# Patient Record
Sex: Male | Born: 1990 | Race: Black or African American | Hispanic: No | Marital: Single | Smoking: Former smoker
Health system: Southern US, Community
[De-identification: ages and names within clinical notes are randomized; demographics above are authoritative.]

## PROBLEM LIST (undated history)

## (undated) DIAGNOSIS — S143XXA Injury of brachial plexus, initial encounter: Secondary | ICD-10-CM

## (undated) DIAGNOSIS — Z9889 Other specified postprocedural states: Secondary | ICD-10-CM

## (undated) DIAGNOSIS — Z789 Other specified health status: Secondary | ICD-10-CM

## (undated) DIAGNOSIS — R112 Nausea with vomiting, unspecified: Secondary | ICD-10-CM

## (undated) DIAGNOSIS — S42101A Fracture of unspecified part of scapula, right shoulder, initial encounter for closed fracture: Secondary | ICD-10-CM

## (undated) HISTORY — PX: NO PAST SURGERIES: SHX2092

---

## 2015-04-10 ENCOUNTER — Emergency Department (HOSPITAL_COMMUNITY): Payer: Self-pay

## 2015-04-10 ENCOUNTER — Emergency Department (HOSPITAL_COMMUNITY)
Admission: EM | Admit: 2015-04-10 | Discharge: 2015-04-10 | Disposition: A | Discharge status: Home / Self Care | Payer: Self-pay | Attending: Emergency Medicine | Admitting: Emergency Medicine

## 2015-04-10 ENCOUNTER — Encounter (HOSPITAL_COMMUNITY): Payer: Self-pay | Admitting: Emergency Medicine

## 2015-04-10 DIAGNOSIS — S0101XA Laceration without foreign body of scalp, initial encounter: Secondary | ICD-10-CM | POA: Insufficient documentation

## 2015-04-10 DIAGNOSIS — R000 Tachycardia, unspecified: Secondary | ICD-10-CM | POA: Insufficient documentation

## 2015-04-10 DIAGNOSIS — IMO0002 Reserved for concepts with insufficient information to code with codable children: Secondary | ICD-10-CM

## 2015-04-10 DIAGNOSIS — Y9289 Other specified places as the place of occurrence of the external cause: Secondary | ICD-10-CM | POA: Insufficient documentation

## 2015-04-10 DIAGNOSIS — S40211A Abrasion of right shoulder, initial encounter: Secondary | ICD-10-CM | POA: Insufficient documentation

## 2015-04-10 DIAGNOSIS — Z23 Encounter for immunization: Secondary | ICD-10-CM | POA: Insufficient documentation

## 2015-04-10 DIAGNOSIS — Y9389 Activity, other specified: Secondary | ICD-10-CM | POA: Insufficient documentation

## 2015-04-10 DIAGNOSIS — F1210 Cannabis abuse, uncomplicated: Secondary | ICD-10-CM | POA: Insufficient documentation

## 2015-04-10 DIAGNOSIS — F17200 Nicotine dependence, unspecified, uncomplicated: Secondary | ICD-10-CM | POA: Insufficient documentation

## 2015-04-10 DIAGNOSIS — Y998 Other external cause status: Secondary | ICD-10-CM | POA: Insufficient documentation

## 2015-04-10 LAB — CBC WITH DIFFERENTIAL/PLATELET
BASOS PCT: 0 %
Basophils Absolute: 0 10*3/uL (ref 0.0–0.1)
EOS PCT: 1 %
Eosinophils Absolute: 0.2 10*3/uL (ref 0.0–0.7)
HEMATOCRIT: 47.7 % (ref 39.0–52.0)
Hemoglobin: 15.8 g/dL (ref 13.0–17.0)
Lymphocytes Relative: 34 %
Lymphs Abs: 5.4 10*3/uL — ABNORMAL HIGH (ref 0.7–4.0)
MCH: 32.4 pg (ref 26.0–34.0)
MCHC: 33.1 g/dL (ref 30.0–36.0)
MCV: 97.7 fL (ref 78.0–100.0)
MONO ABS: 0.8 10*3/uL (ref 0.1–1.0)
MONOS PCT: 5 %
NEUTROS ABS: 9.4 10*3/uL — AB (ref 1.7–7.7)
Neutrophils Relative %: 60 %
Platelets: 274 10*3/uL (ref 150–400)
RBC: 4.88 MIL/uL (ref 4.22–5.81)
RDW: 13.9 % (ref 11.5–15.5)
WBC: 15.9 10*3/uL — ABNORMAL HIGH (ref 4.0–10.5)

## 2015-04-10 LAB — TYPE AND SCREEN
ABO/RH(D): O POS
ANTIBODY SCREEN: NEGATIVE
UNIT DIVISION: 0
UNIT DIVISION: 0

## 2015-04-10 LAB — COMPREHENSIVE METABOLIC PANEL
ALBUMIN: 3.9 g/dL (ref 3.5–5.0)
ALK PHOS: 86 U/L (ref 38–126)
ALT: 22 U/L (ref 17–63)
AST: 37 U/L (ref 15–41)
Anion gap: 27 — ABNORMAL HIGH (ref 5–15)
BUN: 10 mg/dL (ref 6–20)
CALCIUM: 9.1 mg/dL (ref 8.9–10.3)
CHLORIDE: 98 mmol/L — AB (ref 101–111)
CO2: 14 mmol/L — AB (ref 22–32)
CREATININE: 1.41 mg/dL — AB (ref 0.61–1.24)
GFR calc non Af Amer: >60 mL/min (ref >60)
GLUCOSE: 129 mg/dL — AB (ref 65–99)
Potassium: 3.2 mmol/L — ABNORMAL LOW (ref 3.5–5.1)
Sodium: 139 mmol/L (ref 135–145)
Total Bilirubin: 0.6 mg/dL (ref 0.3–1.2)
Total Protein: 6.7 g/dL (ref 6.5–8.1)

## 2015-04-10 LAB — RAPID URINE DRUG SCREEN, HOSP PERFORMED
AMPHETAMINES: NOT DETECTED
BENZODIAZEPINES: NOT DETECTED
Barbiturates: NOT DETECTED
COCAINE: NOT DETECTED
OPIATES: NOT DETECTED
TETRAHYDROCANNABINOL: POSITIVE — AB

## 2015-04-10 LAB — URINALYSIS, ROUTINE W REFLEX MICROSCOPIC
BILIRUBIN URINE: NEGATIVE
GLUCOSE, UA: NEGATIVE mg/dL
Ketones, ur: NEGATIVE mg/dL
Leukocytes, UA: NEGATIVE
Nitrite: NEGATIVE
PROTEIN: NEGATIVE mg/dL
Specific Gravity, Urine: <1.005 — ABNORMAL LOW (ref 1.005–1.030)
pH: 5 (ref 5.0–8.0)

## 2015-04-10 LAB — URINE MICROSCOPIC-ADD ON

## 2015-04-10 LAB — PREPARE FRESH FROZEN PLASMA
Unit division: 0
Unit division: 0

## 2015-04-10 LAB — PROTIME-INR
INR: 1.21 (ref 0.00–1.49)
Prothrombin Time: 15.4 seconds — ABNORMAL HIGH (ref 11.6–15.2)

## 2015-04-10 LAB — I-STAT CG4 LACTIC ACID, ED
LACTIC ACID, VENOUS: 4.93 mmol/L — AB (ref 0.5–2.0)
Lactic Acid, Venous: 16.81 mmol/L (ref 0.5–2.0)

## 2015-04-10 LAB — BLOOD PRODUCT ORDER (VERBAL) VERIFICATION

## 2015-04-10 LAB — ETHANOL: Alcohol, Ethyl (B): 152 mg/dL — ABNORMAL HIGH (ref <5)

## 2015-04-10 LAB — CDS SEROLOGY

## 2015-04-10 LAB — ABO/RH: ABO/RH(D): O POS

## 2015-04-10 MED ORDER — TETANUS-DIPHTH-ACELL PERTUSSIS 5-2.5-18.5 LF-MCG/0.5 IM SUSP
0.5000 mL | Freq: Once | INTRAMUSCULAR | Status: AC
  Administered 2015-04-10: 0.5 mL via INTRAMUSCULAR

## 2015-04-10 MED ORDER — SODIUM CHLORIDE 0.9 % IV BOLUS (SEPSIS)
2000.0000 mL | Freq: Once | INTRAVENOUS | Status: AC
  Administered 2015-04-10: 2000 mL via INTRAVENOUS

## 2015-04-10 MED ORDER — TETANUS-DIPHTH-ACELL PERTUSSIS 5-2.5-18.5 LF-MCG/0.5 IM SUSP
INTRAMUSCULAR | Status: AC
  Filled 2015-04-10: qty 0.5
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
## 2015-04-10 NOTE — Progress Notes (Signed)
   04/10/15 0600  Clinical Encounter Type  Visit Type ED  Referral From Nurse  Chaplain paged to call family members. Explained to ED nurse, sec'y, and doctor that we had been instructed to ask medical staff to make these calls. Staff seemed somewhat disconcerted by this, so I assured them I would verify the policy and make sure I was correct. Secretary made call in presence of doctor.
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
## 2015-04-10 NOTE — ED Notes (Addendum)
Pt assisted to stand at bedside to use urinal. heartrate increased to 120, BP dropped to 108/ Pt steady on feet with standby assist.

## 2015-04-10 NOTE — ED Notes (Signed)
Patient transported to CT scan . 

## 2015-04-10 NOTE — ED Notes (Signed)
Pt states "I am ready to go"

## 2015-04-10 NOTE — ED Notes (Signed)
Dispatch contacted--- pt has no belongings at bedside. Message left with shift commander.

## 2015-04-10 NOTE — Discharge Instructions (Signed)
Laceration Care, Adult Chase Ayala, see your primary care doctor in 7-10 days to have your wound checked.  Return to the ED immediately for any worsening or concerns for infection.  Thank you.   A laceration is a cut that goes through all layers of the skin. The cut also goes into the tissue that is right under the skin. Some cuts heal on their own. Others need to be closed with stitches (sutures), staples, skin adhesive strips, or wound glue. Taking care of your cut lowers your risk of infection and helps your cut to heal better. HOW TO TAKE CARE OF YOUR CUT For stitches or staples:  Keep the wound clean and dry.  If you were given a bandage (dressing), you should change it at least one time per day or as told by your doctor. You should also change it if it gets wet or dirty.  Keep the wound completely dry for the first 24 hours or as told by your doctor. After that time, you may take a shower or a bath. However, make sure that the wound is not soaked in water until after the stitches or staples have been removed.  Clean the wound one time each day or as told by your doctor:  Wash the wound with soap and water.  Rinse the wound with water until all of the soap comes off.  Pat the wound dry with a clean towel. Do not rub the wound.  After you clean the wound, put a thin layer of antibiotic ointment on it as told by your doctor. This ointment:  Helps to prevent infection.  Keeps the bandage from sticking to the wound.  Have your stitches or staples removed as told by your doctor. If your doctor used skin adhesive strips:   Keep the wound clean and dry.  If you were given a bandage, you should change it at least one time per day or as told by your doctor. You should also change it if it gets dirty or wet.  Do not get the skin adhesive strips wet. You can take a shower or a bath, but be careful to keep the wound dry.  If the wound gets wet, pat it dry with a clean towel. Do not rub  the wound.  Skin adhesive strips fall off on their own. You can trim the strips as the wound heals. Do not remove any strips that are still stuck to the wound. They will fall off after a while. If your doctor used wound glue:  Try to keep your wound dry, but you may briefly wet it in the shower or bath. Do not soak the wound in water, such as by swimming.  After you take a shower or a bath, gently pat the wound dry with a clean towel. Do not rub the wound.  Do not do any activities that will make you really sweaty until the skin glue has fallen off on its own.  Do not apply liquid, cream, or ointment medicine to your wound while the skin glue is still on.  If you were given a bandage, you should change it at least one time per day or as told by your doctor. You should also change it if it gets dirty or wet.  If a bandage is placed over the wound, do not let the tape for the bandage touch the skin glue.  Do not pick at the glue. The skin glue usually stays on for 5-10 days. Then, it  falls off of the skin. General Instructions  To help prevent scarring, make sure to cover your wound with sunscreen whenever you are outside after stitches are removed, after adhesive strips are removed, or when wound glue stays in place and the wound is healed. Make sure to wear a sunscreen of at least 30 SPF.  Take over-the-counter and prescription medicines only as told by your doctor.  If you were given antibiotic medicine or ointment, take or apply it as told by your doctor. Do not stop using the antibiotic even if your wound is getting better.  Do not scratch or pick at the wound.  Keep all follow-up visits as told by your doctor. This is important.  Check your wound every day for signs of infection. Watch for:  Redness, swelling, or pain.  Fluid, blood, or pus.  Raise (elevate) the injured area above the level of your heart while you are sitting or lying down, if possible. GET HELP IF:  You  got a tetanus shot and you have any of these problems at the injection site:  Swelling.  Very bad pain.  Redness.  Bleeding.  You have a fever.  A wound that was closed breaks open.  You notice a bad smell coming from your wound or your bandage.  You notice something coming out of the wound, such as wood or glass.  Medicine does not help your pain.  You have more redness, swelling, or pain at the site of your wound.  You have fluid, blood, or pus coming from your wound.  You notice a change in the color of your skin near your wound.  You need to change the bandage often because fluid, blood, or pus is coming from the wound.  You start to have a new rash.  You start to have numbness around the wound. GET HELP RIGHT AWAY IF:  You have very bad swelling around the wound.  Your pain suddenly gets worse and is very bad.  You notice painful lumps near the wound or on skin that is anywhere on your body.  You have a red streak going away from your wound.  The wound is on your hand or foot and you cannot move a finger or toe like you usually can.  The wound is on your hand or foot and you notice that your fingers or toes look pale or bluish.   This information is not intended to replace advice given to you by your health care provider. Make sure you discuss any questions you have with your health care provider.   Document Released: 08/19/2007 Document Revised: 07/17/2014 Document Reviewed: 02/26/2014 Elsevier Interactive Patient Education [employer]

## 2015-04-10 NOTE — ED Notes (Signed)
Spoke with Billey Gosling from [facility] at [phone] regarding pt's belongings. Will call this nurse back with information.

## 2015-04-10 NOTE — ED Provider Notes (Signed)
9:18 AM Patient has some pain in upper extremities, xrays are negative. Got orthostatic when standing, will given another IV fluid bolus. Otherwise is mildly intoxicated but much improved. When sober will d/c.  Patient feels better. Is dizzy with standing but this seems likely related to concussion. D/c home  Pricilla Loveless, MD 04/10/15 [phone]

## 2015-04-17 ENCOUNTER — Encounter (HOSPITAL_COMMUNITY): Payer: Self-pay | Admitting: Emergency Medicine

## 2015-04-17 ENCOUNTER — Emergency Department (HOSPITAL_COMMUNITY)
Admission: EM | Admit: 2015-04-17 | Discharge: 2015-04-17 | Disposition: A | Discharge status: Home / Self Care | Payer: Self-pay | Attending: Emergency Medicine | Admitting: Emergency Medicine

## 2015-04-17 DIAGNOSIS — Z4802 Encounter for removal of sutures: Secondary | ICD-10-CM | POA: Insufficient documentation

## 2015-04-17 DIAGNOSIS — F17210 Nicotine dependence, cigarettes, uncomplicated: Secondary | ICD-10-CM | POA: Insufficient documentation

## 2015-04-17 NOTE — ED Notes (Signed)
Patient here for staple removal.   No problems.   No s/s of infection.

## 2015-04-17 NOTE — ED Provider Notes (Signed)
CSN: [id number]     Arrival date & time 04/17/15  0805 History   First MD Initiated Contact with Patient 04/17/15 [phone]     Chief Complaint  Patient presents with  . Suture / Staple Removal     (Consider location/radiation/quality/duration/timing/severity/associated sxs/prior Treatment) HPI   25 year old male presents requesting for staple removal. Patient reportedly was hit in the head with a metal stand approximate 6 days ago suffered a laceration to his scalp requiring surgical staples. Patient states he has been complaining the wound appropriately. He denies any increasing pain, fever, or increased swelling. No other complaint.  History reviewed. No pertinent past medical history. History reviewed. No pertinent past surgical history. No family history on file. Social History  Substance Use Topics  . Smoking status: Current Some Day Smoker -- 1.00 packs/day    Types: Cigarettes  . Smokeless tobacco: None  . Alcohol Use: Yes     Comment: socially    Review of Systems  Constitutional: Negative for fever.  Skin: Positive for wound.  Neurological: Negative for headaches.      Allergies  Review of patient's allergies indicates no known allergies.  Home Medications   Prior to Admission medications   Not on File   BP 143/66 mmHg  Pulse 84  Temp(Src) 98.1 F (36.7 C) (Oral)  Resp 16  Wt 85.276 kg  SpO2 99% Physical Exam  Constitutional: He appears well-developed and well-nourished. No distress.  HENT:  Head: Atraumatic.  Well healing wound to vertex of scalp with surgical staples in place.  nontender.  Eyes: Conjunctivae are normal.  Neck: Neck supple.  Neurological: He is alert.  Skin: No rash noted.  Psychiatric: He has a normal mood and affect.  Nursing note and vitals reviewed.   ED Course  Procedures (including critical care time) SUTURE REMOVAL Performed by: Fayrene Helper  Consent: Verbal consent obtained. Consent given by: patient Required items:  required blood products, implants, devices, and special equipment available Time out: Immediately prior to procedure a "time out" was called to verify the correct patient, procedure, equipment, support staff and site/side marked as required.  Location: vertex of scalp  Wound Appearance: clean  Sutures/Staples Removed: staples  Patient tolerance: Patient tolerated the procedure well with no immediate complications.     MDM   Final diagnoses:  Encounter for removal of staples    BP 143/66 mmHg  Pulse 84  Temp(Src) 98.1 F (36.7 C) (Oral)  Resp 16  Wt 85.276 kg  SpO2 99%     Fayrene Helper, PA-C 04/17/15 0901  Vanetta Mulders, MD 04/17/15 [phone]

## 2015-04-17 NOTE — Discharge Instructions (Signed)

## 2016-11-05 ENCOUNTER — Emergency Department (HOSPITAL_COMMUNITY): Payer: Self-pay

## 2016-11-05 ENCOUNTER — Inpatient Hospital Stay (HOSPITAL_COMMUNITY)
Admission: EM | Admit: 2016-11-05 | Discharge: 2016-12-15 | DRG: 003 | Disposition: A | Discharge status: Rehabilitation Facility | Payer: Self-pay | Attending: General Surgery | Admitting: General Surgery

## 2016-11-05 ENCOUNTER — Other Ambulatory Visit: Payer: Self-pay | Admitting: Otolaryngology

## 2016-11-05 ENCOUNTER — Inpatient Hospital Stay (HOSPITAL_COMMUNITY): Payer: Self-pay

## 2016-11-05 ENCOUNTER — Encounter (HOSPITAL_COMMUNITY): Payer: Self-pay | Admitting: Emergency Medicine

## 2016-11-05 DIAGNOSIS — B9620 Unspecified Escherichia coli [E. coli] as the cause of diseases classified elsewhere: Secondary | ICD-10-CM | POA: Diagnosis not present

## 2016-11-05 DIAGNOSIS — S0282XA Fracture of other specified skull and facial bones, left side, initial encounter for closed fracture: Secondary | ICD-10-CM | POA: Diagnosis present

## 2016-11-05 DIAGNOSIS — Y92410 Unspecified street and highway as the place of occurrence of the external cause: Secondary | ICD-10-CM

## 2016-11-05 DIAGNOSIS — B961 Klebsiella pneumoniae [K. pneumoniae] as the cause of diseases classified elsewhere: Secondary | ICD-10-CM | POA: Diagnosis not present

## 2016-11-05 DIAGNOSIS — R1110 Vomiting, unspecified: Secondary | ICD-10-CM

## 2016-11-05 DIAGNOSIS — R509 Fever, unspecified: Secondary | ICD-10-CM

## 2016-11-05 DIAGNOSIS — Z4659 Encounter for fitting and adjustment of other gastrointestinal appliance and device: Secondary | ICD-10-CM

## 2016-11-05 DIAGNOSIS — E2749 Other adrenocortical insufficiency: Secondary | ICD-10-CM | POA: Diagnosis present

## 2016-11-05 DIAGNOSIS — E876 Hypokalemia: Secondary | ICD-10-CM | POA: Diagnosis not present

## 2016-11-05 DIAGNOSIS — Y907 Blood alcohol level of 200-239 mg/100 ml: Secondary | ICD-10-CM | POA: Diagnosis present

## 2016-11-05 DIAGNOSIS — G8322 Monoplegia of upper limb affecting left dominant side: Secondary | ICD-10-CM | POA: Diagnosis present

## 2016-11-05 DIAGNOSIS — S0291XB Unspecified fracture of skull, initial encounter for open fracture: Secondary | ICD-10-CM | POA: Diagnosis present

## 2016-11-05 DIAGNOSIS — S06369A Traumatic hemorrhage of cerebrum, unspecified, with loss of consciousness of unspecified duration, initial encounter: Secondary | ICD-10-CM | POA: Diagnosis present

## 2016-11-05 DIAGNOSIS — R451 Restlessness and agitation: Secondary | ICD-10-CM | POA: Diagnosis not present

## 2016-11-05 DIAGNOSIS — E8770 Fluid overload, unspecified: Secondary | ICD-10-CM | POA: Diagnosis present

## 2016-11-05 DIAGNOSIS — T79A3XA Traumatic compartment syndrome of abdomen, initial encounter: Secondary | ICD-10-CM | POA: Diagnosis not present

## 2016-11-05 DIAGNOSIS — I609 Nontraumatic subarachnoid hemorrhage, unspecified: Secondary | ICD-10-CM

## 2016-11-05 DIAGNOSIS — S0081XA Abrasion of other part of head, initial encounter: Secondary | ICD-10-CM | POA: Diagnosis not present

## 2016-11-05 DIAGNOSIS — S0240CA Maxillary fracture, right side, initial encounter for closed fracture: Secondary | ICD-10-CM | POA: Diagnosis present

## 2016-11-05 DIAGNOSIS — S022XXA Fracture of nasal bones, initial encounter for closed fracture: Secondary | ICD-10-CM | POA: Diagnosis present

## 2016-11-05 DIAGNOSIS — J939 Pneumothorax, unspecified: Secondary | ICD-10-CM

## 2016-11-05 DIAGNOSIS — K651 Peritoneal abscess: Secondary | ICD-10-CM | POA: Diagnosis not present

## 2016-11-05 DIAGNOSIS — J942 Hemothorax: Secondary | ICD-10-CM

## 2016-11-05 DIAGNOSIS — J9690 Respiratory failure, unspecified, unspecified whether with hypoxia or hypercapnia: Secondary | ICD-10-CM

## 2016-11-05 DIAGNOSIS — T8149XA Infection following a procedure, other surgical site, initial encounter: Secondary | ICD-10-CM

## 2016-11-05 DIAGNOSIS — K559 Vascular disorder of intestine, unspecified: Secondary | ICD-10-CM | POA: Diagnosis not present

## 2016-11-05 DIAGNOSIS — T8143XA Infection following a procedure, organ and space surgical site, initial encounter: Secondary | ICD-10-CM

## 2016-11-05 DIAGNOSIS — J342 Deviated nasal septum: Secondary | ICD-10-CM | POA: Diagnosis present

## 2016-11-05 DIAGNOSIS — I493 Ventricular premature depolarization: Secondary | ICD-10-CM | POA: Diagnosis not present

## 2016-11-05 DIAGNOSIS — S020XXB Fracture of vault of skull, initial encounter for open fracture: Secondary | ICD-10-CM | POA: Diagnosis present

## 2016-11-05 DIAGNOSIS — R030 Elevated blood-pressure reading, without diagnosis of hypertension: Secondary | ICD-10-CM | POA: Diagnosis not present

## 2016-11-05 DIAGNOSIS — S272XXA Traumatic hemopneumothorax, initial encounter: Secondary | ICD-10-CM | POA: Diagnosis present

## 2016-11-05 DIAGNOSIS — R402432 Glasgow coma scale score 3-8, at arrival to emergency department: Secondary | ICD-10-CM | POA: Diagnosis present

## 2016-11-05 DIAGNOSIS — J9601 Acute respiratory failure with hypoxia: Secondary | ICD-10-CM | POA: Diagnosis not present

## 2016-11-05 DIAGNOSIS — K631 Perforation of intestine (nontraumatic): Secondary | ICD-10-CM | POA: Diagnosis present

## 2016-11-05 DIAGNOSIS — F419 Anxiety disorder, unspecified: Secondary | ICD-10-CM | POA: Diagnosis present

## 2016-11-05 DIAGNOSIS — S129XXA Fracture of neck, unspecified, initial encounter: Secondary | ICD-10-CM

## 2016-11-05 DIAGNOSIS — F1010 Alcohol abuse, uncomplicated: Secondary | ICD-10-CM | POA: Diagnosis present

## 2016-11-05 DIAGNOSIS — R1310 Dysphagia, unspecified: Secondary | ICD-10-CM | POA: Diagnosis present

## 2016-11-05 DIAGNOSIS — S299XXA Unspecified injury of thorax, initial encounter: Secondary | ICD-10-CM

## 2016-11-05 DIAGNOSIS — J398 Other specified diseases of upper respiratory tract: Secondary | ICD-10-CM

## 2016-11-05 DIAGNOSIS — S02600A Fracture of unspecified part of body of mandible, initial encounter for closed fracture: Secondary | ICD-10-CM | POA: Diagnosis present

## 2016-11-05 DIAGNOSIS — R188 Other ascites: Secondary | ICD-10-CM | POA: Diagnosis not present

## 2016-11-05 DIAGNOSIS — D62 Acute posthemorrhagic anemia: Secondary | ICD-10-CM | POA: Diagnosis not present

## 2016-11-05 DIAGNOSIS — R339 Retention of urine, unspecified: Secondary | ICD-10-CM | POA: Diagnosis not present

## 2016-11-05 DIAGNOSIS — L8990 Pressure ulcer of unspecified site, unspecified stage: Secondary | ICD-10-CM | POA: Insufficient documentation

## 2016-11-05 DIAGNOSIS — S0281XA Fracture of other specified skull and facial bones, right side, initial encounter for closed fracture: Secondary | ICD-10-CM | POA: Diagnosis present

## 2016-11-05 DIAGNOSIS — S143XXA Injury of brachial plexus, initial encounter: Secondary | ICD-10-CM | POA: Diagnosis present

## 2016-11-05 DIAGNOSIS — D6489 Other specified anemias: Secondary | ICD-10-CM | POA: Diagnosis present

## 2016-11-05 DIAGNOSIS — S065X3A Traumatic subdural hemorrhage with loss of consciousness of 1 hour to 5 hours 59 minutes, initial encounter: Principal | ICD-10-CM | POA: Diagnosis present

## 2016-11-05 DIAGNOSIS — Z0189 Encounter for other specified special examinations: Secondary | ICD-10-CM

## 2016-11-05 DIAGNOSIS — S066X9A Traumatic subarachnoid hemorrhage with loss of consciousness of unspecified duration, initial encounter: Secondary | ICD-10-CM | POA: Diagnosis present

## 2016-11-05 DIAGNOSIS — K567 Ileus, unspecified: Secondary | ICD-10-CM | POA: Diagnosis not present

## 2016-11-05 DIAGNOSIS — G9389 Other specified disorders of brain: Secondary | ICD-10-CM | POA: Diagnosis present

## 2016-11-05 DIAGNOSIS — F17210 Nicotine dependence, cigarettes, uncomplicated: Secondary | ICD-10-CM | POA: Diagnosis present

## 2016-11-05 DIAGNOSIS — S42101A Fracture of unspecified part of scapula, right shoulder, initial encounter for closed fracture: Secondary | ICD-10-CM | POA: Diagnosis present

## 2016-11-05 DIAGNOSIS — S2241XA Multiple fractures of ribs, right side, initial encounter for closed fracture: Secondary | ICD-10-CM | POA: Diagnosis present

## 2016-11-05 DIAGNOSIS — S12600A Unspecified displaced fracture of seventh cervical vertebra, initial encounter for closed fracture: Secondary | ICD-10-CM | POA: Diagnosis present

## 2016-11-05 DIAGNOSIS — S0219XA Other fracture of base of skull, initial encounter for closed fracture: Secondary | ICD-10-CM | POA: Diagnosis present

## 2016-11-05 DIAGNOSIS — Z23 Encounter for immunization: Secondary | ICD-10-CM

## 2016-11-05 HISTORY — DX: Injury of brachial plexus, initial encounter: S14.3XXA

## 2016-11-05 HISTORY — DX: Fracture of unspecified part of scapula, right shoulder, initial encounter for closed fracture: S42.101A

## 2016-11-05 LAB — CBC
HEMATOCRIT: 48.5 % (ref 39.0–52.0)
HEMOGLOBIN: 16.3 g/dL (ref 13.0–17.0)
MCH: 32.5 pg (ref 26.0–34.0)
MCHC: 33.6 g/dL (ref 30.0–36.0)
MCV: 96.8 fL (ref 78.0–100.0)
Platelets: 227 10*3/uL (ref 150–400)
RBC: 5.01 MIL/uL (ref 4.22–5.81)
RDW: 14.5 % (ref 11.5–15.5)
WBC: 11.9 10*3/uL — AB (ref 4.0–10.5)

## 2016-11-05 LAB — URINALYSIS, ROUTINE W REFLEX MICROSCOPIC
BILIRUBIN URINE: NEGATIVE
Bacteria, UA: NONE SEEN
GLUCOSE, UA: 50 mg/dL — AB
Ketones, ur: NEGATIVE mg/dL
Leukocytes, UA: NEGATIVE
NITRITE: NEGATIVE
PROTEIN: 100 mg/dL — AB
SPECIFIC GRAVITY, URINE: 1.02 (ref 1.005–1.030)
Squamous Epithelial / LPF: NONE SEEN
pH: 7 (ref 5.0–8.0)

## 2016-11-05 LAB — COMPREHENSIVE METABOLIC PANEL
ALT: 110 U/L — ABNORMAL HIGH (ref 17–63)
ANION GAP: 14 (ref 5–15)
AST: 144 U/L — ABNORMAL HIGH (ref 15–41)
Albumin: 3.8 g/dL (ref 3.5–5.0)
Alkaline Phosphatase: 63 U/L (ref 38–126)
BILIRUBIN TOTAL: 1 mg/dL (ref 0.3–1.2)
BUN: 8 mg/dL (ref 6–20)
CO2: 21 mmol/L — ABNORMAL LOW (ref 22–32)
Calcium: 8.5 mg/dL — ABNORMAL LOW (ref 8.9–10.3)
Chloride: 104 mmol/L (ref 101–111)
Creatinine, Ser: 1.13 mg/dL (ref 0.61–1.24)
Glucose, Bld: 152 mg/dL — ABNORMAL HIGH (ref 65–99)
POTASSIUM: 3.3 mmol/L — AB (ref 3.5–5.1)
Sodium: 139 mmol/L (ref 135–145)
TOTAL PROTEIN: 6.8 g/dL (ref 6.5–8.1)

## 2016-11-05 LAB — TRIGLYCERIDES: Triglycerides: 209 mg/dL — ABNORMAL HIGH (ref <150)

## 2016-11-05 LAB — PREPARE FRESH FROZEN PLASMA
UNIT DIVISION: 0
Unit division: 0

## 2016-11-05 LAB — BPAM FFP
BLOOD PRODUCT EXPIRATION DATE: 201808262359
Blood Product Expiration Date: 201808262359
ISSUE DATE / TIME: 201808230349
ISSUE DATE / TIME: 201808230349
UNIT TYPE AND RH: 6200
UNIT TYPE AND RH: 6200

## 2016-11-05 LAB — I-STAT CHEM 8, ED
BUN: 9 mg/dL (ref 6–20)
CALCIUM ION: 1 mmol/L — AB (ref 1.15–1.40)
CREATININE: 1.2 mg/dL (ref 0.61–1.24)
Chloride: 103 mmol/L (ref 101–111)
GLUCOSE: 150 mg/dL — AB (ref 65–99)
HCT: 51 % (ref 39.0–52.0)
Hemoglobin: 17.3 g/dL — ABNORMAL HIGH (ref 13.0–17.0)
Potassium: 3.3 mmol/L — ABNORMAL LOW (ref 3.5–5.1)
SODIUM: 141 mmol/L (ref 135–145)
TCO2: 25 mmol/L (ref 0–100)

## 2016-11-05 LAB — CBC WITH DIFFERENTIAL/PLATELET
Basophils Absolute: 0 10*3/uL (ref 0.0–0.1)
Basophils Relative: 0 %
Eosinophils Absolute: 0 10*3/uL (ref 0.0–0.7)
Eosinophils Relative: 0 %
HEMATOCRIT: 35.1 % — AB (ref 39.0–52.0)
Hemoglobin: 11.4 g/dL — ABNORMAL LOW (ref 13.0–17.0)
LYMPHS PCT: 13 %
Lymphs Abs: 1.2 10*3/uL (ref 0.7–4.0)
MCH: 31.2 pg (ref 26.0–34.0)
MCHC: 32.5 g/dL (ref 30.0–36.0)
MCV: 96.2 fL (ref 78.0–100.0)
MONO ABS: 0.6 10*3/uL (ref 0.1–1.0)
MONOS PCT: 7 %
NEUTROS ABS: 7.5 10*3/uL (ref 1.7–7.7)
Neutrophils Relative %: 80 %
Platelets: 162 10*3/uL (ref 150–400)
RBC: 3.65 MIL/uL — ABNORMAL LOW (ref 4.22–5.81)
RDW: 14.7 % (ref 11.5–15.5)
WBC: 9.3 10*3/uL (ref 4.0–10.5)

## 2016-11-05 LAB — POCT I-STAT 3, ART BLOOD GAS (G3+)
Acid-base deficit: 6 mmol/L — ABNORMAL HIGH (ref 0.0–2.0)
BICARBONATE: 20.1 mmol/L (ref 20.0–28.0)
O2 Saturation: 95 %
PCO2 ART: 42.5 mmHg (ref 32.0–48.0)
Patient temperature: 37.9
TCO2: 21 mmol/L (ref 0–100)
pH, Arterial: 7.287 — ABNORMAL LOW (ref 7.350–7.450)
pO2, Arterial: 89 mmHg (ref 83.0–108.0)

## 2016-11-05 LAB — I-STAT ARTERIAL BLOOD GAS, ED
Acid-base deficit: 3 mmol/L — ABNORMAL HIGH (ref 0.0–2.0)
Bicarbonate: 23.7 mmol/L (ref 20.0–28.0)
O2 Saturation: 96 %
PCO2 ART: 46.9 mmHg (ref 32.0–48.0)
PO2 ART: 90 mmHg (ref 83.0–108.0)
Patient temperature: 97.8
TCO2: 25 mmol/L (ref 0–100)
pH, Arterial: 7.31 — ABNORMAL LOW (ref 7.350–7.450)

## 2016-11-05 LAB — TYPE AND SCREEN
ABO/RH(D): O POS
ANTIBODY SCREEN: NEGATIVE
UNIT DIVISION: 0
Unit division: 0

## 2016-11-05 LAB — CDS SEROLOGY

## 2016-11-05 LAB — LACTIC ACID, PLASMA: Lactic Acid, Venous: 2.7 mmol/L (ref 0.5–1.9)

## 2016-11-05 LAB — HIV ANTIBODY (ROUTINE TESTING W REFLEX): HIV Screen 4th Generation wRfx: NONREACTIVE

## 2016-11-05 LAB — PROTIME-INR
INR: 1.15
Prothrombin Time: 14.7 seconds (ref 11.4–15.2)

## 2016-11-05 LAB — BPAM RBC
BLOOD PRODUCT EXPIRATION DATE: 201809172359
Blood Product Expiration Date: 201809112359
ISSUE DATE / TIME: 201808230348
ISSUE DATE / TIME: 201808230348
UNIT TYPE AND RH: 9500
UNIT TYPE AND RH: 9500

## 2016-11-05 LAB — I-STAT CG4 LACTIC ACID, ED: LACTIC ACID, VENOUS: 4.63 mmol/L — AB (ref 0.5–1.9)

## 2016-11-05 LAB — BLOOD PRODUCT ORDER (VERBAL) VERIFICATION

## 2016-11-05 LAB — ABO/RH: ABO/RH(D): O POS

## 2016-11-05 LAB — ETHANOL: ALCOHOL ETHYL (B): 206 mg/dL — AB (ref <5)

## 2016-11-05 MED ORDER — ORAL CARE MOUTH RINSE
15.0000 mL | OROMUCOSAL | Status: DC
  Administered 2016-11-05 – 2016-12-08 (×324): 15 mL via OROMUCOSAL

## 2016-11-05 MED ORDER — CHLORHEXIDINE GLUCONATE 0.12% ORAL RINSE (MEDLINE KIT)
15.0000 mL | Freq: Two times a day (BID) | OROMUCOSAL | Status: DC
  Administered 2016-11-05 – 2016-12-08 (×66): 15 mL via OROMUCOSAL

## 2016-11-05 MED ORDER — ALBUMIN HUMAN 5 % IV SOLN
25.0000 g | Freq: Once | INTRAVENOUS | Status: AC
  Administered 2016-11-05: 25 g via INTRAVENOUS

## 2016-11-05 MED ORDER — PROPOFOL 1000 MG/100ML IV EMUL
0.0000 ug/kg/min | INTRAVENOUS | Status: DC
  Administered 2016-11-05: 25 ug/kg/min via INTRAVENOUS
  Administered 2016-11-05: 15 ug/kg/min via INTRAVENOUS
  Administered 2016-11-05: 25 ug/kg/min via INTRAVENOUS
  Administered 2016-11-06: 30 ug/kg/min via INTRAVENOUS
  Administered 2016-11-06: 50 ug/kg/min via INTRAVENOUS
  Administered 2016-11-06: 25 ug/kg/min via INTRAVENOUS
  Administered 2016-11-06: 20 ug/kg/min via INTRAVENOUS
  Administered 2016-11-07: 35 ug/kg/min via INTRAVENOUS
  Administered 2016-11-07: 40 ug/kg/min via INTRAVENOUS
  Administered 2016-11-07: 30 ug/kg/min via INTRAVENOUS
  Administered 2016-11-07: 35 ug/kg/min via INTRAVENOUS
  Administered 2016-11-08: 40 ug/kg/min via INTRAVENOUS
  Administered 2016-11-08: 35 ug/kg/min via INTRAVENOUS
  Administered 2016-11-08 – 2016-11-09 (×5): 50 ug/kg/min via INTRAVENOUS
  Administered 2016-11-09: 40 ug/kg/min via INTRAVENOUS
  Administered 2016-11-09 (×2): 50 ug/kg/min via INTRAVENOUS
  Administered 2016-11-10 (×2): 40 ug/kg/min via INTRAVENOUS
  Administered 2016-11-10: 50 ug/kg/min via INTRAVENOUS
  Administered 2016-11-10: 40.059 ug/kg/min via INTRAVENOUS
  Administered 2016-11-10: 50 ug/kg/min via INTRAVENOUS
  Administered 2016-11-10: 40 ug/kg/min via INTRAVENOUS
  Administered 2016-11-11: 30 ug/kg/min via INTRAVENOUS
  Administered 2016-11-11 (×2): 40 ug/kg/min via INTRAVENOUS
  Administered 2016-11-11: 30 ug/kg/min via INTRAVENOUS
  Administered 2016-11-12 (×2): 35 ug/kg/min via INTRAVENOUS
  Administered 2016-11-12 – 2016-11-13 (×2): 40 ug/kg/min via INTRAVENOUS
  Administered 2016-11-13: 30 ug/kg/min via INTRAVENOUS
  Administered 2016-11-13 – 2016-11-14 (×3): 40 ug/kg/min via INTRAVENOUS
  Administered 2016-11-14: 30 ug/kg/min via INTRAVENOUS
  Administered 2016-11-14: 40 ug/kg/min via INTRAVENOUS
  Administered 2016-11-14 – 2016-11-15 (×2): 30 ug/kg/min via INTRAVENOUS
  Administered 2016-11-15: 50 ug/kg/min via INTRAVENOUS
  Administered 2016-11-15: 35 ug/kg/min via INTRAVENOUS
  Administered 2016-11-15: 40 ug/kg/min via INTRAVENOUS
  Administered 2016-11-15: 50 ug/kg/min via INTRAVENOUS
  Administered 2016-11-15: 30 ug/kg/min via INTRAVENOUS
  Administered 2016-11-16 – 2016-11-17 (×11): 50 ug/kg/min via INTRAVENOUS
  Filled 2016-11-05 (×64): qty 100

## 2016-11-05 MED ORDER — FENTANYL 2500MCG IN NS 250ML (10MCG/ML) PREMIX INFUSION
25.0000 ug/h | INTRAVENOUS | Status: DC
  Administered 2016-11-05: 25 ug/h via INTRAVENOUS
  Administered 2016-11-06: 50 ug/h via INTRAVENOUS
  Administered 2016-11-07: 175 ug/h via INTRAVENOUS
  Administered 2016-11-07 – 2016-11-08 (×3): 200 ug/h via INTRAVENOUS
  Administered 2016-11-09: 225 ug/h via INTRAVENOUS
  Administered 2016-11-09: 275 ug/h via INTRAVENOUS
  Administered 2016-11-09: 200 ug/h via INTRAVENOUS
  Administered 2016-11-10: 300 ug/h via INTRAVENOUS
  Administered 2016-11-10: 250 ug/h via INTRAVENOUS
  Administered 2016-11-10: 300 ug/h via INTRAVENOUS
  Administered 2016-11-11 (×2): 250 ug/h via INTRAVENOUS
  Administered 2016-11-11: 300 ug/h via INTRAVENOUS
  Administered 2016-11-12 – 2016-11-13 (×3): 250 ug/h via INTRAVENOUS
  Administered 2016-11-13: 200 ug/h via INTRAVENOUS
  Administered 2016-11-13: 250 ug/h via INTRAVENOUS
  Administered 2016-11-14: 200 ug/h via INTRAVENOUS
  Administered 2016-11-14 – 2016-11-16 (×8): 300 ug/h via INTRAVENOUS
  Administered 2016-11-17: 400 ug/h via INTRAVENOUS
  Administered 2016-11-17: 300 ug/h via INTRAVENOUS
  Administered 2016-11-17: 350 ug/h via INTRAVENOUS
  Administered 2016-11-18 (×3): 400 ug/h via INTRAVENOUS
  Administered 2016-11-18: 200 ug/h via INTRAVENOUS
  Administered 2016-11-19: 400 ug/h via INTRAVENOUS
  Administered 2016-11-19: 350 ug/h via INTRAVENOUS
  Administered 2016-11-20: 400 ug/h via INTRAVENOUS
  Administered 2016-11-20: 300 ug/h via INTRAVENOUS
  Administered 2016-11-20 – 2016-11-28 (×30): 400 ug/h via INTRAVENOUS
  Administered 2016-11-28: 40 ug/h via INTRAVENOUS
  Administered 2016-11-28 – 2016-11-29 (×5): 400 ug/h via INTRAVENOUS
  Administered 2016-11-30: 350 ug/h via INTRAVENOUS
  Administered 2016-11-30 – 2016-12-03 (×13): 400 ug/h via INTRAVENOUS
  Administered 2016-12-04: 275 ug/h via INTRAVENOUS
  Administered 2016-12-04: 400 ug/h via INTRAVENOUS
  Administered 2016-12-04: 325 ug/h via INTRAVENOUS
  Administered 2016-12-04: 400 ug/h via INTRAVENOUS
  Administered 2016-12-05: 275 ug/h via INTRAVENOUS
  Administered 2016-12-05: 250 ug/h via INTRAVENOUS
  Administered 2016-12-06: 200 ug/h via INTRAVENOUS
  Administered 2016-12-06: 400 ug/h via INTRAVENOUS
  Administered 2016-12-06: 300 ug/h via INTRAVENOUS
  Administered 2016-12-07 (×4): 400 ug/h via INTRAVENOUS
  Administered 2016-12-08: 325 ug/h via INTRAVENOUS
  Administered 2016-12-08: 350 ug/h via INTRAVENOUS
  Administered 2016-12-08: 400 ug/h via INTRAVENOUS
  Administered 2016-12-08: 325 ug/h via INTRAVENOUS
  Administered 2016-12-09: 150 ug/h via INTRAVENOUS
  Administered 2016-12-09: 225 ug/h via INTRAVENOUS
  Filled 2016-11-05 (×113): qty 250

## 2016-11-05 MED ORDER — FENTANYL BOLUS VIA INFUSION: 50.0000 ug | INTRAVENOUS | Status: DC | PRN

## 2016-11-05 MED ORDER — ETOMIDATE 2 MG/ML IV SOLN
INTRAVENOUS | Status: AC | PRN
  Administered 2016-11-05: 30 mg via INTRAVENOUS

## 2016-11-05 MED ORDER — FENTANYL CITRATE (PF) 100 MCG/2ML IJ SOLN: 50.0000 ug | Freq: Once | INTRAMUSCULAR | Status: DC

## 2016-11-05 MED ORDER — TETANUS-DIPHTH-ACELL PERTUSSIS 5-2.5-18.5 LF-MCG/0.5 IM SUSP
INTRAMUSCULAR | Status: AC
  Filled 2016-11-05: qty 0.5

## 2016-11-05 MED ORDER — IOPAMIDOL (ISOVUE-370) INJECTION 76%
INTRAVENOUS | Status: AC
  Administered 2016-11-05: 50 mL
  Filled 2016-11-05: qty 50

## 2016-11-05 MED ORDER — BACITRACIN ZINC 500 UNIT/GM EX OINT
TOPICAL_OINTMENT | Freq: Two times a day (BID) | CUTANEOUS | Status: DC
  Administered 2016-11-05 (×2): via TOPICAL
  Administered 2016-11-06 – 2016-11-07 (×3): 1 via TOPICAL
  Administered 2016-11-07 – 2016-11-10 (×5): via TOPICAL
  Administered 2016-11-10: 1 via TOPICAL
  Administered 2016-11-11: 10:00:00 via TOPICAL
  Administered 2016-11-11 – 2016-11-12 (×2): 1 via TOPICAL
  Administered 2016-11-12 – 2016-11-13 (×4): via TOPICAL
  Administered 2016-11-14 – 2016-11-15 (×4): 1 via TOPICAL
  Administered 2016-11-16 – 2016-11-17 (×4): via TOPICAL
  Administered 2016-11-18: 1 via TOPICAL
  Administered 2016-11-18 – 2016-11-19 (×2): via TOPICAL
  Administered 2016-11-19: 1 via TOPICAL
  Administered 2016-11-20: 09:00:00 via TOPICAL
  Administered 2016-11-20: 1 via TOPICAL
  Administered 2016-11-21 – 2016-11-22 (×3): via TOPICAL
  Administered 2016-11-22: 2 via TOPICAL
  Administered 2016-11-23 (×2): via TOPICAL
  Administered 2016-11-24 – 2016-11-25 (×4): 1 via TOPICAL
  Administered 2016-11-26 (×2): via TOPICAL
  Administered 2016-11-27: 1 via TOPICAL
  Administered 2016-11-27 – 2016-11-28 (×2): via TOPICAL
  Administered 2016-11-28 – 2016-11-29 (×2): 1 via TOPICAL
  Administered 2016-11-29 – 2016-12-05 (×13): via TOPICAL
  Administered 2016-12-06: 1 via TOPICAL
  Administered 2016-12-07 – 2016-12-08 (×3): via TOPICAL
  Administered 2016-12-08 – 2016-12-09 (×2): 1 via TOPICAL
  Administered 2016-12-09 – 2016-12-10 (×2): via TOPICAL
  Administered 2016-12-10: 1 via TOPICAL
  Administered 2016-12-12 – 2016-12-15 (×6): via TOPICAL
  Filled 2016-11-05 (×4): qty 28.35

## 2016-11-05 MED ORDER — SODIUM CHLORIDE 0.9 % IV SOLN: INTRAVENOUS | Status: DC | PRN

## 2016-11-05 MED ORDER — PANTOPRAZOLE SODIUM 40 MG PO TBEC: 40.0000 mg | DELAYED_RELEASE_TABLET | Freq: Every day | ORAL | Status: DC

## 2016-11-05 MED ORDER — SODIUM CHLORIDE 0.9 % IV BOLUS (SEPSIS)
1000.0000 mL | Freq: Once | INTRAVENOUS | Status: AC
  Administered 2016-11-05: 1000 mL via INTRAVENOUS

## 2016-11-05 MED ORDER — CEFAZOLIN SODIUM-DEXTROSE 2-4 GM/100ML-% IV SOLN
2.0000 g | Freq: Once | INTRAVENOUS | Status: AC
  Administered 2016-11-05: 2 g via INTRAVENOUS

## 2016-11-05 MED ORDER — FENTANYL CITRATE (PF) 100 MCG/2ML IJ SOLN
INTRAMUSCULAR | Status: AC | PRN
  Administered 2016-11-05: 50 ug via INTRAVENOUS

## 2016-11-05 MED ORDER — FENTANYL 2500MCG IN NS 250ML (10MCG/ML) PREMIX INFUSION: 25.0000 ug/h | INTRAVENOUS | Status: DC

## 2016-11-05 MED ORDER — SENNOSIDES 8.8 MG/5ML PO SYRP
5.0000 mL | ORAL_SOLUTION | Freq: Two times a day (BID) | ORAL | Status: DC | PRN
  Administered 2016-11-09: 5 mL
  Filled 2016-11-05 (×2): qty 5

## 2016-11-05 MED ORDER — PANTOPRAZOLE SODIUM 40 MG IV SOLR
40.0000 mg | Freq: Every day | INTRAVENOUS | Status: DC
  Administered 2016-11-05 – 2016-11-07 (×3): 40 mg via INTRAVENOUS
  Filled 2016-11-05 (×3): qty 40

## 2016-11-05 MED ORDER — ROCURONIUM BROMIDE 50 MG/5ML IV SOLN
INTRAVENOUS | Status: AC | PRN
  Administered 2016-11-05: 100 mg via INTRAVENOUS

## 2016-11-05 MED ORDER — PROPOFOL 10 MG/ML IV BOLUS
INTRAVENOUS | Status: AC | PRN
  Administered 2016-11-05: 20 mg via INTRAVENOUS

## 2016-11-05 MED ORDER — SODIUM BICARBONATE 8.4 % IV SOLN
50.0000 meq | Freq: Once | INTRAVENOUS | Status: AC
  Administered 2016-11-05: 50 meq via INTRAVENOUS
  Filled 2016-11-05: qty 50

## 2016-11-05 MED ORDER — POTASSIUM CHLORIDE IN NACL 20-0.9 MEQ/L-% IV SOLN
INTRAVENOUS | Status: AC
  Administered 2016-11-05 – 2016-11-19 (×29): via INTRAVENOUS
  Administered 2016-11-19: 1000 mL via INTRAVENOUS
  Administered 2016-11-19 – 2016-11-20 (×7): via INTRAVENOUS
  Administered 2016-11-20: 1000 mL via INTRAVENOUS
  Administered 2016-11-21 (×2): via INTRAVENOUS
  Administered 2016-11-21: 1000 mL via INTRAVENOUS
  Administered 2016-11-22 (×2): via INTRAVENOUS
  Filled 2016-11-05 (×58): qty 1000

## 2016-11-05 MED ORDER — FENTANYL CITRATE (PF) 100 MCG/2ML IJ SOLN
50.0000 ug | Freq: Once | INTRAMUSCULAR | Status: AC
  Administered 2016-11-05: 50 ug via INTRAVENOUS

## 2016-11-05 MED ORDER — ALBUMIN HUMAN 5 % IV SOLN
INTRAVENOUS | Status: AC
Start: 2016-11-05 — End: 2016-11-05
  Filled 2016-11-05: qty 500

## 2016-11-05 MED ORDER — TETANUS-DIPHTH-ACELL PERTUSSIS 5-2.5-18.5 LF-MCG/0.5 IM SUSP
0.5000 mL | Freq: Once | INTRAMUSCULAR | Status: AC
  Administered 2016-11-05: 0.5 mL via INTRAMUSCULAR

## 2016-11-05 MED ORDER — MIDAZOLAM HCL 2 MG/2ML IJ SOLN: 2.0000 mg | INTRAMUSCULAR | Status: DC | PRN

## 2016-11-05 MED ORDER — FENTANYL CITRATE (PF) 100 MCG/2ML IJ SOLN
INTRAMUSCULAR | Status: AC
  Filled 2016-11-05: qty 2

## 2016-11-05 MED ORDER — PROPOFOL 1000 MG/100ML IV EMUL
INTRAVENOUS | Status: AC
  Administered 2016-11-05: 5 mg/kg/h
  Filled 2016-11-05: qty 100

## 2016-11-05 MED ORDER — LIDOCAINE-EPINEPHRINE (PF) 2 %-1:200000 IJ SOLN
10.0000 mL | Freq: Once | INTRAMUSCULAR | Status: AC
  Administered 2016-11-05: 10 mL via INTRADERMAL
  Filled 2016-11-05: qty 20

## 2016-11-05 MED ORDER — MIDAZOLAM HCL 2 MG/2ML IJ SOLN
2.0000 mg | INTRAMUSCULAR | Status: DC | PRN
  Administered 2016-11-15 – 2016-11-17 (×3): 2 mg via INTRAVENOUS
  Filled 2016-11-05 (×3): qty 2

## 2016-11-05 MED ORDER — ACETAMINOPHEN 160 MG/5ML PO SOLN
650.0000 mg | Freq: Four times a day (QID) | ORAL | Status: DC | PRN
  Administered 2016-11-10 – 2016-11-16 (×11): 650 mg
  Filled 2016-11-05 (×13): qty 20.3

## 2016-11-05 MED ORDER — CEFAZOLIN SODIUM-DEXTROSE 2-4 GM/100ML-% IV SOLN
INTRAVENOUS | Status: AC
  Filled 2016-11-05: qty 100

## 2016-11-05 MED ORDER — FENTANYL BOLUS VIA INFUSION
50.0000 ug | INTRAVENOUS | Status: DC | PRN
  Administered 2016-11-10 – 2016-12-06 (×31): 50 ug via INTRAVENOUS
  Filled 2016-11-05 (×2): qty 50

## 2016-11-05 MED ORDER — IOPAMIDOL (ISOVUE-300) INJECTION 61%
INTRAVENOUS | Status: AC
  Administered 2016-11-05: 100 mL
  Filled 2016-11-05: qty 100
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
## 2016-11-05 NOTE — Procedures (Signed)
Chest Tube Insertion Procedure Note  Indications:  Clinically significant Pneumothorax and Hemothorax  Pre-operative Diagnosis: Pneumothorax and Hemothorax  Post-operative Diagnosis: Pneumothorax and Hemothorax  Procedure Details  Informed consent was obtained for the procedure, including sedation.  Risks of lung perforation, hemorrhage, arrhythmia, and adverse drug reaction were discussed.   After sterile skin prep, using standard technique, a 14 French tube was placed in the right anterolateral 5th rib space.  Findings: 200 ml of bloody fluid obtained  Estimated Blood Loss:  200 mL         Specimens:  None              Complications:  None; patient tolerated the procedure well.         Disposition: Remained in the [facility]uma [facility] until disposition to ICU         Condition: stable  Attending Attestation: I performed the procedure.  Marta Lamas. Gae Bon, MD, FACS [phone] Trauma Surgeon

## 2016-11-05 NOTE — Progress Notes (Signed)
Orthopedic Tech Progress Note Patient Details:  Chase Ayala September 06, 1990 [id number]  Ortho Devices Type of Ortho Device: Arm sling Ortho Device/Splint Location: rue Ortho Device/Splint Interventions: Application   Elige Shouse 11/05/2016, 11:17 AM

## 2016-11-05 NOTE — Progress Notes (Signed)
   Family (cousins) currently in ED Consult B  Mother en-route.  Will follow, as needed.

## 2016-11-05 NOTE — Procedures (Signed)
Arterial Catheter Insertion Procedure Note Chase Ayala [id number] 1990-05-29  Procedure: Insertion of Arterial Catheter  Indications: Blood pressure monitoring and Frequent blood sampling  Procedure Details Consent: Unable to obtain consent because of emergent medical necessity. Time Out: Verified patient identification, verified procedure, site/side was marked, verified correct patient position, special equipment/implants available, medications/allergies/relevent history reviewed, required imaging and test results available.  Performed  Maximum sterile technique was used including antiseptics, cap, gloves, gown, hand hygiene, mask and sheet. Skin prep: Chlorhexidine; local anesthetic administered 20 gauge catheter was inserted into left radial artery using the Seldinger technique.  Evaluation Blood flow good; BP tracing good. Complications: No apparent complications. RT placed arrow catheter on first attempt.  Morley Kos 11/05/2016
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
## 2016-11-05 NOTE — Progress Notes (Signed)
Patient transported from ED to 4N30 without complications. Vital signs stable throughout. RN at bedside. RT will continue to monitor.[name]

## 2016-11-05 NOTE — Plan of Care (Signed)
Problem: Respiratory: Goal: Ability to maintain a clear airway and adequate ventilation will improve Outcome: Progressing For trach and facial/jaw fixation 8/24.
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
## 2016-11-05 NOTE — ED Notes (Signed)
Family at bedside. 

## 2016-11-06 ENCOUNTER — Inpatient Hospital Stay (HOSPITAL_COMMUNITY): Payer: Self-pay | Admitting: Certified Registered"

## 2016-11-06 ENCOUNTER — Encounter (HOSPITAL_COMMUNITY): Admission: EM | Disposition: A | Discharge status: Rehabilitation Facility | Payer: Self-pay | Source: Home / Self Care

## 2016-11-06 ENCOUNTER — Inpatient Hospital Stay (HOSPITAL_COMMUNITY): Payer: Self-pay

## 2016-11-06 ENCOUNTER — Encounter (HOSPITAL_COMMUNITY): Payer: Self-pay | Admitting: *Deleted

## 2016-11-06 DIAGNOSIS — Z930 Tracheostomy status: Secondary | ICD-10-CM

## 2016-11-06 DIAGNOSIS — Z9689 Presence of other specified functional implants: Secondary | ICD-10-CM

## 2016-11-06 DIAGNOSIS — S270XXS Traumatic pneumothorax, sequela: Secondary | ICD-10-CM

## 2016-11-06 DIAGNOSIS — J9601 Acute respiratory failure with hypoxia: Secondary | ICD-10-CM

## 2016-11-06 HISTORY — PX: ORIF MANDIBULAR FRACTURE: SHX2127

## 2016-11-06 HISTORY — PX: TRACHEOSTOMY TUBE PLACEMENT: SHX814

## 2016-11-06 LAB — COMPREHENSIVE METABOLIC PANEL
ALK PHOS: 36 U/L — AB (ref 38–126)
ALT: 68 U/L — AB (ref 17–63)
ANION GAP: 8 (ref 5–15)
AST: 87 U/L — ABNORMAL HIGH (ref 15–41)
Albumin: 2.8 g/dL — ABNORMAL LOW (ref 3.5–5.0)
BILIRUBIN TOTAL: 0.7 mg/dL (ref 0.3–1.2)
BUN: 11 mg/dL (ref 6–20)
CALCIUM: 8.2 mg/dL — AB (ref 8.9–10.3)
CO2: 25 mmol/L (ref 22–32)
CREATININE: 0.85 mg/dL (ref 0.61–1.24)
Chloride: 109 mmol/L (ref 101–111)
Glucose, Bld: 113 mg/dL — ABNORMAL HIGH (ref 65–99)
Potassium: 4 mmol/L (ref 3.5–5.1)
SODIUM: 142 mmol/L (ref 135–145)
TOTAL PROTEIN: 5.3 g/dL — AB (ref 6.5–8.1)

## 2016-11-06 LAB — BLOOD GAS, ARTERIAL
Acid-Base Excess: 1.3 mmol/L (ref 0.0–2.0)
Bicarbonate: 25.3 mmol/L (ref 20.0–28.0)
DRAWN BY: 225631
FIO2: 40
LHR: 18 {breaths}/min
O2 SAT: 97.1 %
PCO2 ART: 41.3 mmHg (ref 32.0–48.0)
PEEP: 10 cmH2O
PO2 ART: 99.3 mmHg (ref 83.0–108.0)
Patient temperature: 100.2
VT: 560 mL
pH, Arterial: 7.409 (ref 7.350–7.450)

## 2016-11-06 LAB — POCT I-STAT 7, (LYTES, BLD GAS, ICA,H+H)
Bicarbonate: 25.3 mmol/L (ref 20.0–28.0)
Calcium, Ion: 1.18 mmol/L (ref 1.15–1.40)
HCT: 30 % — ABNORMAL LOW (ref 39.0–52.0)
HEMOGLOBIN: 10.2 g/dL — AB (ref 13.0–17.0)
O2 Saturation: 96 %
PH ART: 7.353 (ref 7.350–7.450)
POTASSIUM: 4 mmol/L (ref 3.5–5.1)
SODIUM: 143 mmol/L (ref 135–145)
TCO2: 27 mmol/L (ref 22–32)
pCO2 arterial: 45.7 mmHg (ref 32.0–48.0)
pO2, Arterial: 86 mmHg (ref 83.0–108.0)

## 2016-11-06 LAB — CBC
HEMATOCRIT: 31.4 % — AB (ref 39.0–52.0)
HEMOGLOBIN: 10.4 g/dL — AB (ref 13.0–17.0)
MCH: 31.7 pg (ref 26.0–34.0)
MCHC: 33.1 g/dL (ref 30.0–36.0)
MCV: 95.7 fL (ref 78.0–100.0)
Platelets: 146 10*3/uL — ABNORMAL LOW (ref 150–400)
RBC: 3.28 MIL/uL — AB (ref 4.22–5.81)
RDW: 14.8 % (ref 11.5–15.5)
WBC: 8 10*3/uL (ref 4.0–10.5)

## 2016-11-06 LAB — LACTIC ACID, PLASMA: LACTIC ACID, VENOUS: 1 mmol/L (ref 0.5–1.9)

## 2016-11-06 LAB — PROTIME-INR
INR: 1.32
PROTHROMBIN TIME: 16.5 s — AB (ref 11.4–15.2)

## 2016-11-06 SURGERY — OPEN REDUCTION INTERNAL FIXATION (ORIF) MANDIBULAR FRACTURE
Anesthesia: General | Site: Mouth

## 2016-11-06 MED ORDER — EPINEPHRINE HCL (NASAL) 0.1 % NA SOLN
NASAL | Status: AC
  Filled 2016-11-06: qty 30

## 2016-11-06 MED ORDER — PHENYLEPHRINE 40 MCG/ML (10ML) SYRINGE FOR IV PUSH (FOR BLOOD PRESSURE SUPPORT)
PREFILLED_SYRINGE | INTRAVENOUS | Status: AC
  Filled 2016-11-06: qty 10

## 2016-11-06 MED ORDER — OXYMETAZOLINE HCL 0.05 % NA SOLN
NASAL | Status: DC | PRN
  Administered 2016-11-06: 1

## 2016-11-06 MED ORDER — CEFAZOLIN SODIUM-DEXTROSE 2-3 GM-% IV SOLR
INTRAVENOUS | Status: DC | PRN
  Administered 2016-11-06: 2 g via INTRAVENOUS

## 2016-11-06 MED ORDER — ROCURONIUM BROMIDE 10 MG/ML (PF) SYRINGE
PREFILLED_SYRINGE | INTRAVENOUS | Status: AC
  Filled 2016-11-06: qty 5

## 2016-11-06 MED ORDER — SUCCINYLCHOLINE CHLORIDE 200 MG/10ML IV SOSY
PREFILLED_SYRINGE | INTRAVENOUS | Status: AC
  Filled 2016-11-06: qty 10

## 2016-11-06 MED ORDER — FENTANYL CITRATE (PF) 250 MCG/5ML IJ SOLN
INTRAMUSCULAR | Status: AC
  Filled 2016-11-06: qty 5

## 2016-11-06 MED ORDER — BACIT-POLY-NEO HC 1 % EX OINT
TOPICAL_OINTMENT | CUTANEOUS | Status: AC
  Filled 2016-11-06: qty 15

## 2016-11-06 MED ORDER — CHLORHEXIDINE GLUCONATE 0.12 % MT SOLN
OROMUCOSAL | Status: AC
  Filled 2016-11-06: qty 15

## 2016-11-06 MED ORDER — ONDANSETRON HCL 4 MG/2ML IJ SOLN
INTRAMUSCULAR | Status: DC | PRN
  Administered 2016-11-06: 4 mg via INTRAVENOUS

## 2016-11-06 MED ORDER — PROPOFOL 500 MG/50ML IV EMUL
INTRAVENOUS | Status: DC | PRN
  Administered 2016-11-06: 50 ug/kg/min via INTRAVENOUS

## 2016-11-06 MED ORDER — ONDANSETRON HCL 4 MG/2ML IJ SOLN
INTRAMUSCULAR | Status: AC
  Filled 2016-11-06: qty 2

## 2016-11-06 MED ORDER — DEXAMETHASONE SODIUM PHOSPHATE 10 MG/ML IJ SOLN
INTRAMUSCULAR | Status: DC | PRN
  Administered 2016-11-06: 8 mg via INTRAVENOUS

## 2016-11-06 MED ORDER — LIDOCAINE 2% (20 MG/ML) 5 ML SYRINGE
INTRAMUSCULAR | Status: AC
  Filled 2016-11-06: qty 5

## 2016-11-06 MED ORDER — FENTANYL CITRATE (PF) 100 MCG/2ML IJ SOLN
INTRAMUSCULAR | Status: DC | PRN
Start: 2016-11-06 — End: 2016-11-06
  Administered 2016-11-06: 100 ug via INTRAVENOUS
  Administered 2016-11-06: 50 ug via INTRAVENOUS
  Administered 2016-11-06 (×2): 100 ug via INTRAVENOUS

## 2016-11-06 MED ORDER — LIDOCAINE-EPINEPHRINE 1 %-1:100000 IJ SOLN
INTRAMUSCULAR | Status: DC | PRN
  Administered 2016-11-06: 20 mL

## 2016-11-06 MED ORDER — MIDAZOLAM HCL 2 MG/2ML IJ SOLN
INTRAMUSCULAR | Status: AC
  Filled 2016-11-06: qty 2

## 2016-11-06 MED ORDER — SODIUM CHLORIDE 0.9 % IV SOLN
INTRAVENOUS | Status: DC | PRN
  Administered 2016-11-06: 50 ug/h via INTRAVENOUS

## 2016-11-06 MED ORDER — ROCURONIUM BROMIDE 100 MG/10ML IV SOLN
INTRAVENOUS | Status: DC | PRN
  Administered 2016-11-06: 50 mg via INTRAVENOUS
  Administered 2016-11-06: 30 mg via INTRAVENOUS
  Administered 2016-11-06: 40 mg via INTRAVENOUS
  Administered 2016-11-06: 50 mg via INTRAVENOUS
  Administered 2016-11-06: 60 mg via INTRAVENOUS

## 2016-11-06 MED ORDER — DEXAMETHASONE SODIUM PHOSPHATE 10 MG/ML IJ SOLN
INTRAMUSCULAR | Status: AC
  Filled 2016-11-06: qty 1

## 2016-11-06 MED ORDER — POTASSIUM CHLORIDE IN NACL 20-0.9 MEQ/L-% IV SOLN
INTRAVENOUS | Status: DC | PRN
  Administered 2016-11-06: 10:00:00 via INTRAVENOUS

## 2016-11-06 MED ORDER — EPHEDRINE 5 MG/ML INJ
INTRAVENOUS | Status: AC
  Filled 2016-11-06: qty 10

## 2016-11-06 MED ORDER — POTASSIUM CHLORIDE IN NACL 20-0.9 MEQ/L-% IV SOLN: INTRAVENOUS | Status: DC | PRN

## 2016-11-06 MED ORDER — PROPOFOL 10 MG/ML IV BOLUS
INTRAVENOUS | Status: AC
  Filled 2016-11-06: qty 20

## 2016-11-06 MED ORDER — BACIT-POLY-NEO HC 1 % EX OINT
TOPICAL_OINTMENT | CUTANEOUS | Status: DC | PRN
  Administered 2016-11-06: 1

## 2016-11-06 MED ORDER — ROCURONIUM BROMIDE 10 MG/ML (PF) SYRINGE
PREFILLED_SYRINGE | INTRAVENOUS | Status: AC
  Filled 2016-11-06: qty 10

## 2016-11-06 MED ORDER — LIDOCAINE-EPINEPHRINE 1 %-1:100000 IJ SOLN
INTRAMUSCULAR | Status: AC
  Filled 2016-11-06: qty 1

## 2016-11-06 MED ORDER — PROPOFOL 10 MG/ML IV BOLUS
INTRAVENOUS | Status: DC | PRN
  Administered 2016-11-06: 50 mg via INTRAVENOUS

## 2016-11-06 MED ORDER — LACTATED RINGERS IV SOLN
INTRAVENOUS | Status: DC | PRN
  Administered 2016-11-06: 10:00:00 via INTRAVENOUS

## 2016-11-06 MED ORDER — 0.9 % SODIUM CHLORIDE (POUR BTL) OPTIME
TOPICAL | Status: DC | PRN
  Administered 2016-11-06 (×2): 1000 mL

## 2016-11-06 MED ORDER — ARTIFICIAL TEARS OPHTHALMIC OINT
TOPICAL_OINTMENT | OPHTHALMIC | Status: AC
  Filled 2016-11-06: qty 3.5

## 2016-11-06 MED ORDER — OXYMETAZOLINE HCL 0.05 % NA SOLN
NASAL | Status: AC
  Filled 2016-11-06: qty 15

## 2016-11-06 MED ORDER — ARTIFICIAL TEARS OPHTHALMIC OINT
TOPICAL_OINTMENT | OPHTHALMIC | Status: DC | PRN
  Administered 2016-11-06: 1 via OPHTHALMIC

## 2016-11-06 SURGICAL SUPPLY — 90 items
BENZOIN TINCTURE PRP APPL 2/3 (GAUZE/BANDAGES/DRESSINGS) ×3 IMPLANT
BIT DRILL TWIST 1.3X5 (BIT) ×1
BIT DRILL TWIST 1.3X5MM (BIT) ×1 IMPLANT
BIT DRILL TWIST 1.6X58MM (BIT) ×1 IMPLANT
BIT DRILL TWIST 58MM 26MM WL (DRILL) ×1 IMPLANT
BLADE CLIPPER SURG (BLADE) IMPLANT
BLADE SURG 10 STRL SS (BLADE) ×3 IMPLANT
BLADE SURG 15 STRL LF DISP TIS (BLADE) IMPLANT
BLADE SURG 15 STRL SS (BLADE)
CANISTER SUCT 3000ML PPV (MISCELLANEOUS) ×3 IMPLANT
CLEANER TIP ELECTROSURG 2X2 (MISCELLANEOUS) ×3 IMPLANT
CLOSURE WOUND 1/2 X4 (GAUZE/BANDAGES/DRESSINGS) ×1
CONFORMERS SILICONE 5649 (OPHTHALMIC RELATED) IMPLANT
COVER MAYO STAND STRL (DRAPES) ×3 IMPLANT
COVER SURGICAL LIGHT HANDLE (MISCELLANEOUS) ×3 IMPLANT
CRADLE DONUT ADULT HEAD (MISCELLANEOUS) ×3 IMPLANT
DECANTER SPIKE VIAL GLASS SM (MISCELLANEOUS) IMPLANT
DERMABOND ADVANCED (GAUZE/BANDAGES/DRESSINGS) ×2
DERMABOND ADVANCED .7 DNX12 (GAUZE/BANDAGES/DRESSINGS) ×1 IMPLANT
DRAPE HALF SHEET 40X57 (DRAPES) ×3 IMPLANT
DRILL BIT TWIST 1.3X5MM (BIT) ×2
DRILL TWIST 1.6X58MM (BIT) ×3
DRILL TWIST 58MM 26MM WL (DRILL) ×3
DRSG TELFA 3X8 NADH (GAUZE/BANDAGES/DRESSINGS) ×3 IMPLANT
ELECT COATED BLADE 2.86 ST (ELECTRODE) ×3 IMPLANT
ELECT NEEDLE BLADE 2-5/6 (NEEDLE) IMPLANT
ELECT REM PT RETURN 9FT ADLT (ELECTROSURGICAL) ×3
ELECTRODE REM PT RTRN 9FT ADLT (ELECTROSURGICAL) ×1 IMPLANT
GAUZE SPONGE 4X4 16PLY XRAY LF (GAUZE/BANDAGES/DRESSINGS) ×3 IMPLANT
GLOVE BIO SURGEON STRL SZ7 (GLOVE) ×3 IMPLANT
GLOVE BIOGEL PI IND STRL 6.5 (GLOVE) ×2 IMPLANT
GLOVE BIOGEL PI IND STRL 7.0 (GLOVE) ×1 IMPLANT
GLOVE BIOGEL PI IND STRL 8 (GLOVE) ×1 IMPLANT
GLOVE BIOGEL PI INDICATOR 6.5 (GLOVE) ×4
GLOVE BIOGEL PI INDICATOR 7.0 (GLOVE) ×2
GLOVE BIOGEL PI INDICATOR 8 (GLOVE) ×2
GLOVE ECLIPSE 6.5 STRL STRAW (GLOVE) ×3 IMPLANT
GLOVE ECLIPSE 7.5 STRL STRAW (GLOVE) ×9 IMPLANT
GLOVE SURG SS PI 6.5 STRL IVOR (GLOVE) ×6 IMPLANT
GLOVE SURG SS PI 7.0 STRL IVOR (GLOVE) ×3 IMPLANT
GOWN STRL REUS W/ TWL LRG LVL3 (GOWN DISPOSABLE) ×4 IMPLANT
GOWN STRL REUS W/TWL LRG LVL3 (GOWN DISPOSABLE) ×8
HOLDER TRACH TUBE VELCRO 19.5 (MISCELLANEOUS) ×3 IMPLANT
KIT BASIN OR (CUSTOM PROCEDURE TRAY) ×3 IMPLANT
KIT ROOM TURNOVER OR (KITS) ×3 IMPLANT
NEEDLE HYPO 25GX1X1/2 BEV (NEEDLE) ×3 IMPLANT
NS IRRIG 1000ML POUR BTL (IV SOLUTION) ×3 IMPLANT
PAD ARMBOARD 7.5X6 YLW CONV (MISCELLANEOUS) ×3 IMPLANT
PATTIES SURGICAL .5 X3 (DISPOSABLE) IMPLANT
PATTIES SURGICAL .5X1.5 (GAUZE/BANDAGES/DRESSINGS) ×3 IMPLANT
PENCIL FOOT CONTROL (ELECTRODE) ×3 IMPLANT
PLATE BONE 8MMX6 0.6MM RIGHT (Plate) ×3 IMPLANT
PLATE HYBRID MMF (Plate) ×6 IMPLANT
PLATE MID FACE 6H 12M 100D RT (Plate) ×3 IMPLANT
PLATE MNDBLE FRACTURE 4H (Plate) ×3 IMPLANT
PLATE MNDBLE MINI 4H (Plate) ×3 IMPLANT
SCISSORS WIRE DISP (INSTRUMENTS) ×3 IMPLANT
SCREW BONE 2.7X12MM EMERG (Screw) ×3 IMPLANT
SCREW BONE 2.7X8MM EMERG (Screw) ×3 IMPLANT
SCREW MID FACE 1.7X5MM SLF TAP (Screw) ×33 IMPLANT
SCREW MNDBLE 2.0X8 BONE (Screw) ×12 IMPLANT
SCREW MNDBLE 2.3X10MM BONE (Screw) ×6 IMPLANT
SCREW MNDBLE 2.3X12MM BONE (Screw) ×9 IMPLANT
SCREW UPPER FACE 2.0X8MM (Screw) ×33 IMPLANT
SPONGE DRAIN TRACH 4X4 STRL 2S (GAUZE/BANDAGES/DRESSINGS) IMPLANT
STRIP CLOSURE SKIN 1/2X4 (GAUZE/BANDAGES/DRESSINGS) ×2 IMPLANT
SURGILUBE 2OZ TUBE FLIPTOP (MISCELLANEOUS) IMPLANT
SUT CHROMIC 3 0 PS 2 (SUTURE) ×6 IMPLANT
SUT CHROMIC 4 0 PS 2 18 (SUTURE) ×3 IMPLANT
SUT CHROMIC GUT 2 0 PS 2 27 (SUTURE) ×3 IMPLANT
SUT ETHILON 3 0 PS 1 (SUTURE) IMPLANT
SUT ETHILON 5 0 P 3 18 (SUTURE)
SUT NYLON ETHILON 5-0 P-3 1X18 (SUTURE) IMPLANT
SUT SILK 2 0 FS (SUTURE) IMPLANT
SUT SILK 3 0 SH 30 (SUTURE) ×3 IMPLANT
SUT SILK 3 0 TIES 17X18 (SUTURE) ×2
SUT SILK 3-0 18XBRD TIE BLK (SUTURE) ×1 IMPLANT
SUT STEEL 0 (SUTURE)
SUT STEEL 0 18XMFL TIE 17 (SUTURE) IMPLANT
SUT STEEL 2 (SUTURE) ×3 IMPLANT
SUT STEEL 4 (SUTURE) ×3 IMPLANT
SYR 10ML LL (SYRINGE) ×3 IMPLANT
SYR BULB IRRIGATION 50ML (SYRINGE) ×3 IMPLANT
SYR CONTROL 10ML LL (SYRINGE) IMPLANT
TOWEL OR 17X24 6PK STRL BLUE (TOWEL DISPOSABLE) ×3 IMPLANT
TRAY ENT MC OR (CUSTOM PROCEDURE TRAY) ×3 IMPLANT
TUBE CONNECTING 12'X1/4 (SUCTIONS)
TUBE CONNECTING 12X1/4 (SUCTIONS) IMPLANT
TUBE TRACH SHILEY  6 DIST  CUF (TUBING) ×3 IMPLANT
WATER STERILE IRR 1000ML POUR (IV SOLUTION) IMPLANT

## 2016-11-06 NOTE — Progress Notes (Signed)
Pt. Transported uneventfully to/from CT.
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
## 2016-11-06 NOTE — Transfer of Care (Signed)
Immediate Anesthesia Transfer of Care Note  Patient: Tashan O Kleven  Procedure(s) Performed: Procedure(s) with comments: OPEN REDUCTION INTERNAL FIXATION (ORIF) RIGHT TRIPOD AND MANDIBULAR FRACTURE; CLOSED REDUCTION NASAL FRACTURE (N/A) - ORIF right orbital fracture, Bilateral maxillary fracture, mandible fracture, Mandibulo-maxillary fixation, tracheostomy TRACHEOSTOMY (N/A)  Patient Location: NICU  Anesthesia Type:General  Level of Consciousness: unresponsive and Patient remains intubated per anesthesia plan  Airway & Oxygen Therapy: Patient remains intubated per anesthesia plan and Patient placed on Ventilator (see vital sign flow sheet for setting)  Post-op Assessment: Report given to RN and Post -op Vital signs reviewed and stable  Post vital signs: Reviewed and stable  Last Vitals:  Vitals:   11/06/16 0900 11/06/16 1000  BP: 125/65 127/74  Pulse: 69 68  Resp: 18 16  Temp: 37.8 C 37.5 C  SpO2: 98% 100%    Last Pain:  Vitals:   11/06/16 0400  TempSrc: Core (Comment)  PainSc:          Complications: No apparent anesthesia complications

## 2016-11-06 NOTE — Anesthesia Preprocedure Evaluation (Addendum)
Anesthesia Evaluation  Patient identified by MRN, date of birth, ID band Patient unresponsive    Reviewed: Allergy & Precautions, Patient's Chart, lab work & pertinent test results, Unable to perform ROS - Chart review only  Airway Mallampati: Intubated       Dental   Pulmonary neg pulmonary ROS, Current Smoker,     + decreased breath sounds      Cardiovascular negative cardio ROS   Rhythm:Regular Rate:Normal     Neuro/Psych negative neurological ROS     GI/Hepatic negative GI ROS, Neg liver ROS,   Endo/Other  negative endocrine ROS  Renal/GU negative Renal ROS     Musculoskeletal negative musculoskeletal ROS (+)   Abdominal   Peds  Hematology negative hematology ROS (+)   Anesthesia Other Findings Day of surgery medications reviewed with the patient.  Reproductive/Obstetrics                            Anesthesia Physical Anesthesia Plan  ASA: IV  Anesthesia Plan: General   Post-op Pain Management:    Induction: Inhalational  PONV Risk Score and Plan: 1 and Ondansetron and Dexamethasone  Airway Management Planned: Oral ETT  Additional Equipment:   Intra-op Plan:   Post-operative Plan: Post-operative intubation/ventilation  Informed Consent: I have reviewed the patients History and Physical, chart, labs and discussed the procedure including the risks, benefits and alternatives for the proposed anesthesia with the patient or authorized representative who has indicated his/her understanding and acceptance.   Dental advisory given  Plan Discussed with: CRNA  Anesthesia Plan Comments:         Anesthesia Quick Evaluation

## 2016-11-06 NOTE — OR Nursing (Signed)
Obturator and Wire scissors taped to LEFT upper bed rail upon leaving OR.
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
## 2016-11-06 NOTE — Anesthesia Postprocedure Evaluation (Signed)
Anesthesia Post Note  Patient: Archibald O Zweig  Procedure(s) Performed: Procedure(s) (LRB): OPEN REDUCTION INTERNAL FIXATION (ORIF) RIGHT TRIPOD AND MANDIBULAR FRACTURE; CLOSED REDUCTION NASAL FRACTURE (N/A) TRACHEOSTOMY (N/A)     Patient location during evaluation: PACU Anesthesia Type: General Level of consciousness: awake and alert Pain management: pain level controlled Vital Signs Assessment: post-procedure vital signs reviewed and stable Respiratory status: spontaneous breathing, nonlabored ventilation, respiratory function stable and patient connected to nasal cannula oxygen Cardiovascular status: blood pressure returned to baseline and stable Postop Assessment: no signs of nausea or vomiting Anesthetic complications: no    Last Vitals:  Vitals:   11/06/16 1000 11/06/16 1330  BP: 127/74 121/70  Pulse: 68 93  Resp: 16 15  Temp: 37.5 C 36.5 C  SpO2: 100% 100%    Last Pain:  Vitals:   11/06/16 0400  TempSrc: Core (Comment)  PainSc:                  Shelton Silvas

## 2016-11-06 NOTE — Progress Notes (Signed)
NEUROSURGERY PROGRESS NOTE  FC appropriately. Still sedated on ventilator. LUE paralysis. CT of head this morning stable.   Temp:  [99 F (37.2 C)-101.1 F (38.4 C)] 99.9 F (37.7 C) (08/24 0600) Pulse Rate:  [69-108] 72 (08/24 0600) Resp:  [0-27] 18 (08/24 0600) BP: (83-141)/(53-98) 140/75 (08/24 0600) SpO2:  [88 %-100 %] 97 % (08/24 0600) Arterial Line BP: (70-139)/(43-89) 132/63 (08/24 0600) FiO2 (%):  [40 %-50 %] 40 % (08/24 0600)  Plan:  Continue ventilator support per CCM. No new recommendations from NS standpoint.  Sherryl Manges, NP 11/06/2016 6:56 AM
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
## 2016-11-07 ENCOUNTER — Inpatient Hospital Stay (HOSPITAL_COMMUNITY): Payer: Self-pay

## 2016-11-07 LAB — CBC WITH DIFFERENTIAL/PLATELET
Basophils Absolute: 0 10*3/uL (ref 0.0–0.1)
Basophils Relative: 0 %
EOS ABS: 0 10*3/uL (ref 0.0–0.7)
Eosinophils Relative: 0 %
HEMATOCRIT: 26.1 % — AB (ref 39.0–52.0)
HEMOGLOBIN: 8.8 g/dL — AB (ref 13.0–17.0)
LYMPHS ABS: 1 10*3/uL (ref 0.7–4.0)
Lymphocytes Relative: 13 %
MCH: 31.7 pg (ref 26.0–34.0)
MCHC: 33.7 g/dL (ref 30.0–36.0)
MCV: 93.9 fL (ref 78.0–100.0)
Monocytes Absolute: 0.7 10*3/uL (ref 0.1–1.0)
Monocytes Relative: 9 %
Neutro Abs: 6.1 10*3/uL (ref 1.7–7.7)
Neutrophils Relative %: 78 %
Platelets: 132 10*3/uL — ABNORMAL LOW (ref 150–400)
RBC: 2.78 MIL/uL — ABNORMAL LOW (ref 4.22–5.81)
RDW: 14 % (ref 11.5–15.5)
WBC: 7.8 10*3/uL (ref 4.0–10.5)

## 2016-11-07 LAB — PHOSPHORUS: PHOSPHORUS: 2.7 mg/dL (ref 2.5–4.6)

## 2016-11-07 LAB — BASIC METABOLIC PANEL
Anion gap: 8 (ref 5–15)
BUN: 9 mg/dL (ref 6–20)
CHLORIDE: 108 mmol/L (ref 101–111)
CO2: 23 mmol/L (ref 22–32)
CREATININE: 0.73 mg/dL (ref 0.61–1.24)
Calcium: 8.1 mg/dL — ABNORMAL LOW (ref 8.9–10.3)
GFR calc Af Amer: >60 mL/min (ref >60)
GFR calc non Af Amer: >60 mL/min (ref >60)
GLUCOSE: 110 mg/dL — AB (ref 65–99)
Potassium: 4 mmol/L (ref 3.5–5.1)
SODIUM: 139 mmol/L (ref 135–145)

## 2016-11-07 LAB — GLUCOSE, CAPILLARY
GLUCOSE-CAPILLARY: 115 mg/dL — AB (ref 65–99)
GLUCOSE-CAPILLARY: 110 mg/dL — AB (ref 65–99)

## 2016-11-07 LAB — MAGNESIUM: MAGNESIUM: 1.6 mg/dL — AB (ref 1.7–2.4)

## 2016-11-07 MED ORDER — PIVOT 1.5 CAL PO LIQD
1000.0000 mL | ORAL | Status: DC
  Administered 2016-11-07 – 2016-11-11 (×5): 1000 mL
  Filled 2016-11-07 (×8): qty 1000

## 2016-11-07 MED ORDER — PRO-STAT SUGAR FREE PO LIQD
60.0000 mL | Freq: Four times a day (QID) | ORAL | Status: DC
  Administered 2016-11-07 – 2016-11-12 (×19): 60 mL
  Filled 2016-11-07 (×18): qty 60

## 2016-11-07 MED ORDER — MAGNESIUM SULFATE 2 GM/50ML IV SOLN
2.0000 g | Freq: Once | INTRAVENOUS | Status: AC
  Administered 2016-11-07: 2 g via INTRAVENOUS
  Filled 2016-11-07: qty 50

## 2016-11-07 MED ORDER — PANTOPRAZOLE SODIUM 40 MG PO PACK
40.0000 mg | PACK | ORAL | Status: DC
  Administered 2016-11-08 – 2016-11-15 (×8): 40 mg
  Filled 2016-11-07 (×8): qty 20

## 2016-11-07 NOTE — Progress Notes (Signed)
Cortrak Tube Team Note:  Consult received to place a Cortrak feeding tube.   A 10 F Cortrak tube was placed in the L nare and secured with orange tape at 77 cm. Tube placed by Gaynelle Adu, MD. Per the Cortrak monitor reading the tube tip is at pylorus.   No x-ray is required. RN may begin using tube.   Also given verbal order to begin TF. Propofol rate was decreased to 19 ml/hr since yesterday. Provides 502 kcals/day  Using needs calculated yesterday, new regimen is as follows.   TF via NGT with PIVOT 1.5 at goal rate of 30 ml/h (720 ml per day) and Prostat 60 ml QID to provide 1880 kcals (+502 from Diprivan), 188 gm protein, 546 ml free water daily.  If the tube becomes dislodged please keep the tube and contact the Cortrak team at [email or web address] (password [email or web address]) for replacement.   If after hours and replacement cannot be delayed, place a NG tube and confirm placement with an abdominal x-ray.   Christophe Louis RD, LDN, CNSC Clinical Nutrition Pager: [phone] 11/07/2016 11:53 AM
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
## 2016-11-07 NOTE — Progress Notes (Signed)
Patient ID: Chase Ayala, male   DOB: 1990/09/13, 26 y.o.   MRN: [id number] Awake assist him intermittently follows commands but tracks moves right upper and both lower extremities well remains with left monoparesis  We'll obtain cervical spine and brachial plexus MRIs when patient stable for transport

## 2016-11-07 NOTE — Progress Notes (Signed)
Patient ID: Chase Ayala, male   DOB: 1990-06-16, 26 y.o.   MRN: [id number]   Post op day 1  Asleep, on ventilator.  Tracheostomy in good position. No bleeding.  MMF is stable. Incisions all intact.  Stable post op. Continue ICU care.

## 2016-11-08 ENCOUNTER — Encounter (HOSPITAL_COMMUNITY): Payer: Self-pay | Admitting: Otolaryngology

## 2016-11-08 ENCOUNTER — Inpatient Hospital Stay (HOSPITAL_COMMUNITY): Payer: Self-pay

## 2016-11-08 LAB — BASIC METABOLIC PANEL
Anion gap: 6 (ref 5–15)
BUN: 13 mg/dL (ref 6–20)
CALCIUM: 7.6 mg/dL — AB (ref 8.9–10.3)
CHLORIDE: 109 mmol/L (ref 101–111)
CO2: 23 mmol/L (ref 22–32)
CREATININE: 0.77 mg/dL (ref 0.61–1.24)
GFR calc Af Amer: >60 mL/min (ref >60)
GFR calc non Af Amer: >60 mL/min (ref >60)
GLUCOSE: 93 mg/dL (ref 65–99)
Potassium: 3.7 mmol/L (ref 3.5–5.1)
Sodium: 138 mmol/L (ref 135–145)

## 2016-11-08 LAB — CBC
HEMATOCRIT: 26.6 % — AB (ref 39.0–52.0)
Hemoglobin: 8.8 g/dL — ABNORMAL LOW (ref 13.0–17.0)
MCH: 31.4 pg (ref 26.0–34.0)
MCHC: 33.1 g/dL (ref 30.0–36.0)
MCV: 95 fL (ref 78.0–100.0)
Platelets: 141 10*3/uL — ABNORMAL LOW (ref 150–400)
RBC: 2.8 MIL/uL — ABNORMAL LOW (ref 4.22–5.81)
RDW: 14 % (ref 11.5–15.5)
WBC: 6.3 10*3/uL (ref 4.0–10.5)

## 2016-11-08 LAB — GLUCOSE, CAPILLARY
GLUCOSE-CAPILLARY: 90 mg/dL (ref 65–99)
GLUCOSE-CAPILLARY: 97 mg/dL (ref 65–99)
GLUCOSE-CAPILLARY: 109 mg/dL — AB (ref 65–99)
Glucose-Capillary: 104 mg/dL — ABNORMAL HIGH (ref 65–99)
Glucose-Capillary: 96 mg/dL (ref 65–99)
Glucose-Capillary: 98 mg/dL (ref 65–99)

## 2016-11-08 LAB — TRIGLYCERIDES: Triglycerides: 247 mg/dL — ABNORMAL HIGH (ref <150)

## 2016-11-08 LAB — MAGNESIUM: Magnesium: 1.6 mg/dL — ABNORMAL LOW (ref 1.7–2.4)

## 2016-11-08 NOTE — Progress Notes (Signed)
No acute events Sedated Arousable Moves RUE and BLE but LUE is flaccid MRIs not yet performed Continue supportive care

## 2016-11-08 NOTE — Progress Notes (Signed)
Patient repeatedly trying to scratch nose, removing steri strips and the tape holding the coretrack in place. Coretrack no longer at marked position. Tube feeding turned off and coretrack secured.

## 2016-11-08 NOTE — Progress Notes (Signed)
Was told by Burnell Blanks, RN that patient had disconnected from the ventilator and his inner cannula had been removed. Patient desat to 51's briefly. Anette Riedel reported that patient was resting comfortably again. Will watch closely and reassess if restraints would be needed.
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
## 2016-11-08 NOTE — Progress Notes (Signed)
Follow up - Trauma and Critical Care  Patient Details:    Chase Ayala is an 26 y.o. male.  Lines/tubes : Arterial Line 11/05/16 Left Radial (Active)  Site Assessment Clean;Dry;Intact 11/07/2016  8:00 PM  Line Status Pulsatile blood flow 11/07/2016  8:00 PM  Art Line Waveform Appropriate 11/07/2016  8:00 PM  Art Line Interventions Zeroed and calibrated;Leveled;Flushed per protocol;Connections checked and tightened 11/07/2016  8:00 PM  Color/Movement/Sensation Capillary refill less than 3 sec 11/07/2016  8:00 PM  Dressing Type Transparent;Occlusive 11/07/2016  8:00 PM  Dressing Status Clean;Dry;Intact;Antimicrobial disc in place 11/07/2016  8:00 PM  Dressing Change Due 11/12/16 11/07/2016  8:00 PM     Chest Tube 1 Right Pleural 14 Fr. (Active)  Suction -30 cm H2O 11/07/2016  8:00 PM  Chest Tube Air Leak Minimal 11/07/2016  8:00 PM  Patency Intervention Tip/tilt 11/07/2016  8:00 PM  Drainage Description Serosanguineous 11/07/2016  8:00 PM  Dressing Status Clean;Dry;Intact 11/07/2016  8:00 PM  Dressing Intervention Dressing changed 11/07/2016  8:00 PM  Site Assessment Clean;Dry;Intact 11/07/2016  8:00 PM  Surrounding Skin Dry;Intact 11/07/2016  8:00 PM  Output (mL) 135 mL 11/08/2016  6:00 AM     Urethral Catheter yasemia Straight-tip;Temperature probe 16 Fr. (Active)  Indication for Insertion or Continuance of Catheter Peri-operative use for selective surgical procedure 11/08/2016  8:00 AM  Site Assessment Clean;Intact 11/07/2016  8:00 PM  Catheter Maintenance Bag below level of bladder;Catheter secured;Drainage bag/tubing not touching floor;Insertion date on drainage bag;No dependent loops;Seal intact 11/08/2016  8:00 AM  Collection Container Standard drainage bag 11/07/2016  8:00 PM  Securement Method Leg strap 11/07/2016  8:00 PM  Urinary Catheter Interventions Unclamped 11/07/2016  8:00 PM  Output (mL) 80 mL 11/08/2016  6:00 AM    Microbiology/Sepsis markers: No results found for this or any  previous visit.  Anti-infectives:  Anti-infectives    Start     Dose/Rate Route Frequency Ordered Stop   11/05/16 0600  ceFAZolin (ANCEF) IVPB 2g/100 mL premix     2 g 200 mL/hr over 30 Minutes Intravenous  Once 11/05/16 0559 11/05/16 0729      Best Practice/Protocols:  VTE Prophylaxis: Mechanical Continous Sedation  Consults: Treatment Team:  Donalee Citrin, MD Myrene Galas, MD Serena Colonel, MD    Events:  Subjective:    Overnight Issues: No significant change   Objective:  Vital signs for last 24 hours: Temp:  [98.2 F (36.8 C)-100.4 F (38 C)] 100.4 F (38 C) (08/26 0700) Pulse Rate:  [47-84] 59 (08/26 0700) Resp:  [14-22] 16 (08/26 0700) BP: (107-148)/(44-82) 120/44 (08/26 0737) SpO2:  [69 %-100 %] 100 % (08/26 0737) Arterial Line BP: (100-161)/(42-74) 126/46 (08/26 0700) FiO2 (%):  [40 %] 40 % (08/26 0737) Weight:  [92.7 kg (204 lb 5.9 oz)-93.8 kg (206 lb 12.7 oz)] 93.8 kg (206 lb 12.7 oz) (08/26 0600)  Hemodynamic parameters for last 24 hours:    Intake/Output from previous day: 08/25 0701 - 08/26 0700 In: 3702.4 [I.V.:3433.4; NG/GT:219; IV Piggyback:50] Out: 1000 [Urine:815; Chest Tube:185]  Intake/Output this shift: No intake/output data recorded.  Vent settings for last 24 hours: Vent Mode: PRVC FiO2 (%):  [40 %] 40 % Set Rate:  [16 bmp-18 bmp] 16 bmp Vt Set:  [560 mL] 560 mL PEEP:  [5 cmH20-10 cmH20] 5 cmH20 Plateau Pressure:  [20 cmH20-25 cmH20] 20 cmH20  Physical Exam:  General: on vent sedated  wakes and tracks  Neuro: sedated not moving LUE  Resp: rhonchi bilaterally GI:  soft, nontender, BS WNL, no r/g  Results for orders placed or performed during the hospital encounter of 11/05/16 (from the past 24 hour(s))  Glucose, capillary     Status: Abnormal   Collection Time: 11/07/16  3:54 PM  Result Value Ref Range   Glucose-Capillary 115 (H) 65 - 99 mg/dL  Glucose, capillary     Status: Abnormal   Collection Time: 11/07/16  8:33 PM   Result Value Ref Range   Glucose-Capillary 110 (H) 65 - 99 mg/dL  Glucose, capillary     Status: None   Collection Time: 11/07/16 11:50 PM  Result Value Ref Range   Glucose-Capillary 98 65 - 99 mg/dL  Glucose, capillary     Status: None   Collection Time: 11/08/16  4:26 AM  Result Value Ref Range   Glucose-Capillary 96 65 - 99 mg/dL  CBC     Status: Abnormal   Collection Time: 11/08/16  6:44 AM  Result Value Ref Range   WBC 6.3 4.0 - 10.5 K/uL   RBC 2.80 (L) 4.22 - 5.81 MIL/uL   Hemoglobin 8.8 (L) 13.0 - 17.0 g/dL   HCT 40.9 (L) 81.1 - 91.4 %   MCV 95.0 78.0 - 100.0 fL   MCH 31.4 26.0 - 34.0 pg   MCHC 33.1 30.0 - 36.0 g/dL   RDW 78.2 95.6 - 21.3 %   Platelets 141 (L) 150 - 400 K/uL  Basic metabolic panel     Status: Abnormal   Collection Time: 11/08/16  6:44 AM  Result Value Ref Range   Sodium 138 135 - 145 mmol/L   Potassium 3.7 3.5 - 5.1 mmol/L   Chloride 109 101 - 111 mmol/L   CO2 23 22 - 32 mmol/L   Glucose, Bld 93 65 - 99 mg/dL   BUN 13 6 - 20 mg/dL   Creatinine, Ser 0.86 0.61 - 1.24 mg/dL   Calcium 7.6 (L) 8.9 - 10.3 mg/dL   GFR calc non Af Amer >60 >60 mL/min   GFR calc Af Amer >60 >60 mL/min   Anion gap 6 5 - 15  Magnesium     Status: Abnormal   Collection Time: 11/08/16  6:44 AM  Result Value Ref Range   Magnesium 1.6 (L) 1.7 - 2.4 mg/dL  Triglycerides     Status: Abnormal   Collection Time: 11/08/16  6:44 AM  Result Value Ref Range   Triglycerides 247 (H) <150 mg/dL  Glucose, capillary     Status: None   Collection Time: 11/08/16  8:00 AM  Result Value Ref Range   Glucose-Capillary 90 65 - 99 mg/dL     Assessment/Plan:    Additional comments:I reviewed the patient's new clinical lab test results. [name] and I reviewed the patients new imaging test results. .  Critical Care Total Time*: 30 Minutes  Obi Scrima A. 11/08/2016  *Care during the described time interval was provided by me and/or other providers on the critical care team.  I have  reviewed this patient's available data, including medical history, events of note, physical examination and test results as part of my evaluation.

## 2016-11-09 ENCOUNTER — Encounter (HOSPITAL_COMMUNITY): Payer: Self-pay | Admitting: Orthopedic Surgery

## 2016-11-09 ENCOUNTER — Inpatient Hospital Stay (HOSPITAL_COMMUNITY): Payer: Self-pay

## 2016-11-09 DIAGNOSIS — S143XXA Injury of brachial plexus, initial encounter: Secondary | ICD-10-CM

## 2016-11-09 DIAGNOSIS — S42101A Fracture of unspecified part of scapula, right shoulder, initial encounter for closed fracture: Secondary | ICD-10-CM

## 2016-11-09 HISTORY — DX: Fracture of unspecified part of scapula, right shoulder, initial encounter for closed fracture: S42.101A

## 2016-11-09 HISTORY — DX: Injury of brachial plexus, initial encounter: S14.3XXA

## 2016-11-09 LAB — GLUCOSE, CAPILLARY
GLUCOSE-CAPILLARY: 101 mg/dL — AB (ref 65–99)
GLUCOSE-CAPILLARY: 101 mg/dL — AB (ref 65–99)
GLUCOSE-CAPILLARY: 102 mg/dL — AB (ref 65–99)
GLUCOSE-CAPILLARY: 109 mg/dL — AB (ref 65–99)
Glucose-Capillary: 117 mg/dL — ABNORMAL HIGH (ref 65–99)
Glucose-Capillary: 99 mg/dL (ref 65–99)
Glucose-Capillary: 131 mg/dL — ABNORMAL HIGH (ref 65–99)

## 2016-11-09 MED ORDER — QUETIAPINE FUMARATE 25 MG PO TABS
50.0000 mg | ORAL_TABLET | Freq: Every day | ORAL | Status: DC
  Administered 2016-11-09: 50 mg
  Filled 2016-11-09: qty 2

## 2016-11-09 MED ORDER — GADOBENATE DIMEGLUMINE 529 MG/ML IV SOLN
20.0000 mL | Freq: Once | INTRAVENOUS | Status: AC
  Administered 2016-11-09: 20 mL via INTRAVENOUS

## 2016-11-09 MED ORDER — CLONAZEPAM 0.5 MG PO TABS
0.5000 mg | ORAL_TABLET | Freq: Two times a day (BID) | ORAL | Status: DC
  Administered 2016-11-09 (×2): 0.5 mg
  Filled 2016-11-09 (×2): qty 1

## 2016-11-09 MED ORDER — ENOXAPARIN SODIUM 40 MG/0.4ML ~~LOC~~ SOLN
40.0000 mg | SUBCUTANEOUS | Status: DC
  Administered 2016-11-09 – 2016-11-12 (×4): 40 mg via SUBCUTANEOUS
  Filled 2016-11-09 (×4): qty 0.4

## 2016-11-09 NOTE — Progress Notes (Signed)
Transported Patient to MRI and back to 4N with no events.
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
## 2016-11-09 NOTE — Plan of Care (Signed)
Problem: Physical Regulation: Goal: Will regain or maintain usual level of consciousness Outcome: Progressing Pt able to follow commands, responds to voice. Goal: Neurologic status will improve Outcome: Progressing Pt able to follow commands, full movement of RUE, RLE, and LLE

## 2016-11-10 ENCOUNTER — Encounter (HOSPITAL_COMMUNITY): Payer: Self-pay

## 2016-11-10 ENCOUNTER — Inpatient Hospital Stay (HOSPITAL_COMMUNITY): Payer: Self-pay

## 2016-11-10 LAB — BASIC METABOLIC PANEL
Anion gap: 7 (ref 5–15)
BUN: 14 mg/dL (ref 6–20)
CALCIUM: 8.1 mg/dL — AB (ref 8.9–10.3)
CO2: 25 mmol/L (ref 22–32)
CREATININE: 0.73 mg/dL (ref 0.61–1.24)
Chloride: 108 mmol/L (ref 101–111)
GFR calc Af Amer: >60 mL/min (ref >60)
GFR calc non Af Amer: >60 mL/min (ref >60)
Glucose, Bld: 126 mg/dL — ABNORMAL HIGH (ref 65–99)
Potassium: 3.9 mmol/L (ref 3.5–5.1)
Sodium: 140 mmol/L (ref 135–145)

## 2016-11-10 LAB — GLUCOSE, CAPILLARY
GLUCOSE-CAPILLARY: 111 mg/dL — AB (ref 65–99)
GLUCOSE-CAPILLARY: 121 mg/dL — AB (ref 65–99)
Glucose-Capillary: 113 mg/dL — ABNORMAL HIGH (ref 65–99)
Glucose-Capillary: 115 mg/dL — ABNORMAL HIGH (ref 65–99)

## 2016-11-10 LAB — CBC
HCT: 28 % — ABNORMAL LOW (ref 39.0–52.0)
Hemoglobin: 9.2 g/dL — ABNORMAL LOW (ref 13.0–17.0)
MCH: 31.3 pg (ref 26.0–34.0)
MCHC: 32.9 g/dL (ref 30.0–36.0)
MCV: 95.2 fL (ref 78.0–100.0)
PLATELETS: 204 10*3/uL (ref 150–400)
RBC: 2.94 MIL/uL — ABNORMAL LOW (ref 4.22–5.81)
RDW: 13.8 % (ref 11.5–15.5)
WBC: 7.9 10*3/uL (ref 4.0–10.5)

## 2016-11-10 MED ORDER — QUETIAPINE FUMARATE 100 MG PO TABS
100.0000 mg | ORAL_TABLET | Freq: Every day | ORAL | Status: DC
  Administered 2016-11-10: 100 mg
  Filled 2016-11-10: qty 1

## 2016-11-10 MED ORDER — MAGNESIUM CITRATE PO SOLN
0.5000 | Freq: Once | ORAL | Status: AC
  Administered 2016-11-10: 0.5
  Filled 2016-11-10: qty 296

## 2016-11-10 MED ORDER — CLONAZEPAM 1 MG PO TABS
1.0000 mg | ORAL_TABLET | Freq: Two times a day (BID) | ORAL | Status: DC
  Administered 2016-11-10 (×2): 1 mg
  Filled 2016-11-10 (×2): qty 1

## 2016-11-10 MED ORDER — POLYETHYLENE GLYCOL 3350 17 G PO PACK
17.0000 g | PACK | Freq: Every day | ORAL | Status: DC
  Administered 2016-11-10 – 2016-11-16 (×7): 17 g
  Filled 2016-11-10 (×7): qty 1
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
## 2016-11-10 NOTE — Progress Notes (Signed)
Patient ID: Chase Ayala, male   DOB: 09-Apr-1990, 26 y.o.   MRN: [id number] Patient remains intubated and sedated  Withdraws right upper extremity and both lower extremities maintains a left upper extremities monoparesis  MRI cervical spine shows extensive soft tissue and ligamentous injury posteriorly which should heal fine in the cervical collar. MRI of left shoulder brachial plexus shows possible evulsion injuries C7 C8 with extensive edema throughout brachial plexus. No intervention needed for this at this time.
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
## 2016-11-10 NOTE — Progress Notes (Signed)
Orthopedic Tech Progress Note Patient Details:  Chase Ayala 1991-01-11 [id number]  Ortho Devices Type of Ortho Device: Arm sling Ortho Device/Splint Location: Called and ordered Resting hand splint via [employer] Left Ortho Device/Splint Interventions: Allyne Gee 11/10/2016, 3:57 PM

## 2016-11-10 NOTE — Plan of Care (Signed)
Problem: Respiratory: Goal: Ability to maintain adequate ventilation will improve Outcome: Progressing Pt weaning on vent w/ trach.  Problem: Skin Integrity: Goal: Risk for impaired skin integrity will decrease Outcome: Progressing Turn q2hr, heels off bed, pillow between knees. Goal: Demonstration of wound healing without infection will improve Outcome: Progressing Wound sites healing as expected, no s/s infection at wound sites.  Problem: Psychosocial: Goal: Ability to participate in self-care as condition permits will improve Outcome: Not Progressing Pt not interactive, does not participate in ADLs.
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
## 2016-11-11 ENCOUNTER — Encounter (HOSPITAL_COMMUNITY): Payer: Self-pay

## 2016-11-11 ENCOUNTER — Inpatient Hospital Stay (HOSPITAL_COMMUNITY): Payer: Self-pay

## 2016-11-11 LAB — GLUCOSE, CAPILLARY
GLUCOSE-CAPILLARY: 125 mg/dL — AB (ref 65–99)
GLUCOSE-CAPILLARY: 126 mg/dL — AB (ref 65–99)
GLUCOSE-CAPILLARY: 120 mg/dL — AB (ref 65–99)
Glucose-Capillary: 110 mg/dL — ABNORMAL HIGH (ref 65–99)
Glucose-Capillary: 114 mg/dL — ABNORMAL HIGH (ref 65–99)
Glucose-Capillary: 115 mg/dL — ABNORMAL HIGH (ref 65–99)
Glucose-Capillary: 116 mg/dL — ABNORMAL HIGH (ref 65–99)
Glucose-Capillary: 116 mg/dL — ABNORMAL HIGH (ref 65–99)

## 2016-11-11 LAB — CBC
HCT: 28.3 % — ABNORMAL LOW (ref 39.0–52.0)
Hemoglobin: 9.1 g/dL — ABNORMAL LOW (ref 13.0–17.0)
MCH: 31.1 pg (ref 26.0–34.0)
MCHC: 32.2 g/dL (ref 30.0–36.0)
MCV: 96.6 fL (ref 78.0–100.0)
PLATELETS: 241 10*3/uL (ref 150–400)
RBC: 2.93 MIL/uL — ABNORMAL LOW (ref 4.22–5.81)
RDW: 13.9 % (ref 11.5–15.5)
WBC: 9.2 10*3/uL (ref 4.0–10.5)

## 2016-11-11 LAB — BASIC METABOLIC PANEL
Anion gap: 4 — ABNORMAL LOW (ref 5–15)
BUN: 14 mg/dL (ref 6–20)
CALCIUM: 7.8 mg/dL — AB (ref 8.9–10.3)
CO2: 27 mmol/L (ref 22–32)
CREATININE: 0.66 mg/dL (ref 0.61–1.24)
Chloride: 109 mmol/L (ref 101–111)
GFR calc Af Amer: >60 mL/min (ref >60)
Glucose, Bld: 119 mg/dL — ABNORMAL HIGH (ref 65–99)
Potassium: 4 mmol/L (ref 3.5–5.1)
SODIUM: 140 mmol/L (ref 135–145)

## 2016-11-11 LAB — TRIGLYCERIDES: TRIGLYCERIDES: 146 mg/dL (ref <150)

## 2016-11-11 MED ORDER — QUETIAPINE FUMARATE 100 MG PO TABS
100.0000 mg | ORAL_TABLET | Freq: Two times a day (BID) | ORAL | Status: DC
  Administered 2016-11-11 – 2016-11-13 (×5): 100 mg
  Filled 2016-11-11 (×5): qty 1

## 2016-11-11 MED ORDER — MAGNESIUM CITRATE PO SOLN
0.5000 | Freq: Once | ORAL | Status: AC
  Administered 2016-11-11: 0.5 via ORAL

## 2016-11-11 MED ORDER — BISACODYL 10 MG RE SUPP
10.0000 mg | Freq: Every day | RECTAL | Status: DC | PRN
  Administered 2016-11-12: 10 mg via RECTAL
  Filled 2016-11-11 (×2): qty 1

## 2016-11-11 MED ORDER — CLONAZEPAM 1 MG PO TABS
1.0000 mg | ORAL_TABLET | Freq: Three times a day (TID) | ORAL | Status: DC
  Administered 2016-11-11 – 2016-11-13 (×6): 1 mg
  Filled 2016-11-11 (×6): qty 1

## 2016-11-11 MED ORDER — IBUPROFEN 100 MG/5ML PO SUSP
600.0000 mg | Freq: Once | ORAL | Status: AC
  Administered 2016-11-11: 600 mg
  Filled 2016-11-11: qty 30

## 2016-11-11 NOTE — Progress Notes (Signed)
Patient ID: Chase Ayala, male   DOB: 03/29/1990, 26 y.o.   MRN: [id number] NSR 70s I spoke with his family  Violeta GelinasBurke Dowell Hoon, MD, MPH, FACS Trauma: [phone] General Surgery: [phone]

## 2016-11-11 NOTE — Progress Notes (Signed)
OT NOTE  RN STAFF  Please check splint every 2 hours during shift ( remove splint ) to assess for: * pain * redness *swelling *skin break down  If any symptoms above are present remove splint for 15 minutes. If symptoms continue - keep the splint removed and notify OT staff [phone] immediately.   Keep the splinted upper extremity elevated at all times on pillows / towels.  Splint can be cleaned with warm soapy water . Splint should not come in contact with any type of heat because the splint will mold into a new shape.   Splints are to be worn for 2 hours and off for 2 hours  Examples of schedule:  Splints off at 6am Splints on at 8 am Splints off at 10 am Splints on at 12 pm Splints off at 2 pm Splints on at 4 pm    To place the splint on:  1. Place the wrist in position first and secure strap 2. Position each digit and apply strap 3. The thumb and forearm strap should be applied last    The splints should fit as appeared here Strap over the PIP joint of finger Strap over the MCP ( knuckles)  joints of the hand Strap at the thumb Strap at the wrist Strap at the forearm  Doristine Section, MS OTR/L  Pager: [phone]
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
## 2016-11-11 NOTE — Progress Notes (Addendum)
5 Days Post-Op   Subjective/Chief Complaint: He seems to be well with the facial fractures   Objective: Vital signs in last 24 hours: Temp:  [99.1 F (37.3 C)-102.2 F (39 C)] 99.1 F (37.3 C) (08/29 1200) Pulse Rate:  [64-109] 75 (08/29 1300) Resp:  [6-26] 16 (08/29 1300) BP: (125-167)/(55-82) 131/55 (08/29 1300) SpO2:  [93 %-97 %] 96 % (08/29 1300) FiO2 (%):  [40 %] 40 % (08/29 1145) Weight:  [98.8 kg (217 lb 13 oz)] 98.8 kg (217 lb 13 oz) (08/29 0438) Last BM Date:  (pta)  Intake/Output from previous day: 08/28 0701 - 08/29 0700 In: 5816.2 [I.V.:4706.2; NG/GT:1110] Out: 4450 [Urine:4300; Chest Tube:150] Intake/Output this shift: Total I/O In: -  Out: 1200 [Urine:1200]  incision looks good. no excessive swelling. Wires appear tight and occlusion looks good. trach without bleeding.   Lab Results:   Recent Labs  11/10/16 0801 11/11/16 0738  WBC 7.9 9.2  HGB 9.2* 9.1*  HCT 28.0* 28.3*  PLT 204 241   BMET  Recent Labs  11/10/16 0801 11/11/16 0738  NA 140 140  K 3.9 4.0  CL 108 109  CO2 25 27  GLUCOSE 126* 119*  BUN 14 14  CREATININE 0.73 0.66  CALCIUM 8.1* 7.8*   PT/INR No results for input(s): LABPROT, INR in the last 72 hours. ABG No results for input(s): PHART, HCO3 in the last 72 hours.  Invalid input(s): PCO2, PO2  Studies/Results: Dg Chest Port 1 View  Result Date: 11/11/2016 CLINICAL DATA:  Follow-up right-sided pneumothorax EXAM: PORTABLE CHEST 1 VIEW COMPARISON:  Chest x-ray of November 10, 2016 FINDINGS: The lungs are reasonably well expanded. No right-sided pneumothorax is observed. The small caliber chest tube lies along the lateral aspect of the right pleural space at approximately the T6-T7 level. Multiple posterior right rib fractures are again demonstrated. On the left there is persistent increased density obscuring the heart border and lateral aspect of the left hemidiaphragm and increasing the retrocardiac density. The cardiac  silhouette is mildly enlarged. The pulmonary vascularity is not engorged. The tracheostomy tube and is feeding tube appears stable. IMPRESSION: No residual right-sided pneumothorax is observed. The right-sided chest tube is in stable position. There is moderate amount of subcutaneous emphysema on the right. On the left persistent lower lobe atelectasis or pneumonia is present. Electronically Signed   By: David  [place] M.D.   On: 11/11/2016 07:22   Dg Chest Port 1 View  Result Date: 11/10/2016 CLINICAL DATA:  Pneumothorax.  Chest tube. EXAM: PORTABLE CHEST 1 VIEW COMPARISON:  Yesterday FINDINGS: Tracheostomy tube is seated. A feeding tube at least reaches the stomach. Chest tube on the right. Low volume chest with streaky opacities. There is new indistinct appearance along the left heart border from increased opacity. No visible pneumothorax. No visible effusion. Known osseous trauma. Grossly stable soft tissue emphysema. IMPRESSION: 1. Low volume chest with increased atelectasis or impaction along the left heart border. 2. Stable positioning of tubes.  No visible pneumothorax. Electronically Signed   By: Marnee Spring M.D.   On: 11/10/2016 07:44    Anti-infectives: Anti-infectives    Start     Dose/Rate Route Frequency Ordered Stop   11/05/16 0600  ceFAZolin (ANCEF) IVPB 2g/100 mL premix     2 g 200 mL/hr over 30 Minutes Intravenous  Once 11/05/16 0559 11/05/16 0729      Assessment/Plan: s/p Procedure(s) with comments: OPEN REDUCTION INTERNAL FIXATION (ORIF) RIGHT TRIPOD AND MANDIBULAR FRACTURE; CLOSED REDUCTION NASAL FRACTURE (N/A) -  ORIF right orbital fracture, Bilateral maxillary fracture, mandible fracture, Mandibulo-maxillary fixation, tracheostomy TRACHEOSTOMY (N/A) he is doing well with the facial fractures. continue care   LOS: 6 days    Suzanna Obey 11/11/2016
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
## 2016-11-12 ENCOUNTER — Inpatient Hospital Stay (HOSPITAL_COMMUNITY): Payer: Self-pay

## 2016-11-12 LAB — GLUCOSE, CAPILLARY
GLUCOSE-CAPILLARY: 132 mg/dL — AB (ref 65–99)
Glucose-Capillary: 108 mg/dL — ABNORMAL HIGH (ref 65–99)
Glucose-Capillary: 132 mg/dL — ABNORMAL HIGH (ref 65–99)
Glucose-Capillary: 142 mg/dL — ABNORMAL HIGH (ref 65–99)
Glucose-Capillary: 109 mg/dL — ABNORMAL HIGH (ref 65–99)
Glucose-Capillary: 100 mg/dL — ABNORMAL HIGH (ref 65–99)

## 2016-11-12 LAB — URINALYSIS, ROUTINE W REFLEX MICROSCOPIC
Bacteria, UA: NONE SEEN
Bilirubin Urine: NEGATIVE
GLUCOSE, UA: NEGATIVE mg/dL
KETONES UR: NEGATIVE mg/dL
LEUKOCYTES UA: NEGATIVE
Nitrite: NEGATIVE
PROTEIN: 30 mg/dL — AB
SQUAMOUS EPITHELIAL / LPF: NONE SEEN
Specific Gravity, Urine: 1.02 (ref 1.005–1.030)
pH: 6 (ref 5.0–8.0)

## 2016-11-12 LAB — BASIC METABOLIC PANEL
ANION GAP: 5 (ref 5–15)
BUN: 16 mg/dL (ref 6–20)
CALCIUM: 8 mg/dL — AB (ref 8.9–10.3)
CHLORIDE: 107 mmol/L (ref 101–111)
CO2: 28 mmol/L (ref 22–32)
CREATININE: 0.64 mg/dL (ref 0.61–1.24)
GFR calc Af Amer: >60 mL/min (ref >60)
GFR calc non Af Amer: >60 mL/min (ref >60)
GLUCOSE: 136 mg/dL — AB (ref 65–99)
Potassium: 4.2 mmol/L (ref 3.5–5.1)
SODIUM: 140 mmol/L (ref 135–145)

## 2016-11-12 LAB — CBC
HEMATOCRIT: 27 % — AB (ref 39.0–52.0)
HEMOGLOBIN: 8.7 g/dL — AB (ref 13.0–17.0)
MCH: 31.2 pg (ref 26.0–34.0)
MCHC: 32.2 g/dL (ref 30.0–36.0)
MCV: 96.8 fL (ref 78.0–100.0)
PLATELETS: 272 10*3/uL (ref 150–400)
RBC: 2.79 MIL/uL — AB (ref 4.22–5.81)
RDW: 13.7 % (ref 11.5–15.5)
WBC: 7.4 10*3/uL (ref 4.0–10.5)

## 2016-11-12 MED ORDER — PRO-STAT SUGAR FREE PO LIQD
60.0000 mL | Freq: Two times a day (BID) | ORAL | Status: DC
  Administered 2016-11-12 – 2016-11-16 (×9): 60 mL
  Filled 2016-11-12 (×9): qty 60

## 2016-11-12 MED ORDER — PIVOT 1.5 CAL PO LIQD
1000.0000 mL | ORAL | Status: DC
  Administered 2016-11-12 – 2016-11-15 (×5): 1000 mL
  Filled 2016-11-12 (×7): qty 1000

## 2016-11-12 MED ORDER — PIPERACILLIN-TAZOBACTAM 3.375 G IVPB
3.3750 g | Freq: Three times a day (TID) | INTRAVENOUS | Status: DC
  Administered 2016-11-12 – 2016-11-24 (×36): 3.375 g via INTRAVENOUS
  Filled 2016-11-12 (×38): qty 50

## 2016-11-12 NOTE — Progress Notes (Signed)
This note also relates to the following rows which could not be included: SpO2 - Cannot attach notes to unvalidated device data  Pt placed back on full vent support due to increased RR (40's) and WOB.

## 2016-11-12 NOTE — Progress Notes (Signed)
I have provided pt's mother with letters for her and for patient, stating pt is currently hospitalized.  Will follow up with mother when [facility] available; her employer is mailing them to her.    Quintella BatonJulie W. Tya Haughey, RN, BSN  Trauma/Neuro ICU Case Manager [phone]
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
## 2016-11-12 NOTE — Progress Notes (Signed)
Orthopedic Tech Progress Note Patient Details:  Chase Ayala [birth year] [id number]  Ortho Devices Type of Ortho Device: Arm sling Ortho Device/Splint Location: Bilateral prafo boots Ortho Device/Splint Interventions: Application   Saul FordyceJennifer C Jolene Guyett 11/12/2016, 6:53 PM
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
## 2016-11-13 ENCOUNTER — Inpatient Hospital Stay (HOSPITAL_COMMUNITY): Payer: Self-pay

## 2016-11-13 LAB — CBC
HEMATOCRIT: 26.6 % — AB (ref 39.0–52.0)
Hemoglobin: 8.6 g/dL — ABNORMAL LOW (ref 13.0–17.0)
MCH: 31.3 pg (ref 26.0–34.0)
MCHC: 32.3 g/dL (ref 30.0–36.0)
MCV: 96.7 fL (ref 78.0–100.0)
Platelets: 281 10*3/uL (ref 150–400)
RBC: 2.75 MIL/uL — ABNORMAL LOW (ref 4.22–5.81)
RDW: 13.9 % (ref 11.5–15.5)
WBC: 7.3 10*3/uL (ref 4.0–10.5)

## 2016-11-13 LAB — URINE CULTURE: Culture: NO GROWTH

## 2016-11-13 LAB — URINALYSIS, ROUTINE W REFLEX MICROSCOPIC
BACTERIA UA: NONE SEEN
Bilirubin Urine: NEGATIVE
GLUCOSE, UA: NEGATIVE mg/dL
Ketones, ur: NEGATIVE mg/dL
LEUKOCYTES UA: NEGATIVE
NITRITE: NEGATIVE
PH: 5 (ref 5.0–8.0)
PROTEIN: 100 mg/dL — AB
SPECIFIC GRAVITY, URINE: 1.029 (ref 1.005–1.030)

## 2016-11-13 LAB — BASIC METABOLIC PANEL
Anion gap: 8 (ref 5–15)
BUN: 21 mg/dL — AB (ref 6–20)
CHLORIDE: 107 mmol/L (ref 101–111)
CO2: 25 mmol/L (ref 22–32)
Calcium: 7.8 mg/dL — ABNORMAL LOW (ref 8.9–10.3)
Creatinine, Ser: 0.7 mg/dL (ref 0.61–1.24)
GFR calc Af Amer: >60 mL/min (ref >60)
GLUCOSE: 96 mg/dL (ref 65–99)
POTASSIUM: 4.6 mmol/L (ref 3.5–5.1)
Sodium: 140 mmol/L (ref 135–145)

## 2016-11-13 LAB — GLUCOSE, CAPILLARY
GLUCOSE-CAPILLARY: 108 mg/dL — AB (ref 65–99)
GLUCOSE-CAPILLARY: 121 mg/dL — AB (ref 65–99)
Glucose-Capillary: 109 mg/dL — ABNORMAL HIGH (ref 65–99)
Glucose-Capillary: 115 mg/dL — ABNORMAL HIGH (ref 65–99)
Glucose-Capillary: 127 mg/dL — ABNORMAL HIGH (ref 65–99)

## 2016-11-13 MED ORDER — METOPROLOL TARTRATE 25 MG/10 ML ORAL SUSPENSION
25.0000 mg | Freq: Two times a day (BID) | ORAL | Status: DC
  Administered 2016-11-13 – 2016-11-14 (×3): 25 mg
  Filled 2016-11-13 (×3): qty 10

## 2016-11-13 MED ORDER — PIPERACILLIN-TAZOBACTAM 3.375 G IVPB 30 MIN: 3.3750 g | Freq: Three times a day (TID) | INTRAVENOUS | Status: DC

## 2016-11-13 MED ORDER — QUETIAPINE FUMARATE 200 MG PO TABS
200.0000 mg | ORAL_TABLET | Freq: Two times a day (BID) | ORAL | Status: DC
  Administered 2016-11-13 – 2016-11-16 (×7): 200 mg
  Filled 2016-11-13 (×7): qty 1

## 2016-11-13 MED ORDER — CLONAZEPAM 1 MG PO TABS
2.0000 mg | ORAL_TABLET | Freq: Two times a day (BID) | ORAL | Status: DC
  Administered 2016-11-13 – 2016-11-16 (×7): 2 mg
  Filled 2016-11-13 (×7): qty 2

## 2016-11-13 NOTE — Consult Note (Signed)
WOC Nurse wound consult note Reason for Consult:Denuded peristomal skin around new trach site.  Abundant secretions and moisture to this site.  Trach collar is secure and allows little room to apply drain sponge.  Sutures may come out soon and this will help with trach care.  Wound type: New trach site, partial thickness denuded skin from 3 to 9 o'clock around tracheostomy site. Moisture associated skin damage (MASD)from secretions.  Pressure Injury POA: NA Measurement: 0.2 cm pink, denuded skin from 3 to 9 around tracheostomy site.  Wound ZOX:WRUEbed:pink and moist Drainage (amount, consistency, odor) Copious secretions  No odor Periwound:intact but at risk due to moisture Dressing procedure/placement/frequency:Cleanse trach site with NS and dry gauze daily.  Drain sponge to peristomal skin, change three times daily and PRN excessive secretions. Change dressing when moist to prevent skin irritation.  Will not follow at this time.  Please re-consult if needed.  Maple HudsonKaren Araya Roel RN BSN [facility] Pager [phone]

## 2016-11-13 NOTE — Progress Notes (Signed)
OT NOTE  Received new OT order. Pt remains on bedrest. Please update activity orders and page number below if pt appropriate for increased activity. As of 8/30, pt remains intubated and sedated. Thanks  Tri Parish Rehabilitation Hospitalilary Persais Ethridge, OT/L  [phone] 11/13/2016

## 2016-11-13 NOTE — Progress Notes (Signed)
Patient ID: Chase Ayala, male   DOB: [birth year], 26 y.o.   MRN: [id number] Patient purposeful 3 when N sedation remains with a left upper extremity monoparesis  CT head stable continue c-collar  No new neurosurgical recommendations
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
## 2016-11-13 NOTE — Progress Notes (Signed)
SLP Cancellation Note  Patient Details Name: Chase Ayala MRN: [id number] DOB: Jun 08, 1990   Cancelled treatment:       Reason Eval/Treat Not Completed: Patient not medically ready. As of 8/30, pt remains intubated and sedated.   Zhoe Catania, Riley NearingBonnie Caroline 11/13/2016, 9:00 AM
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
## 2016-11-13 NOTE — Progress Notes (Signed)
PT Cancellation Note  Patient Details Name: Chase Ayala MRN: [id number] DOB: November 07, 1990   Cancelled Treatment:    Reason Eval/Treat Not Completed: Medical issues which prohibited therapy; patient remains intubated and sedated on bedrest.  Will attempt again when able to participate.    Chase McgregorCynthia Ayala 11/13/2016, 12:42 PM  Chase Ayala, PT [phone] 11/13/2016

## 2016-11-14 ENCOUNTER — Inpatient Hospital Stay (HOSPITAL_COMMUNITY): Payer: Self-pay

## 2016-11-14 LAB — GLUCOSE, CAPILLARY
GLUCOSE-CAPILLARY: 114 mg/dL — AB (ref 65–99)
GLUCOSE-CAPILLARY: 110 mg/dL — AB (ref 65–99)
Glucose-Capillary: 107 mg/dL — ABNORMAL HIGH (ref 65–99)
Glucose-Capillary: 109 mg/dL — ABNORMAL HIGH (ref 65–99)
Glucose-Capillary: 116 mg/dL — ABNORMAL HIGH (ref 65–99)
Glucose-Capillary: 117 mg/dL — ABNORMAL HIGH (ref 65–99)
Glucose-Capillary: 99 mg/dL (ref 65–99)

## 2016-11-14 LAB — CBC
HEMATOCRIT: 25.8 % — AB (ref 39.0–52.0)
HEMOGLOBIN: 8.4 g/dL — AB (ref 13.0–17.0)
MCH: 30.9 pg (ref 26.0–34.0)
MCHC: 32.6 g/dL (ref 30.0–36.0)
MCV: 94.9 fL (ref 78.0–100.0)
Platelets: 359 10*3/uL (ref 150–400)
RBC: 2.72 MIL/uL — AB (ref 4.22–5.81)
RDW: 14.4 % (ref 11.5–15.5)
WBC: 9 10*3/uL (ref 4.0–10.5)

## 2016-11-14 LAB — CULTURE, RESPIRATORY W GRAM STAIN

## 2016-11-14 LAB — CULTURE, RESPIRATORY

## 2016-11-14 LAB — URINE CULTURE
CULTURE: NO GROWTH
SPECIAL REQUESTS: NORMAL

## 2016-11-14 LAB — BASIC METABOLIC PANEL
Anion gap: 7 (ref 5–15)
BUN: 18 mg/dL (ref 6–20)
CHLORIDE: 104 mmol/L (ref 101–111)
CO2: 28 mmol/L (ref 22–32)
CREATININE: 0.71 mg/dL (ref 0.61–1.24)
Calcium: 8.1 mg/dL — ABNORMAL LOW (ref 8.9–10.3)
GFR calc non Af Amer: >60 mL/min (ref >60)
Glucose, Bld: 106 mg/dL — ABNORMAL HIGH (ref 65–99)
Potassium: 4.3 mmol/L (ref 3.5–5.1)
SODIUM: 139 mmol/L (ref 135–145)

## 2016-11-14 LAB — MRSA PCR SCREENING: MRSA by PCR: POSITIVE — AB

## 2016-11-14 LAB — TRIGLYCERIDES: Triglycerides: 105 mg/dL (ref <150)

## 2016-11-14 MED ORDER — VANCOMYCIN HCL 10 G IV SOLR
2000.0000 mg | Freq: Once | INTRAVENOUS | Status: AC
  Administered 2016-11-14: 2000 mg via INTRAVENOUS
  Filled 2016-11-14: qty 2000

## 2016-11-14 MED ORDER — MUPIROCIN 2 % EX OINT
1.0000 "application " | TOPICAL_OINTMENT | Freq: Two times a day (BID) | CUTANEOUS | Status: AC
  Administered 2016-11-14 – 2016-11-19 (×10): 1 via NASAL
  Filled 2016-11-14: qty 22

## 2016-11-14 MED ORDER — CHLORHEXIDINE GLUCONATE CLOTH 2 % EX PADS
6.0000 | MEDICATED_PAD | Freq: Every day | CUTANEOUS | Status: DC
  Administered 2016-11-15 – 2016-11-16 (×2): 6 via TOPICAL

## 2016-11-14 MED ORDER — METOPROLOL TARTRATE 25 MG/10 ML ORAL SUSPENSION
50.0000 mg | Freq: Two times a day (BID) | ORAL | Status: DC
  Administered 2016-11-14: 50 mg
  Filled 2016-11-14: qty 20

## 2016-11-14 MED ORDER — VANCOMYCIN HCL IN DEXTROSE 1-5 GM/200ML-% IV SOLN
1000.0000 mg | Freq: Three times a day (TID) | INTRAVENOUS | Status: DC
  Administered 2016-11-15 (×2): 1000 mg via INTRAVENOUS
  Filled 2016-11-14 (×3): qty 200

## 2016-11-14 MED ORDER — GUAIFENESIN 100 MG/5ML PO SOLN
15.0000 mL | Freq: Four times a day (QID) | ORAL | Status: DC
  Administered 2016-11-14 – 2016-11-17 (×10): 300 mg
  Filled 2016-11-14: qty 15
  Filled 2016-11-14 (×2): qty 5
  Filled 2016-11-14 (×7): qty 15
  Filled 2016-11-14: qty 5
  Filled 2016-11-14 (×4): qty 15
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
## 2016-11-14 NOTE — Progress Notes (Signed)
CRITICAL VALUE ALERT  Critical Value:  MRSA + in nares  Date & Time Notied:  11/14/2016 1608  Provider Notified: Dr. Janee Mornhompson  Orders Received/Actions taken: Placed order for Vancomycin per pharmacy and placed pt on contact precautions.
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
## 2016-11-14 NOTE — Progress Notes (Signed)
Pt's earring given to pt's mother to take home.
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
## 2016-11-15 ENCOUNTER — Inpatient Hospital Stay (HOSPITAL_COMMUNITY): Payer: Self-pay

## 2016-11-15 LAB — CBC
HCT: 27.6 % — ABNORMAL LOW (ref 39.0–52.0)
Hemoglobin: 9.1 g/dL — ABNORMAL LOW (ref 13.0–17.0)
MCH: 31.2 pg (ref 26.0–34.0)
MCHC: 33 g/dL (ref 30.0–36.0)
MCV: 94.5 fL (ref 78.0–100.0)
PLATELETS: 403 10*3/uL — AB (ref 150–400)
RBC: 2.92 MIL/uL — AB (ref 4.22–5.81)
RDW: 14.4 % (ref 11.5–15.5)
WBC: 9.9 10*3/uL (ref 4.0–10.5)

## 2016-11-15 LAB — BASIC METABOLIC PANEL
ANION GAP: 8 (ref 5–15)
BUN: 17 mg/dL (ref 6–20)
CALCIUM: 8.1 mg/dL — AB (ref 8.9–10.3)
CO2: 24 mmol/L (ref 22–32)
Chloride: 105 mmol/L (ref 101–111)
Creatinine, Ser: 0.62 mg/dL (ref 0.61–1.24)
GLUCOSE: 120 mg/dL — AB (ref 65–99)
POTASSIUM: 4.5 mmol/L (ref 3.5–5.1)
Sodium: 137 mmol/L (ref 135–145)

## 2016-11-15 LAB — GLUCOSE, CAPILLARY
GLUCOSE-CAPILLARY: 114 mg/dL — AB (ref 65–99)
GLUCOSE-CAPILLARY: 97 mg/dL (ref 65–99)
GLUCOSE-CAPILLARY: 114 mg/dL — AB (ref 65–99)
GLUCOSE-CAPILLARY: 120 mg/dL — AB (ref 65–99)
Glucose-Capillary: 137 mg/dL — ABNORMAL HIGH (ref 65–99)
Glucose-Capillary: 108 mg/dL — ABNORMAL HIGH (ref 65–99)
Glucose-Capillary: 122 mg/dL — ABNORMAL HIGH (ref 65–99)

## 2016-11-15 LAB — CULTURE, RESPIRATORY

## 2016-11-15 LAB — CULTURE, RESPIRATORY W GRAM STAIN: Special Requests: NORMAL

## 2016-11-15 MED ORDER — METOPROLOL TARTRATE 25 MG/10 ML ORAL SUSPENSION
100.0000 mg | Freq: Two times a day (BID) | ORAL | Status: DC
  Administered 2016-11-15 – 2016-11-16 (×4): 100 mg
  Filled 2016-11-15 (×4): qty 40

## 2016-11-15 MED ORDER — ACETAMINOPHEN 160 MG/5ML PO SOLN
650.0000 mg | Freq: Once | ORAL | Status: AC
  Administered 2016-11-15: 650 mg

## 2016-11-15 MED ORDER — METOPROLOL TARTRATE 5 MG/5ML IV SOLN
5.0000 mg | Freq: Four times a day (QID) | INTRAVENOUS | Status: DC | PRN
  Administered 2016-11-15 – 2016-11-17 (×3): 5 mg via INTRAVENOUS
  Filled 2016-11-15 (×5): qty 5

## 2016-11-15 MED ORDER — METOPROLOL TARTRATE 5 MG/5ML IV SOLN
10.0000 mg | Freq: Once | INTRAVENOUS | Status: AC
  Administered 2016-11-15: 10 mg via INTRAVENOUS
  Filled 2016-11-15: qty 10

## 2016-11-15 MED ORDER — METOPROLOL TARTRATE 5 MG/5ML IV SOLN
5.0000 mg | Freq: Once | INTRAVENOUS | Status: AC
  Administered 2016-11-15: 5 mg via INTRAVENOUS
  Filled 2016-11-15: qty 5

## 2016-11-15 MED ORDER — VANCOMYCIN HCL IN DEXTROSE 1-5 GM/200ML-% IV SOLN
1000.0000 mg | Freq: Three times a day (TID) | INTRAVENOUS | Status: DC
  Administered 2016-11-15 – 2016-11-16 (×4): 1000 mg via INTRAVENOUS
  Filled 2016-11-15 (×5): qty 200

## 2016-11-15 NOTE — Progress Notes (Signed)
Fever to 103.6 today Sedated on ventilator Scalp incisions and trauma appears to be healing well No evidence of CSF leak Hard collar for C7 fracture
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
## 2016-11-16 LAB — BASIC METABOLIC PANEL
ANION GAP: 8 (ref 5–15)
BUN: 21 mg/dL — ABNORMAL HIGH (ref 6–20)
CALCIUM: 7.9 mg/dL — AB (ref 8.9–10.3)
CO2: 23 mmol/L (ref 22–32)
CREATININE: 0.68 mg/dL (ref 0.61–1.24)
Chloride: 107 mmol/L (ref 101–111)
GFR calc Af Amer: >60 mL/min (ref >60)
GFR calc non Af Amer: >60 mL/min (ref >60)
Glucose, Bld: 114 mg/dL — ABNORMAL HIGH (ref 65–99)
Potassium: 4.4 mmol/L (ref 3.5–5.1)
SODIUM: 138 mmol/L (ref 135–145)

## 2016-11-16 LAB — CBC
HEMATOCRIT: 26.5 % — AB (ref 39.0–52.0)
HEMOGLOBIN: 8.6 g/dL — AB (ref 13.0–17.0)
MCH: 31 pg (ref 26.0–34.0)
MCHC: 32.5 g/dL (ref 30.0–36.0)
MCV: 95.7 fL (ref 78.0–100.0)
Platelets: 322 10*3/uL (ref 150–400)
RBC: 2.77 MIL/uL — ABNORMAL LOW (ref 4.22–5.81)
RDW: 14.8 % (ref 11.5–15.5)
WBC: 14 10*3/uL — ABNORMAL HIGH (ref 4.0–10.5)

## 2016-11-16 LAB — GLUCOSE, CAPILLARY
GLUCOSE-CAPILLARY: 107 mg/dL — AB (ref 65–99)
GLUCOSE-CAPILLARY: 116 mg/dL — AB (ref 65–99)
GLUCOSE-CAPILLARY: 105 mg/dL — AB (ref 65–99)
Glucose-Capillary: 118 mg/dL — ABNORMAL HIGH (ref 65–99)
Glucose-Capillary: 127 mg/dL — ABNORMAL HIGH (ref 65–99)
Glucose-Capillary: 137 mg/dL — ABNORMAL HIGH (ref 65–99)

## 2016-11-16 LAB — VANCOMYCIN, TROUGH: VANCOMYCIN TR: 10 ug/mL — AB (ref 15–20)

## 2016-11-16 MED ORDER — MAGNESIUM CITRATE PO SOLN
0.5000 | Freq: Once | ORAL | Status: AC
  Administered 2016-11-16: 0.5
  Filled 2016-11-16: qty 296

## 2016-11-16 MED ORDER — IPRATROPIUM-ALBUTEROL 0.5-2.5 (3) MG/3ML IN SOLN
3.0000 mL | Freq: Four times a day (QID) | RESPIRATORY_TRACT | Status: DC
  Administered 2016-11-16 – 2016-11-30 (×55): 3 mL via RESPIRATORY_TRACT
  Filled 2016-11-16 (×56): qty 3

## 2016-11-16 MED ORDER — VANCOMYCIN HCL 10 G IV SOLR
1500.0000 mg | Freq: Three times a day (TID) | INTRAVENOUS | Status: DC
  Administered 2016-11-17 – 2016-11-19 (×7): 1500 mg via INTRAVENOUS
  Filled 2016-11-16 (×10): qty 1500

## 2016-11-16 MED ORDER — IPRATROPIUM-ALBUTEROL 0.5-2.5 (3) MG/3ML IN SOLN: 3.0000 mL | RESPIRATORY_TRACT | Status: DC | PRN

## 2016-11-16 MED ORDER — METOCLOPRAMIDE HCL 5 MG/ML IJ SOLN
10.0000 mg | Freq: Four times a day (QID) | INTRAMUSCULAR | Status: DC
  Administered 2016-11-16 – 2016-11-17 (×4): 10 mg via INTRAVENOUS
  Filled 2016-11-16 (×4): qty 2
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
## 2016-11-16 NOTE — Plan of Care (Signed)
Problem: Nutritional: Goal: Risk of aspiration will decrease Outcome: Not Progressing Tube feeding on hold for distension and vomiting. Optimal positioning and oral care in progress to lower risk of aspiration.
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
## 2016-11-16 NOTE — Progress Notes (Signed)
Rehab Admissions Coordinator Note:  Patient was screened by Clois DupesBoyette, Arleny Kruger Godwin for appropriateness for an Inpatient Acute Rehab Consult per SLP recommendation. Noted Ranchos II emerging II. Await PT and OT evals and further progress before requesting an inpt rehab consult. Vented.Clois Dupes.  Dequon Schnebly Godwin 11/16/2016, 4:31 PM  I can be reached at [phone].

## 2016-11-16 NOTE — Progress Notes (Signed)
Fever is improving substantially Patient is showing some movement in 3-4 weeks Gen remedies and appears to be following commands Continues to improve with aggressive care
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
## 2016-11-16 NOTE — Evaluation (Signed)
TBI TEAM evaluation: Speech Language Pathology Evaluation Patient Details Name: Chase Ayala MRN: [id number] DOB: 04-06-1990 Today's Date: 11/16/2016 Time: [id number] SLP Time Calculation (min) (ACUTE ONLY): 36 min  Problem List:  Patient Active Problem List   Diagnosis Date Noted  . 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 Right scapula fracture 11/09/2016  . Open skull fracture (HCC) 11/05/2016   Past Medical History:  Past Medical History:  Diagnosis Date  . 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 Right scapula fracture 11/09/2016   Past Surgical History:  Past Surgical History:  Procedure Laterality Date  . ORIF MANDIBULAR FRACTURE N/A 11/06/2016   Procedure: OPEN REDUCTION INTERNAL FIXATION (ORIF) RIGHT TRIPOD AND MANDIBULAR FRACTURE; CLOSED REDUCTION NASAL FRACTURE;  Surgeon: Suzanna ObeyByers, John, MD;  [facility];  Service: ENT;  Laterality: N/A;  ORIF right orbital fracture, Bilateral maxillary fracture, mandible fracture, Mandibulo-maxillary fixation, tracheostomy  . TRACHEOSTOMY TUBE PLACEMENT N/A 11/06/2016   Procedure: TRACHEOSTOMY;  Surgeon: Suzanna ObeyByers, John, MD;  [facility];  Service: ENT;  Laterality: N/A;   HPI:  see TBI team eval   Assessment / Plan / Recommendation Clinical Impression  Patient presents with behaviors consistent with a Rancho Level II (generalized) with emerging Rancho Level III (localized response) as indicated by ability to withdraw consistently to painful stimuli in 3/4 extremities. Otherwise, generalized responses noted with increased movement of all extremities with verbal and tactile stimuli. Patient will benefit from acute ST treatment to maximize cognitive recovery in the context of a healing brain.     SLP Assessment  SLP Recommendation/Assessment: Patient needs continued Speech Lanaguage Pathology Services SLP Visit Diagnosis: Cognitive communication deficit (R41.841)    Follow Up Recommendations  Inpatient Rehab    Frequency and  Duration min 3x week  2 weeks      SLP Evaluation Cognition  Overall Cognitive Status: Impaired/Different from baseline Arousal/Alertness: Lethargic Orientation Level: Disoriented to place;Disoriented to time;Disoriented to situation Attention: Focused Focused Attention: Impaired Focused Attention Impairment: Verbal basic Memory: Impaired Awareness: Impaired Awareness Impairment: Intellectual impairment Problem Solving: Impaired Problem Solving Impairment: Verbal basic;Functional basic Behaviors: Restless Rancho BiographySeries.dkos Amigos Scales of Cognitive Functioning: Generalized response (emerging III )       Comprehension  Auditory Comprehension Overall Auditory Comprehension: Impaired Visual Recognition/Discrimination Discrimination: Not tested Reading Comprehension Reading Status: Not tested    Expression Verbal Expression Overall Verbal Expression:  (non-verbal)   Oral / Motor  Oral Motor/Sensory Function Overall Oral Motor/Sensory Function: Other (comment) (no movement of lips, jaw wired)   GO            Ferdinand LangoLeah Syon Tews MA, CCC-SLP [phone]         Jeily Guthridge Meryl 11/16/2016, 4:29 PM

## 2016-11-17 ENCOUNTER — Inpatient Hospital Stay (HOSPITAL_COMMUNITY): Payer: Self-pay

## 2016-11-17 LAB — GLUCOSE, CAPILLARY
GLUCOSE-CAPILLARY: 92 mg/dL (ref 65–99)
Glucose-Capillary: 119 mg/dL — ABNORMAL HIGH (ref 65–99)
Glucose-Capillary: 103 mg/dL — ABNORMAL HIGH (ref 65–99)
Glucose-Capillary: 103 mg/dL — ABNORMAL HIGH (ref 65–99)
Glucose-Capillary: 105 mg/dL — ABNORMAL HIGH (ref 65–99)
Glucose-Capillary: 105 mg/dL — ABNORMAL HIGH (ref 65–99)

## 2016-11-17 LAB — CULTURE, BLOOD (ROUTINE X 2)
Culture: NO GROWTH
Culture: NO GROWTH
SPECIAL REQUESTS: ADEQUATE
Special Requests: ADEQUATE

## 2016-11-17 LAB — CBC
HEMATOCRIT: 27.3 % — AB (ref 39.0–52.0)
HEMOGLOBIN: 9.1 g/dL — AB (ref 13.0–17.0)
MCH: 31.9 pg (ref 26.0–34.0)
MCHC: 33.3 g/dL (ref 30.0–36.0)
MCV: 95.8 fL (ref 78.0–100.0)
Platelets: 379 10*3/uL (ref 150–400)
RBC: 2.85 MIL/uL — ABNORMAL LOW (ref 4.22–5.81)
RDW: 15 % (ref 11.5–15.5)
WBC: 3.8 10*3/uL — ABNORMAL LOW (ref 4.0–10.5)

## 2016-11-17 LAB — BASIC METABOLIC PANEL
Anion gap: 6 (ref 5–15)
BUN: 21 mg/dL — ABNORMAL HIGH (ref 6–20)
CHLORIDE: 105 mmol/L (ref 101–111)
CO2: 25 mmol/L (ref 22–32)
CREATININE: 0.58 mg/dL — AB (ref 0.61–1.24)
Calcium: 7.9 mg/dL — ABNORMAL LOW (ref 8.9–10.3)
GFR calc Af Amer: >60 mL/min (ref >60)
GFR calc non Af Amer: >60 mL/min (ref >60)
GLUCOSE: 111 mg/dL — AB (ref 65–99)
Potassium: 4.9 mmol/L (ref 3.5–5.1)
Sodium: 136 mmol/L (ref 135–145)

## 2016-11-17 LAB — TRIGLYCERIDES: Triglycerides: 914 mg/dL — ABNORMAL HIGH (ref <150)

## 2016-11-17 MED ORDER — PANTOPRAZOLE SODIUM 40 MG IV SOLR
40.0000 mg | Freq: Two times a day (BID) | INTRAVENOUS | Status: DC
  Administered 2016-11-17 – 2016-12-11 (×49): 40 mg via INTRAVENOUS
  Filled 2016-11-17 (×49): qty 40

## 2016-11-17 MED ORDER — SODIUM CHLORIDE 0.9 % IV SOLN
2.0000 mg/h | INTRAVENOUS | Status: DC
  Administered 2016-11-17: 2 mg/h via INTRAVENOUS
  Administered 2016-11-17 – 2016-11-18 (×3): 6 mg/h via INTRAVENOUS
  Filled 2016-11-17 (×4): qty 10

## 2016-11-17 MED ORDER — CHLORHEXIDINE GLUCONATE CLOTH 2 % EX PADS
6.0000 | MEDICATED_PAD | Freq: Every day | CUTANEOUS | Status: AC
  Administered 2016-11-17 – 2016-11-18 (×2): 6 via TOPICAL

## 2016-11-17 MED ORDER — METOPROLOL TARTRATE 5 MG/5ML IV SOLN
5.0000 mg | Freq: Four times a day (QID) | INTRAVENOUS | Status: DC
  Administered 2016-11-17: 5 mg via INTRAVENOUS
  Administered 2016-11-17: 10 mg via INTRAVENOUS
  Administered 2016-11-17 – 2016-12-15 (×97): 5 mg via INTRAVENOUS
  Filled 2016-11-17 (×106): qty 5

## 2016-11-17 MED ORDER — ACETAMINOPHEN 10 MG/ML IV SOLN
1000.0000 mg | Freq: Once | INTRAVENOUS | Status: AC
  Administered 2016-11-17: 1000 mg via INTRAVENOUS
  Filled 2016-11-17: qty 100

## 2016-11-17 MED ORDER — LORAZEPAM 2 MG/ML IJ SOLN
INTRAMUSCULAR | Status: AC
  Administered 2016-11-17: 0.5 mg via INTRAVENOUS
  Filled 2016-11-17: qty 1

## 2016-11-17 MED ORDER — METOPROLOL TARTRATE 5 MG/5ML IV SOLN
10.0000 mg | Freq: Once | INTRAVENOUS | Status: AC
  Administered 2016-11-17: 10 mg via INTRAVENOUS

## 2016-11-17 MED ORDER — ACETAMINOPHEN 650 MG RE SUPP
650.0000 mg | Freq: Four times a day (QID) | RECTAL | Status: DC | PRN
  Administered 2016-11-17 – 2016-12-01 (×6): 650 mg via RECTAL
  Filled 2016-11-17 (×8): qty 1

## 2016-11-17 MED ORDER — LORAZEPAM 2 MG/ML IJ SOLN
0.5000 mg | Freq: Once | INTRAMUSCULAR | Status: AC
  Administered 2016-11-17: 0.5 mg via INTRAVENOUS

## 2016-11-17 NOTE — Progress Notes (Signed)
Follow up - Trauma and Critical Care  Patient Details:    Chase Ayala is an 26 y.o. male.  Lines/tubes : Urethral Catheter Carilyn Goodpasture, RN Double-lumen 16 Fr. (Active)  Indication for Insertion or Continuance of Catheter Acute urinary retention 11/17/2016  7:59 AM  Site Assessment Clean;Dry;Intact 11/16/2016  8:00 PM  Catheter Maintenance Bag below level of bladder;Catheter secured;Drainage bag/tubing not touching floor;Insertion date on drainage bag;No dependent loops;Seal intact 11/16/2016  8:00 PM  Collection Container Standard drainage bag 11/16/2016  8:00 PM  Securement Method Leg strap 11/16/2016  8:00 PM  Urinary Catheter Interventions Unclamped 11/16/2016  8:00 AM  Output (mL) 60 mL 11/17/2016  6:00 AM    Microbiology/Sepsis markers: Results for orders placed or performed during the hospital encounter of 11/05/16  Culture, Urine     Status: None   Collection Time: 11/12/16 10:34 AM  Result Value Ref Range Status   Specimen Description URINE, CATHETERIZED  Final   Special Requests NONE  Final   Culture NO GROWTH  Final   Report Status 11/13/2016 FINAL  Final  Culture, respiratory (NON-Expectorated)     Status: Abnormal   Collection Time: 11/12/16 11:11 AM  Result Value Ref Range Status   Specimen Description TRACHEAL ASPIRATE  Final   Special Requests NONE  Final   Gram Stain   Final    RARE SQUAMOUS EPITHELIAL CELLS PRESENT FEW WBC PRESENT,BOTH PMN AND MONONUCLEAR RARE GRAM NEGATIVE RODS RARE GRAM POSITIVE COCCI RARE GRAM NEGATIVE COCCI    Culture MULTIPLE ORGANISMS PRESENT, NONE PREDOMINANT (A)  Final   Report Status 11/14/2016 FINAL  Final  Culture, blood (routine x 2)     Status: None (Preliminary result)   Collection Time: 11/12/16 11:18 AM  Result Value Ref Range Status   Specimen Description BLOOD LEFT HAND  Final   Special Requests IN PEDIATRIC BOTTLE Blood Culture adequate volume  Final   Culture NO GROWTH 4 DAYS  Final   Report Status PENDING  Incomplete   Culture, blood (routine x 2)     Status: None (Preliminary result)   Collection Time: 11/12/16 11:22 AM  Result Value Ref Range Status   Specimen Description BLOOD LEFT ANTECUBITAL  Final   Special Requests IN PEDIATRIC BOTTLE Blood Culture adequate volume  Final   Culture NO GROWTH 4 DAYS  Final   Report Status PENDING  Incomplete  Culture, respiratory (NON-Expectorated)     Status: Abnormal   Collection Time: 11/13/16 12:10 PM  Result Value Ref Range Status   Specimen Description TRACHEAL ASPIRATE  Final   Special Requests Normal  Final   Gram Stain   Final    RARE WBC PRESENT,BOTH PMN AND MONONUCLEAR FEW GRAM VARIABLE ROD RARE GRAM POSITIVE COCCI IN PAIRS    Culture MULTIPLE ORGANISMS PRESENT, NONE PREDOMINANT (A)  Final   Report Status 11/15/2016 FINAL  Final  Culture, blood (Routine X 2) w Reflex to ID Panel     Status: None (Preliminary result)   Collection Time: 11/13/16 12:40 PM  Result Value Ref Range Status   Specimen Description BLOOD RIGHT ANTECUBITAL  Final   Special Requests IN PEDIATRIC BOTTLE Blood Culture adequate volume  Final   Culture NO GROWTH 3 DAYS  Final   Report Status PENDING  Incomplete  Culture, blood (Routine X 2) w Reflex to ID Panel     Status: None (Preliminary result)   Collection Time: 11/13/16 12:40 PM  Result Value Ref Range Status   Specimen Description BLOOD LEFT  ANTECUBITAL  Final   Special Requests IN PEDIATRIC BOTTLE Blood Culture adequate volume  Final   Culture NO GROWTH 3 DAYS  Final   Report Status PENDING  Incomplete  Culture, Urine     Status: None   Collection Time: 11/13/16  1:56 PM  Result Value Ref Range Status   Specimen Description URINE, CATHETERIZED  Final   Special Requests Normal  Final   Culture NO GROWTH  Final   Report Status 11/14/2016 FINAL  Final  MRSA PCR Screening     Status: Abnormal   Collection Time: 11/14/16  1:43 PM  Result Value Ref Range Status   MRSA by PCR POSITIVE (A) NEGATIVE Final    Comment:         The GeneXpert MRSA Assay (FDA approved for NASAL specimens only), is one component of a comprehensive MRSA colonization surveillance program. It is not intended to diagnose MRSA infection nor to guide or monitor treatment for MRSA infections. RESULT CALLED TO, READ BACK BY AND VERIFIED WITH: M. BERGMAN 1603 09.01.2018 N. MORRIS     Anti-infectives:  Anti-infectives    Start     Dose/Rate Route Frequency Ordered Stop   11/17/16 0000  vancomycin (VANCOCIN) 1,500 mg in sodium chloride 0.9 % 500 mL IVPB     1,500 mg 250 mL/hr over 120 Minutes Intravenous Every 8 hours 11/16/16 1700     11/15/16 1600  vancomycin (VANCOCIN) IVPB 1000 mg/200 mL premix  Status:  Discontinued     1,000 mg 200 mL/hr over 60 Minutes Intravenous Every 8 hours 11/15/16 1332 11/16/16 1700   11/15/16 0130  vancomycin (VANCOCIN) IVPB 1000 mg/200 mL premix  Status:  Discontinued     1,000 mg 200 mL/hr over 60 Minutes Intravenous Every 8 hours 11/14/16 1628 11/15/16 1332   11/14/16 1730  vancomycin (VANCOCIN) 2,000 mg in sodium chloride 0.9 % 500 mL IVPB     2,000 mg 250 mL/hr over 120 Minutes Intravenous  Once 11/14/16 1628 11/14/16 2042   11/13/16 1400  piperacillin-tazobactam (ZOSYN) IVPB 3.375 g  Status:  Discontinued     3.375 g 100 mL/hr over 30 Minutes Intravenous Every 8 hours 11/13/16 1115 11/13/16 1121   11/12/16 1130  piperacillin-tazobactam (ZOSYN) IVPB 3.375 g     3.375 g 12.5 mL/hr over 240 Minutes Intravenous Every 8 hours 11/12/16 1038     11/05/16 0600  ceFAZolin (ANCEF) IVPB 2g/100 mL premix     2 g 200 mL/hr over 30 Minutes Intravenous  Once 11/05/16 0559 11/05/16 [id number]      Best Practice/Protocols:  VTE Prophylaxis: Lovenox (prophylaxtic dose) and Mechanical GI Prophylaxis: Proton Pump Inhibitor Continous Sedation  Consults: Treatment Team:  Donalee Citrinram, Gary, MD Myrene GalasHandy, Michael, MD Serena Colonelosen, Jefry, MD    Events:  Subjective:    Overnight Issues: Patiente with a significant amount  of drainage around his tracheostomy and abdomen is very distended.  Coffee-ground emesis yesterdya.  Tube feedings have been off.  Objective:  Vital signs for last 24 hours: Temp:  [99.4 F (37.4 C)-101.2 F (38.4 C)] 100.1 F (37.8 C) (09/04 0400) Pulse Rate:  [33-140] 140 (09/04 0844) Resp:  [10-27] 24 (09/04 0844) BP: (104-167)/(59-111) 142/84 (09/04 0844) SpO2:  [97 %-100 %] 100 % (09/04 0844) FiO2 (%):  [40 %] 40 % (09/04 0844) Weight:  [111 kg (244 lb 11.4 oz)] 111 kg (244 lb 11.4 oz) (09/04 0500)  Hemodynamic parameters for last 24 hours:    Intake/Output from previous day: 09/03 0701 -  09/04 0700 In: 4377.2 [I.V.:3727.2; IV Piggyback:650] Out: 1810 [Urine:1810]  Intake/Output this shift: No intake/output data recorded.  Vent settings for last 24 hours: Vent Mode: PRVC FiO2 (%):  [40 %] 40 % Set Rate:  [16 bmp] 16 bmp Vt Set:  [560 mL] 560 mL PEEP:  [5 cmH20] 5 cmH20 Plateau Pressure:  [30 cmH20-36 cmH20] 30 cmH20  Physical Exam:  General: alert, no respiratory distress and agitated Neuro: alert, oriented, nonfocal exam, RASS 0 and agitated Resp: clear to auscultation bilaterally and peak airway pressure are high.  Probably because of abdominal distention. CVS: Sinus tachycardia GI: tender, distended and hypoactive BS Extremities: no edema, no erythema, pulses WNL  Results for orders placed or performed during the hospital encounter of 11/05/16 (from the past 24 hour(s))  Glucose, capillary     Status: Abnormal   Collection Time: 11/16/16 12:14 PM  Result Value Ref Range   Glucose-Capillary 127 (H) 65 - 99 mg/dL  Vancomycin, trough     Status: Abnormal   Collection Time: 11/16/16  3:50 PM  Result Value Ref Range   Vancomycin Tr 10 (L) 15 - 20 ug/mL  Glucose, capillary     Status: Abnormal   Collection Time: 11/16/16  5:06 PM  Result Value Ref Range   Glucose-Capillary 137 (H) 65 - 99 mg/dL  Glucose, capillary     Status: Abnormal   Collection Time:  11/16/16  8:30 PM  Result Value Ref Range   Glucose-Capillary 105 (H) 65 - 99 mg/dL  Glucose, capillary     Status: Abnormal   Collection Time: 11/16/16 11:54 PM  Result Value Ref Range   Glucose-Capillary 118 (H) 65 - 99 mg/dL  Glucose, capillary     Status: Abnormal   Collection Time: 11/17/16  4:11 AM  Result Value Ref Range   Glucose-Capillary 119 (H) 65 - 99 mg/dL  CBC     Status: Abnormal   Collection Time: 11/17/16  6:13 AM  Result Value Ref Range   WBC 3.8 (L) 4.0 - 10.5 K/uL   RBC 2.85 (L) 4.22 - 5.81 MIL/uL   Hemoglobin 9.1 (L) 13.0 - 17.0 g/dL   HCT 40.9 (L) 81.1 - 91.4 %   MCV 95.8 78.0 - 100.0 fL   MCH 31.9 26.0 - 34.0 pg   MCHC 33.3 30.0 - 36.0 g/dL   RDW 78.2 95.6 - 21.3 %   Platelets 379 150 - 400 K/uL  Basic metabolic panel     Status: Abnormal   Collection Time: 11/17/16  6:13 AM  Result Value Ref Range   Sodium 136 135 - 145 mmol/L   Potassium 4.9 3.5 - 5.1 mmol/L   Chloride 105 101 - 111 mmol/L   CO2 25 22 - 32 mmol/L   Glucose, Bld 111 (H) 65 - 99 mg/dL   BUN 21 (H) 6 - 20 mg/dL   Creatinine, Ser 0.86 (L) 0.61 - 1.24 mg/dL   Calcium 7.9 (L) 8.9 - 10.3 mg/dL   GFR calc non Af Amer >60 >60 mL/min   GFR calc Af Amer >60 >60 mL/min   Anion gap 6 5 - 15  Triglycerides     Status: Abnormal   Collection Time: 11/17/16  6:13 AM  Result Value Ref Range   Triglycerides 914 (H) <150 mg/dL     Assessment/Plan:   NEURO  Altered Mental Status:  agitation and sedation   Plan: Hass not been able to wean very much lately  PULM  Atelectasis/collapse (bibasilar)  Plan: Wean as tolerated.  CARDIO  Sinus Tachycardia   Plan: IV beta blocker  RENAL  Urine output nad renal funciton are okay.  BUN up a bit   Plan: CPM  GI  Abdomen distended, no bowel sounds.  NGT palced and larege amount of coffee-ground emesis returned.   Plan: NGT decompression.  ID  No changes   Plan: CPM  HEME  Anemia acute blood loss anemia and anemia of critical illness)   Plan: No  blood needed for now.  ENDO No known issues.   Plan: CPM  Global Issues  NGT to LIS.  Change to several IV medications.  KUB.  Janina Mayo cite should improve with NGT    LOS: 12 days   Additional comments:I reviewed the patient's new clinical lab test results. cbc/bmet, I reviewed the patients new imaging test results. CXR/KUB and I have discussed and reviewed with family members patient's mother.  Critical Care Total Time*: 45 Minutes  Bosco Paparella 11/17/2016  *Care during the described time interval was provided by me and/or other providers on the critical care team.  I have reviewed this patient's available data, including medical history, events of note, physical examination and test results as part of my evaluation.

## 2016-11-17 NOTE — Progress Notes (Signed)
Patient restless and HR sustaining 130s-150 despite sedation/narcotic drips, prn versed and scheduled lopressor. MD made aware, order received for Ativan x1. Will continue to monitor.
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
## 2016-11-17 NOTE — Plan of Care (Signed)
Problem: Skin Integrity: Goal: Risk for impaired skin integrity will decrease Outcome: Not Progressing MSAD around trach site, oozing coffee ground secretions, MD aware, TF remains off

## 2016-11-17 NOTE — Progress Notes (Signed)
Patient's HR remains tachycardic in the 150s-160, BP 140s. Temp increased to 101. Order received for one time dose of lopressor IV. Patient on cooling blanket. Will continue to monitor.
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
## 2016-11-17 NOTE — Progress Notes (Signed)
Called MD regarding persistent HR 140s-low 150s, restlessness and RR elevation despite dose of Ativan and full dose of IV sedation drips. Order received to d/c propofol and start Versed drip. Will continue to monitor. Mom at bedside, updated.

## 2016-11-18 ENCOUNTER — Inpatient Hospital Stay (HOSPITAL_COMMUNITY): Payer: Self-pay

## 2016-11-18 ENCOUNTER — Inpatient Hospital Stay (HOSPITAL_COMMUNITY): Payer: Self-pay | Admitting: Certified Registered Nurse Anesthetist

## 2016-11-18 ENCOUNTER — Encounter (HOSPITAL_COMMUNITY): Admission: EM | Disposition: A | Discharge status: Rehabilitation Facility | Payer: Self-pay | Source: Home / Self Care

## 2016-11-18 ENCOUNTER — Encounter (HOSPITAL_COMMUNITY): Payer: Self-pay | Admitting: Certified Registered"

## 2016-11-18 HISTORY — PX: APPLICATION OF WOUND VAC: SHX5189

## 2016-11-18 HISTORY — PX: LAPAROTOMY: SHX154

## 2016-11-18 HISTORY — PX: BOWEL RESECTION: SHX1257

## 2016-11-18 LAB — BASIC METABOLIC PANEL
ANION GAP: 9 (ref 5–15)
Anion gap: 9 (ref 5–15)
BUN: 15 mg/dL (ref 6–20)
BUN: 17 mg/dL (ref 6–20)
CALCIUM: 7.7 mg/dL — AB (ref 8.9–10.3)
CHLORIDE: 105 mmol/L (ref 101–111)
CO2: 25 mmol/L (ref 22–32)
CO2: 22 mmol/L (ref 22–32)
CREATININE: 0.75 mg/dL (ref 0.61–1.24)
Calcium: 8 mg/dL — ABNORMAL LOW (ref 8.9–10.3)
Chloride: 108 mmol/L (ref 101–111)
Creatinine, Ser: 0.74 mg/dL (ref 0.61–1.24)
GFR calc Af Amer: >60 mL/min (ref >60)
GFR calc Af Amer: >60 mL/min (ref >60)
GFR calc non Af Amer: >60 mL/min (ref >60)
GLUCOSE: 113 mg/dL — AB (ref 65–99)
Glucose, Bld: 102 mg/dL — ABNORMAL HIGH (ref 65–99)
Potassium: 4.7 mmol/L (ref 3.5–5.1)
Potassium: 5.2 mmol/L — ABNORMAL HIGH (ref 3.5–5.1)
Sodium: 139 mmol/L (ref 135–145)
Sodium: 139 mmol/L (ref 135–145)

## 2016-11-18 LAB — POCT I-STAT 3, ART BLOOD GAS (G3+)
ACID-BASE DEFICIT: 2 mmol/L (ref 0.0–2.0)
BICARBONATE: 23.1 mmol/L (ref 20.0–28.0)
O2 SAT: 94 %
TCO2: 24 mmol/L (ref 22–32)
pCO2 arterial: 41.6 mmHg (ref 32.0–48.0)
pH, Arterial: 7.353 (ref 7.350–7.450)
pO2, Arterial: 74 mmHg — ABNORMAL LOW (ref 83.0–108.0)

## 2016-11-18 LAB — CULTURE, BLOOD (ROUTINE X 2)
CULTURE: NO GROWTH
Culture: NO GROWTH
Special Requests: ADEQUATE
Special Requests: ADEQUATE

## 2016-11-18 LAB — CBC WITH DIFFERENTIAL/PLATELET
BASOS ABS: 0 10*3/uL (ref 0.0–0.1)
Basophils Absolute: 0 10*3/uL (ref 0.0–0.1)
Basophils Relative: 1 %
Basophils Relative: 1 %
EOS ABS: 0 10*3/uL (ref 0.0–0.7)
Eosinophils Absolute: 0.1 10*3/uL (ref 0.0–0.7)
Eosinophils Relative: 1 %
Eosinophils Relative: 1 %
HCT: 34.5 % — ABNORMAL LOW (ref 39.0–52.0)
HEMATOCRIT: 28.2 % — AB (ref 39.0–52.0)
HEMOGLOBIN: 9 g/dL — AB (ref 13.0–17.0)
HEMOGLOBIN: 11.3 g/dL — AB (ref 13.0–17.0)
LYMPHS ABS: 0.8 10*3/uL (ref 0.7–4.0)
LYMPHS PCT: 23 %
Lymphocytes Relative: 13 %
Lymphs Abs: 1 10*3/uL (ref 0.7–4.0)
MCH: 30.5 pg (ref 26.0–34.0)
MCH: 31.2 pg (ref 26.0–34.0)
MCHC: 31.9 g/dL (ref 30.0–36.0)
MCHC: 32.8 g/dL (ref 30.0–36.0)
MCV: 95.6 fL (ref 78.0–100.0)
MCV: 95.3 fL (ref 78.0–100.0)
Monocytes Absolute: 1.2 10*3/uL — ABNORMAL HIGH (ref 0.1–1.0)
Monocytes Absolute: 0.5 10*3/uL (ref 0.1–1.0)
Monocytes Relative: 20 %
Monocytes Relative: 10 %
NEUTROS ABS: 4.2 10*3/uL (ref 1.7–7.7)
NEUTROS PCT: 65 %
Neutro Abs: 3 10*3/uL (ref 1.7–7.7)
Neutrophils Relative %: 65 %
Platelets: 436 10*3/uL — ABNORMAL HIGH (ref 150–400)
Platelets: 566 10*3/uL — ABNORMAL HIGH (ref 150–400)
RBC: 2.95 MIL/uL — ABNORMAL LOW (ref 4.22–5.81)
RBC: 3.62 MIL/uL — AB (ref 4.22–5.81)
RDW: 15.1 % (ref 11.5–15.5)
RDW: 15.3 % (ref 11.5–15.5)
WBC MORPHOLOGY: INCREASED
WBC: 6.3 10*3/uL (ref 4.0–10.5)
WBC: 4.5 10*3/uL (ref 4.0–10.5)

## 2016-11-18 LAB — GLUCOSE, CAPILLARY
GLUCOSE-CAPILLARY: 96 mg/dL (ref 65–99)
GLUCOSE-CAPILLARY: 97 mg/dL (ref 65–99)
Glucose-Capillary: 91 mg/dL (ref 65–99)
Glucose-Capillary: 92 mg/dL (ref 65–99)
Glucose-Capillary: 93 mg/dL (ref 65–99)
Glucose-Capillary: 92 mg/dL (ref 65–99)

## 2016-11-18 LAB — LACTIC ACID, PLASMA: Lactic Acid, Venous: 0.8 mmol/L (ref 0.5–1.9)

## 2016-11-18 SURGERY — LAPAROTOMY, EXPLORATORY
Anesthesia: General | Site: Abdomen

## 2016-11-18 MED ORDER — FENTANYL CITRATE (PF) 250 MCG/5ML IJ SOLN
INTRAMUSCULAR | Status: AC
  Filled 2016-11-18: qty 5

## 2016-11-18 MED ORDER — SODIUM CHLORIDE 0.9 % IV SOLN
500.0000 mL | Freq: Once | INTRAVENOUS | Status: AC
  Administered 2016-11-18: 1000 mL via INTRAVENOUS

## 2016-11-18 MED ORDER — LIDOCAINE 2% (20 MG/ML) 5 ML SYRINGE
INTRAMUSCULAR | Status: AC
  Filled 2016-11-18: qty 5

## 2016-11-18 MED ORDER — ROCURONIUM BROMIDE 10 MG/ML (PF) SYRINGE
PREFILLED_SYRINGE | INTRAVENOUS | Status: AC
  Filled 2016-11-18: qty 5

## 2016-11-18 MED ORDER — 0.9 % SODIUM CHLORIDE (POUR BTL) OPTIME
TOPICAL | Status: DC | PRN
  Administered 2016-11-18: 2000 mL
  Administered 2016-11-18: 3000 mL

## 2016-11-18 MED ORDER — VECURONIUM BROMIDE 10 MG IV SOLR
INTRAVENOUS | Status: DC | PRN
  Administered 2016-11-18: 5 mg via INTRAVENOUS

## 2016-11-18 MED ORDER — PHENYLEPHRINE 40 MCG/ML (10ML) SYRINGE FOR IV PUSH (FOR BLOOD PRESSURE SUPPORT)
PREFILLED_SYRINGE | INTRAVENOUS | Status: DC | PRN
  Administered 2016-11-18: 120 ug via INTRAVENOUS

## 2016-11-18 MED ORDER — MIDAZOLAM HCL 2 MG/2ML IJ SOLN
2.0000 mg | INTRAMUSCULAR | Status: DC | PRN
  Administered 2016-11-19 – 2016-11-23 (×7): 2 mg via INTRAVENOUS
  Filled 2016-11-18 (×3): qty 2

## 2016-11-18 MED ORDER — LACTATED RINGERS IV SOLN
INTRAVENOUS | Status: DC | PRN
  Administered 2016-11-18: 16:00:00 via INTRAVENOUS

## 2016-11-18 MED ORDER — DEXMEDETOMIDINE HCL IN NACL 200 MCG/50ML IV SOLN
0.0000 ug/kg/h | INTRAVENOUS | Status: DC
Start: 2016-11-18 — End: 2016-11-19
  Administered 2016-11-18 (×2): 1 ug/kg/h via INTRAVENOUS
  Administered 2016-11-18: 0.4 ug/kg/h via INTRAVENOUS
  Administered 2016-11-18: 1 ug/kg/h via INTRAVENOUS
  Administered 2016-11-19 (×5): 0.8 ug/kg/h via INTRAVENOUS
  Administered 2016-11-19: 1.2 ug/kg/h via INTRAVENOUS
  Administered 2016-11-19: 0.7 ug/kg/h via INTRAVENOUS
  Administered 2016-11-19: 1.2 ug/kg/h via INTRAVENOUS
  Administered 2016-11-19: 1.1 ug/kg/h via INTRAVENOUS
  Administered 2016-11-19: 0.8 ug/kg/h via INTRAVENOUS
  Administered 2016-11-19 (×2): 1 ug/kg/h via INTRAVENOUS
  Filled 2016-11-18 (×16): qty 50

## 2016-11-18 MED ORDER — IOPAMIDOL (ISOVUE-370) INJECTION 76%
INTRAVENOUS | Status: AC
  Administered 2016-11-18: 100 mL
  Filled 2016-11-18: qty 100

## 2016-11-18 MED ORDER — FENTANYL CITRATE (PF) 100 MCG/2ML IJ SOLN
INTRAMUSCULAR | Status: DC | PRN
  Administered 2016-11-18: 200 ug via INTRAVENOUS

## 2016-11-18 MED ORDER — ALBUMIN HUMAN 5 % IV SOLN
50.0000 g | Freq: Once | INTRAVENOUS | Status: AC
Start: 2016-11-18 — End: 2016-11-18
  Administered 2016-11-18: 50 g via INTRAVENOUS
  Filled 2016-11-18: qty 1000

## 2016-11-18 MED ORDER — MIDAZOLAM HCL 5 MG/5ML IJ SOLN
INTRAMUSCULAR | Status: DC | PRN
  Administered 2016-11-18: 2 mg via INTRAVENOUS

## 2016-11-18 MED ORDER — PROPOFOL 10 MG/ML IV BOLUS
INTRAVENOUS | Status: AC
  Filled 2016-11-18: qty 20

## 2016-11-18 MED ORDER — MIDAZOLAM HCL 2 MG/2ML IJ SOLN
INTRAMUSCULAR | Status: AC
  Filled 2016-11-18: qty 2

## 2016-11-18 MED ORDER — ROCURONIUM BROMIDE 10 MG/ML (PF) SYRINGE
PREFILLED_SYRINGE | INTRAVENOUS | Status: DC | PRN
  Administered 2016-11-18: 40 mg via INTRAVENOUS
  Administered 2016-11-18: 60 mg via INTRAVENOUS

## 2016-11-18 MED ORDER — MIDAZOLAM HCL 2 MG/2ML IJ SOLN
4.0000 mg | INTRAMUSCULAR | Status: AC | PRN
  Administered 2016-11-18 – 2016-11-23 (×2): 4 mg via INTRAVENOUS
  Administered 2016-11-23: 8 mg via INTRAVENOUS

## 2016-11-18 SURGICAL SUPPLY — 47 items
BENZOIN TINCTURE PRP APPL 2/3 (GAUZE/BANDAGES/DRESSINGS) ×6 IMPLANT
BLADE CLIPPER SURG (BLADE) IMPLANT
CANISTER SUCT 3000ML PPV (MISCELLANEOUS) ×3 IMPLANT
CHLORAPREP W/TINT 26ML (MISCELLANEOUS) ×3 IMPLANT
COVER SURGICAL LIGHT HANDLE (MISCELLANEOUS) ×3 IMPLANT
DRAPE LAPAROSCOPIC ABDOMINAL (DRAPES) ×3 IMPLANT
DRAPE LAPAROTOMY T 102X78X121 (DRAPES) ×3 IMPLANT
DRAPE WARM FLUID 44X44 (DRAPE) ×3 IMPLANT
DRSG OPSITE POSTOP 4X10 (GAUZE/BANDAGES/DRESSINGS) IMPLANT
DRSG OPSITE POSTOP 4X8 (GAUZE/BANDAGES/DRESSINGS) IMPLANT
ELECT BLADE 6.5 EXT (BLADE) IMPLANT
ELECT CAUTERY BLADE 6.4 (BLADE) ×3 IMPLANT
ELECT REM PT RETURN 9FT ADLT (ELECTROSURGICAL) ×3
ELECTRODE REM PT RTRN 9FT ADLT (ELECTROSURGICAL) ×1 IMPLANT
GLOVE BIOGEL PI IND STRL 8 (GLOVE) ×3 IMPLANT
GLOVE BIOGEL PI INDICATOR 8 (GLOVE) ×6
GLOVE ECLIPSE 7.5 STRL STRAW (GLOVE) ×3 IMPLANT
GLOVE SURG SS PI 7.5 STRL IVOR (GLOVE) ×3 IMPLANT
GOWN STRL REUS W/ TWL LRG LVL3 (GOWN DISPOSABLE) ×2 IMPLANT
GOWN STRL REUS W/TWL LRG LVL3 (GOWN DISPOSABLE) ×4
KIT BASIN OR (CUSTOM PROCEDURE TRAY) ×3 IMPLANT
KIT ROOM TURNOVER OR (KITS) ×3 IMPLANT
LIGASURE IMPACT 36 18CM CVD LR (INSTRUMENTS) IMPLANT
LIGASURE SEALERDIVIDER 5MM (INSTRUMENTS) ×3 IMPLANT
NS IRRIG 1000ML POUR BTL (IV SOLUTION) ×6 IMPLANT
PACK GENERAL/GYN (CUSTOM PROCEDURE TRAY) ×3 IMPLANT
PAD ARMBOARD 7.5X6 YLW CONV (MISCELLANEOUS) ×3 IMPLANT
PAD NEG PRESSURE SENSATRAC (MISCELLANEOUS) ×3 IMPLANT
RELOAD LINEAR CUT PROX 55 BLUE (ENDOMECHANICALS) ×6 IMPLANT
RELOAD PROXIMATE 75MM BLUE (ENDOMECHANICALS) ×6 IMPLANT
RELOAD STAPLER LINEAR PROX 30 (STAPLE) ×1 IMPLANT
SEPRAFILM PROCEDURAL PACK 3X5 (MISCELLANEOUS) IMPLANT
SPECIMEN JAR LARGE (MISCELLANEOUS) IMPLANT
SPONGE LAP 18X18 X RAY DECT (DISPOSABLE) IMPLANT
STAPLER PROXIMATE 75MM BLUE (STAPLE) ×3 IMPLANT
STAPLER RELOAD LINEAR PROX 30 (STAPLE) ×3
STAPLER VISISTAT 35W (STAPLE) IMPLANT
SUCTION POOLE TIP (SUCTIONS) ×3 IMPLANT
SUT NOVA 1 T20/GS 25DT (SUTURE) IMPLANT
SUT PDS AB 1 TP1 96 (SUTURE) ×6 IMPLANT
SUT SILK 2 0 SH CR/8 (SUTURE) ×3 IMPLANT
SUT SILK 2 0 TIES 10X30 (SUTURE) ×3 IMPLANT
SUT SILK 3 0 SH CR/8 (SUTURE) ×3 IMPLANT
SUT SILK 3 0 TIES 10X30 (SUTURE) ×3 IMPLANT
TOWEL OR 17X26 10 PK STRL BLUE (TOWEL DISPOSABLE) ×3 IMPLANT
TRAY FOLEY W/METER SILVER 16FR (SET/KITS/TRAYS/PACK) IMPLANT
YANKAUER SUCT BULB TIP NO VENT (SUCTIONS) ×3 IMPLANT

## 2016-11-18 NOTE — Progress Notes (Signed)
Follow up - Trauma and Critical Care  Patient Details:    Chase Ayala is an 26 y.o. male.  Lines/tubes : NG/OG Tube Nasogastric 18 Fr. Right nare Xray (Active)  External Length of Tube (cm) - (if applicable) 65 cm 11/18/2016  8:00 AM  Site Assessment Clean;Dry;Intact 11/18/2016  8:00 AM  Ongoing Placement Verification No change in cm markings or external length of tube from initial placement;No change in respiratory status;No acute changes, not attributed to clinical condition;Xray 11/18/2016  8:00 AM  Status Suction-low intermittent 11/18/2016  8:00 AM  Amount of suction 76 mmHg 11/17/2016  8:30 AM  Drainage Appearance Brown;Clear 11/18/2016  8:00 AM  Output (mL) 225 mL 11/18/2016  6:00 AM     Urethral Catheter Carilyn Goodpasture, RN Double-lumen 16 Fr. (Active)  Indication for Insertion or Continuance of Catheter Acute urinary retention 11/18/2016  7:55 AM  Site Assessment Clean;Intact 11/18/2016  8:00 AM  Catheter Maintenance Bag below level of bladder;Catheter secured;Drainage bag/tubing not touching floor;Insertion date on drainage bag;No dependent loops;Seal intact 11/18/2016  8:00 AM  Collection Container Standard drainage bag 11/18/2016  8:00 AM  Securement Method Securing device (Describe) 11/18/2016  8:00 AM  Urinary Catheter Interventions Unclamped 11/18/2016  8:00 AM  Output (mL) 100 mL 11/18/2016 10:00 AM    Microbiology/Sepsis markers: Results for orders placed or performed during the hospital encounter of 11/05/16  Culture, Urine     Status: None   Collection Time: 11/12/16 10:34 AM  Result Value Ref Range Status   Specimen Description URINE, CATHETERIZED  Final   Special Requests NONE  Final   Culture NO GROWTH  Final   Report Status 11/13/2016 FINAL  Final  Culture, respiratory (NON-Expectorated)     Status: Abnormal   Collection Time: 11/12/16 11:11 AM  Result Value Ref Range Status   Specimen Description TRACHEAL ASPIRATE  Final   Special Requests NONE  Final   Gram Stain   Final     RARE SQUAMOUS EPITHELIAL CELLS PRESENT FEW WBC PRESENT,BOTH PMN AND MONONUCLEAR RARE GRAM NEGATIVE RODS RARE GRAM POSITIVE COCCI RARE GRAM NEGATIVE COCCI    Culture MULTIPLE ORGANISMS PRESENT, NONE PREDOMINANT (A)  Final   Report Status 11/14/2016 FINAL  Final  Culture, blood (routine x 2)     Status: None   Collection Time: 11/12/16 11:18 AM  Result Value Ref Range Status   Specimen Description BLOOD LEFT HAND  Final   Special Requests IN PEDIATRIC BOTTLE Blood Culture adequate volume  Final   Culture NO GROWTH 5 DAYS  Final   Report Status 11/17/2016 FINAL  Final  Culture, blood (routine x 2)     Status: None   Collection Time: 11/12/16 11:22 AM  Result Value Ref Range Status   Specimen Description BLOOD LEFT ANTECUBITAL  Final   Special Requests IN PEDIATRIC BOTTLE Blood Culture adequate volume  Final   Culture NO GROWTH 5 DAYS  Final   Report Status 11/17/2016 FINAL  Final  Culture, respiratory (NON-Expectorated)     Status: Abnormal   Collection Time: 11/13/16 12:10 PM  Result Value Ref Range Status   Specimen Description TRACHEAL ASPIRATE  Final   Special Requests Normal  Final   Gram Stain   Final    RARE WBC PRESENT,BOTH PMN AND MONONUCLEAR FEW GRAM VARIABLE ROD RARE GRAM POSITIVE COCCI IN PAIRS    Culture MULTIPLE ORGANISMS PRESENT, NONE PREDOMINANT (A)  Final   Report Status 11/15/2016 FINAL  Final  Culture, blood (Routine X 2) w  Reflex to ID Panel     Status: None   Collection Time: 11/13/16 12:40 PM  Result Value Ref Range Status   Specimen Description BLOOD RIGHT ANTECUBITAL  Final   Special Requests IN PEDIATRIC BOTTLE Blood Culture adequate volume  Final   Culture NO GROWTH 5 DAYS  Final   Report Status 11/18/2016 FINAL  Final  Culture, blood (Routine X 2) w Reflex to ID Panel     Status: None   Collection Time: 11/13/16 12:40 PM  Result Value Ref Range Status   Specimen Description BLOOD LEFT ANTECUBITAL  Final   Special Requests IN PEDIATRIC BOTTLE Blood  Culture adequate volume  Final   Culture NO GROWTH 5 DAYS  Final   Report Status 11/18/2016 FINAL  Final  Culture, Urine     Status: None   Collection Time: 11/13/16  1:56 PM  Result Value Ref Range Status   Specimen Description URINE, CATHETERIZED  Final   Special Requests Normal  Final   Culture NO GROWTH  Final   Report Status 11/14/2016 FINAL  Final  MRSA PCR Screening     Status: Abnormal   Collection Time: 11/14/16  1:43 PM  Result Value Ref Range Status   MRSA by PCR POSITIVE (A) NEGATIVE Final    Comment:        The GeneXpert MRSA Assay (FDA approved for NASAL specimens only), is one component of a comprehensive MRSA colonization surveillance program. It is not intended to diagnose MRSA infection nor to guide or monitor treatment for MRSA infections. RESULT CALLED TO, READ BACK BY AND VERIFIED WITH: M. BERGMAN [id number] 09.01.2018 N. MORRIS     Anti-infectives:  Anti-infectives    Start     Dose/Rate Route Frequency Ordered Stop   11/17/16 0000  vancomycin (VANCOCIN) 1,500 mg in sodium chloride 0.9 % 500 mL IVPB     1,500 mg 250 mL/hr over 120 Minutes Intravenous Every 8 hours 11/16/16 1700     11/15/16 1600  vancomycin (VANCOCIN) IVPB 1000 mg/200 mL premix  Status:  Discontinued     1,000 mg 200 mL/hr over 60 Minutes Intravenous Every 8 hours 11/15/16 1332 11/16/16 1700   11/15/16 0130  vancomycin (VANCOCIN) IVPB 1000 mg/200 mL premix  Status:  Discontinued     1,000 mg 200 mL/hr over 60 Minutes Intravenous Every 8 hours 11/14/16 1628 11/15/16 1332   11/14/16 1730  vancomycin (VANCOCIN) 2,000 mg in sodium chloride 0.9 % 500 mL IVPB     2,000 mg 250 mL/hr over 120 Minutes Intravenous  Once 11/14/16 1628 11/14/16 2042   11/13/16 1400  piperacillin-tazobactam (ZOSYN) IVPB 3.375 g  Status:  Discontinued     3.375 g 100 mL/hr over 30 Minutes Intravenous Every 8 hours 11/13/16 1115 11/13/16 1121   11/12/16 1130  piperacillin-tazobactam (ZOSYN) IVPB 3.375 g     3.375  g 12.5 mL/hr over 240 Minutes Intravenous Every 8 hours 11/12/16 1038     11/05/16 0600  ceFAZolin (ANCEF) IVPB 2g/100 mL premix     2 g 200 mL/hr over 30 Minutes Intravenous  Once 11/05/16 0559 11/05/16 4098      Best Practice/Protocols:  VTE Prophylaxis: Mechanical GI Prophylaxis: Proton Pump Inhibitor Continous Sedation  Consults: Treatment Team:  Donalee Citrin, MD Myrene Galas, MD Serena Colonel, MD    Events:  Subjective:    Overnight Issues: In spite of sedation and NGT decompression, the patient's abdomen continues to be very distended and tense.  Objective:  Vital signs for  last 24 hours: Temp:  [98.4 F (36.9 C)-102.9 F (39.4 C)] 99.8 F (37.7 C) (09/05 0800) Pulse Rate:  [43-158] 141 (09/05 1100) Resp:  [18-37] 24 (09/05 1100) BP: (101-177)/(65-145) 156/96 (09/05 1100) SpO2:  [86 %-100 %] 97 % (09/05 1100) FiO2 (%):  [30 %-50 %] 40 % (09/05 0815) Weight:  [114 kg (251 lb 5.2 oz)] 114 kg (251 lb 5.2 oz) (09/05 0500)  Hemodynamic parameters for last 24 hours:    Intake/Output from previous day: 09/04 0701 - 09/05 0700 In: 5860.4 [I.V.:3610.4; IV Piggyback:2250] Out: 2725 [Urine:1825; Emesis/NG output:900]  Intake/Output this shift: Total I/O In: 1084 [I.V.:584; IV Piggyback:500] Out: 100 [Urine:100]  Vent settings for last 24 hours: Vent Mode: PRVC FiO2 (%):  [30 %-50 %] 40 % Set Rate:  [16 bmp] 16 bmp Vt Set:  [558 mL-560 mL] 558 mL PEEP:  [5 cmH20] 5 cmH20 Plateau Pressure:  [31 cmH20-38 cmH20] 38 cmH20  Physical Exam:  General: no respiratory distress and Seems to be uncomforatable Neuro: nonfocal exam, RASS 0 and RASS -1 Resp: diminished breath sounds bibasilar and Loss of volume bilaterally.  Right effusion. CVS: Persistent sinus tachycardia GI: distended, hypoactive BS and tense Extremities: no edema, no erythema, pulses WNL and edema 2+  Results for orders placed or performed during the hospital encounter of 11/05/16 (from the past 24  hour(s))  Glucose, capillary     Status: Abnormal   Collection Time: 11/17/16 12:05 PM  Result Value Ref Range   Glucose-Capillary 103 (H) 65 - 99 mg/dL  Glucose, capillary     Status: Abnormal   Collection Time: 11/17/16  4:24 PM  Result Value Ref Range   Glucose-Capillary 105 (H) 65 - 99 mg/dL   Comment 1 Notify RN    Comment 2 Document in Chart   Glucose, capillary     Status: Abnormal   Collection Time: 11/17/16  8:13 PM  Result Value Ref Range   Glucose-Capillary 105 (H) 65 - 99 mg/dL  Glucose, capillary     Status: None   Collection Time: 11/17/16 11:48 PM  Result Value Ref Range   Glucose-Capillary 92 65 - 99 mg/dL  Glucose, capillary     Status: None   Collection Time: 11/18/16  4:25 AM  Result Value Ref Range   Glucose-Capillary 91 65 - 99 mg/dL  Glucose, capillary     Status: None   Collection Time: 11/18/16  9:06 AM  Result Value Ref Range   Glucose-Capillary 92 65 - 99 mg/dL  CBC with Differential/Platelet     Status: Abnormal   Collection Time: 11/18/16 10:16 AM  Result Value Ref Range   WBC 6.3 4.0 - 10.5 K/uL   RBC 2.95 (L) 4.22 - 5.81 MIL/uL   Hemoglobin 9.0 (L) 13.0 - 17.0 g/dL   HCT 16.128.2 (L) 09.639.0 - 04.552.0 %   MCV 95.6 78.0 - 100.0 fL   MCH 30.5 26.0 - 34.0 pg   MCHC 31.9 30.0 - 36.0 g/dL   RDW 40.915.1 81.111.5 - 91.415.5 %   Platelets 436 (H) 150 - 400 K/uL   Neutrophils Relative % 65 %   Neutro Abs 4.2 1.7 - 7.7 K/uL   Lymphocytes Relative 13 %   Lymphs Abs 0.8 0.7 - 4.0 K/uL   Monocytes Relative 20 %   Monocytes Absolute 1.2 (H) 0.1 - 1.0 K/uL   Eosinophils Relative 1 %   Eosinophils Absolute 0.1 0.0 - 0.7 K/uL   Basophils Relative 1 %   Basophils Absolute  0.0 0.0 - 0.1 K/uL  Basic metabolic panel     Status: Abnormal   Collection Time: 11/18/16 10:16 AM  Result Value Ref Range   Sodium 139 135 - 145 mmol/L   Potassium 4.7 3.5 - 5.1 mmol/L   Chloride 105 101 - 111 mmol/L   CO2 25 22 - 32 mmol/L   Glucose, Bld 102 (H) 65 - 99 mg/dL   BUN 15 6 - 20 mg/dL    Creatinine, Ser 8.29 0.61 - 1.24 mg/dL   Calcium 8.0 (L) 8.9 - 10.3 mg/dL   GFR calc non Af Amer >60 >60 mL/min   GFR calc Af Amer >60 >60 mL/min   Anion gap 9 5 - 15     Assessment/Plan:   NEURO  Altered Mental Status:  delirium, pain and sedation   Plan: Medications as needed to maintain adequate ventilation  PULM  Atelectasis/collapse (Bilateral bases)   Plan: Not weanable  CARDIO  Sinus Tachycardia   Plan: Not really responsive to B-blocker  RENAL  Urine output and renal funciton are good.   Plan: CPM  GI  Disteneded and abdnormal.  Feels like abdominal hypertension.   Plan: CT abdomen and pelvis, measure abdominal compartment pressures  ID  Pneumonia (hospital acquired (not ventilator-associated) multiple organisms have grown out)   Plan: CPM  HEME  Anemia acute blood loss anemia)   Plan: No transfusion for now.  ENDO No specific issues   Plan: CPM  Global Issues  Abdomen appears to clearly be a source of problem although his initial CT of the abdomen did not show anything but an adrenal hemorrhage.  Will repeat CT of the abdomen and measure abdominal compartment pressure    LOS: 13 days   Additional comments:I reviewed the patient's new clinical lab test results. cbc/bmet\, I reviewed the patients new imaging test results. cxr and I have discussed and reviewed with family members patient's Mother.  Critical Care Total Time*: 45 Minutes  Diandre Merica 11/18/2016  *Care during the described time interval was provided by me and/or other providers on the critical care team.  I have reviewed this patient's available data, including medical history, events of note, physical examination and test results as part of my evaluation.

## 2016-11-18 NOTE — Progress Notes (Signed)
SLP Cancellation Note  Patient Details Name: Chase Ayala MRN: [id number] DOB: [birth year]   Cancelled treatment:       Reason Eval/Treat Not Completed: Medical issues which prohibited therapy; pt just returned from stat CT due to abdominal distension. Sedated. Will follow.    Blenda MountsCouture, Jshawn Hurta Laurice 11/18/2016, 2:42 PM
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
## 2016-11-18 NOTE — Progress Notes (Addendum)
Dr Janee Mornhompson paged regarding patients trach site. Coffee ground secretions coming from lower part of trach. Janina Mayorach site also appears worse than last week. Copious secretions and abdominal distension worrisome for GI obstruction. KUB requested but MD feels unrelated. Will continue to monitor patient and pass off information to oncoming RN.

## 2016-11-18 NOTE — Op Note (Signed)
OPERATIVE REPORT  DATE OF OPERATION:  11/18/2016  PATIENT:  Chase Ayala  26 y.o. male  PRE-OPERATIVE DIAGNOSIS:  Abdominal compartment syndrome  POST-OPERATIVE DIAGNOSIS:  Abdominal compartment syndrome, ischemic small bowel perforation x 1 with enteric ascites  INDICATION(S) FOR OPERATION:  Large amount of enteric ascites upon entering the abdomen from small bowel perforation in ischemic ileal loop  FINDINGS:  Ischemic segment of ileum about 4 feet with ischemic perforation of the mid ileum, 4 + liters of enteric ascites, 3500 cc output from NGT.  Ischemic segment left in place with distal SB in discontinuity.  PROCEDURE:  Procedure(s): EXPLORATORY LAPAROTOMY APPLICATION OF WOUND VAC SMALL BOWEL RESECTION  SURGEON:  Surgeon(s): Jimmye NormanWyatt, Pauleen Goleman, MD  ASSISTANT: None  ANESTHESIA:   general  COMPLICATIONS:  None  EBL: <50 ml  BLOOD ADMINISTERED: none  DRAINS: Nasogastric Tube, Urinary Catheter (Foley) and Negative pressure wound dressing   SPECIMEN:  Source of Specimen:  resected small bowel.  Micro of ascites.  COUNTS CORRECT:  YES  PROCEDURE DETAILS: The patient was taken to the operating room and placed on the table in supine position. He had a previous tracheostomy and was on the ventilator chronically because of his previous trauma. His abdomen was prepped and draped in usual sterile manner. He was brought to the operating room because of abdominal compartment syndrome and sepsis.  A proper timeout was performed identifying the patient and procedure to be performed. A midline incision was made using a #10 blade from above the umbilicus down below the umbilicus nearly to the pubis. It was taken down to and through the midline fascia carefully using electrocautery. Upon entering the peritoneal cavity a large amount of enteric looking ascites was aspirated immediately. We opened the incision distally and proximally and in the mid to distal ileum there was noted to be a single  anti-mesenteric perforation of the small bowel in an ischemic loop of small bowel. A stapling device was used to come across the anti-mesenteric border of this area prior to resecting a foot and half of the small bowel in that area and leaving it in discontinuity. We drained approximately 4 L of fluid from the peritoneal cavity which we aspirated.  We ran the small bowel from the ligament of Treitz down to the terminal ileum and as we got further distal small bowel started looking more more ischemic. We resected about a foot and a half of this a left in discontinuity but attached so that there could be easy approximation in the future; however, there were other loops that are at risk however did not want to make the determination to resect them at this time.  We irrigated the peritoneal cavity with a large amount of saline solution. No drains were left in place. Should be noted that his peak airway pressures dropped from 38-39 down to 26-28 on the ventilator. His abdomen was markedly softer at the conclusion of the case.  A negative pressure wound dressing was left with the open abdomen and was placed at the -125 mmHg pressure. All needle counts, sponge counts, and instrument counts were correct. Should be noted that a total of 3-1/2 L of enteric contents were aspirated from the patient's stomach as we milked the small bowel contents into the stomach for aspiration. 4 L of enteric ascites was removed from the perineal cavity itself. Cultures are taken of the peritoneal fluid.  PATIENT DISPOSITION:  Taken directly back to the ICU in critical but stable condition.   Chase Ayala  Chase Ayala 9/5/20185:30 PM

## 2016-11-18 NOTE — Progress Notes (Signed)
CT scan demonstrates massively dilated small bowel with possible pneumatosis.  He measured intra-abdominal pressure is 34mmHg. He has abdominal compartment syndrome and needs to go to the OR for a decompressive laparotomy.  I have explained this to the mother who understands.  Marta LamasJames O. Gae BonWyatt, III, MD, FACS [phone] Trauma Surgeon

## 2016-11-18 NOTE — Transfer of Care (Signed)
Immediate Anesthesia Transfer of Care Note  Patient: Chase Ayala  Procedure(s) Performed: Procedure(s): EXPLORATORY LAPAROTOMY (N/A) APPLICATION OF WOUND VAC (N/A) SMALL BOWEL RESECTION (N/A)  Patient Location: NICU  Anesthesia Type:General  Level of Consciousness: sedated and unresponsive  Airway & Oxygen Therapy: Patient remains intubated per anesthesia plan  Post-op Assessment: Report given to RN and Post -op Vital signs reviewed and stable  Post vital signs: Reviewed and stable  Last Vitals:  Vitals:   11/18/16 1500 11/18/16 1530  BP: 130/85 (!) 160/92  Pulse: (!) 129 (!) 143  Resp: 19 (!) 29  Temp: 37.1 C   SpO2: 97% 96%    Last Pain:  Vitals:   11/18/16 1500  TempSrc: Rectal  PainSc:          Complications: No apparent anesthesia complications

## 2016-11-18 NOTE — Progress Notes (Signed)
Plan for emergency surgery. RT aware. Family at bedside.  CHG bath done. Patient already being treated for positive MRSA pcr. Zosyn infusing now- MD aware. Will continue to monitor and provide emotional support to patient/family.

## 2016-11-18 NOTE — Progress Notes (Signed)
25 cc versed drip remainder wasted in sink. Redmond SchoolAshley E. RN as witness.

## 2016-11-18 NOTE — Progress Notes (Signed)
PT Cancellation Note  Patient Details Name: Chase Ayala MRN: [id number] DOB: 11/12/1990   Cancelled Treatment:    Reason Eval/Treat Not Completed: Medical issues which prohibited therapy.  Pt not medically ready today for EOB activity.  Suspected increase in abdominal pressure and sitting would not be helpful for that.  We will hold for now and follow along.  Thanks,    Rollene Rotundaebecca B. Chasady Longwell, PT, DPT [phone]   11/18/2016, 2:33 PM

## 2016-11-18 NOTE — Progress Notes (Signed)
RT note: Patient transported to and from CT on vent. Patients vitals stable throughout trip and currently. Patient is resting comfortably, RT will continue to monitor.

## 2016-11-18 NOTE — Progress Notes (Signed)
Derrell Lollingamirez paged d/t patient HR in 150's-160's. Patient febrile at 103 rectal temperature. On cooling blanket and rectal tylenol given. Dr Derrell Lollingamirez feels HR is related to fever and did not want additional metoprolol ordered, orders for one time IV tylenol given. Will place orders and continue to monitor patient.

## 2016-11-19 LAB — CBC WITH DIFFERENTIAL/PLATELET
Basophils Absolute: 0 10*3/uL (ref 0.0–0.1)
Basophils Relative: 0 %
EOS ABS: 0 10*3/uL (ref 0.0–0.7)
Eosinophils Relative: 0 %
HCT: 26.4 % — ABNORMAL LOW (ref 39.0–52.0)
HEMOGLOBIN: 9 g/dL — AB (ref 13.0–17.0)
LYMPHS ABS: 1 10*3/uL (ref 0.7–4.0)
LYMPHS PCT: 15 %
MCH: 31.9 pg (ref 26.0–34.0)
MCHC: 34.1 g/dL (ref 30.0–36.0)
MCV: 93.6 fL (ref 78.0–100.0)
Monocytes Absolute: 0.8 10*3/uL (ref 0.1–1.0)
Monocytes Relative: 11 %
NEUTROS PCT: 74 %
Neutro Abs: 5.1 10*3/uL (ref 1.7–7.7)
Platelets: 459 10*3/uL — ABNORMAL HIGH (ref 150–400)
RBC: 2.82 MIL/uL — AB (ref 4.22–5.81)
RDW: 14.6 % (ref 11.5–15.5)
WBC: 7 10*3/uL (ref 4.0–10.5)

## 2016-11-19 LAB — BLOOD GAS, ARTERIAL
ACID-BASE DEFICIT: 1 mmol/L (ref 0.0–2.0)
BICARBONATE: 23.4 mmol/L (ref 20.0–28.0)
Drawn by: 441351
FIO2: 0.4
LHR: 16 {breaths}/min
O2 SAT: 88.6 %
PATIENT TEMPERATURE: 99.9
PEEP/CPAP: 5 cmH2O
PH ART: 7.373 (ref 7.350–7.450)
PO2 ART: 65.7 mmHg — AB (ref 83.0–108.0)
VT: 560 mL
pCO2 arterial: 41.5 mmHg (ref 32.0–48.0)

## 2016-11-19 LAB — BASIC METABOLIC PANEL
ANION GAP: 8 (ref 5–15)
BUN: 16 mg/dL (ref 6–20)
CHLORIDE: 109 mmol/L (ref 101–111)
CO2: 23 mmol/L (ref 22–32)
Calcium: 7.6 mg/dL — ABNORMAL LOW (ref 8.9–10.3)
Creatinine, Ser: 0.78 mg/dL (ref 0.61–1.24)
GFR calc Af Amer: >60 mL/min (ref >60)
GFR calc non Af Amer: >60 mL/min (ref >60)
Glucose, Bld: 114 mg/dL — ABNORMAL HIGH (ref 65–99)
POTASSIUM: 4.8 mmol/L (ref 3.5–5.1)
Sodium: 140 mmol/L (ref 135–145)

## 2016-11-19 LAB — GLUCOSE, CAPILLARY
GLUCOSE-CAPILLARY: 105 mg/dL — AB (ref 65–99)
Glucose-Capillary: 120 mg/dL — ABNORMAL HIGH (ref 65–99)
Glucose-Capillary: 108 mg/dL — ABNORMAL HIGH (ref 65–99)
Glucose-Capillary: 87 mg/dL (ref 65–99)

## 2016-11-19 MED ORDER — ENOXAPARIN SODIUM 40 MG/0.4ML ~~LOC~~ SOLN
40.0000 mg | Freq: Every day | SUBCUTANEOUS | Status: DC
  Administered 2016-11-19 – 2016-11-30 (×12): 40 mg via SUBCUTANEOUS
  Filled 2016-11-19 (×12): qty 0.4

## 2016-11-19 MED ORDER — SODIUM CHLORIDE 0.9 % IV SOLN
0.0000 ug/kg/h | INTRAVENOUS | Status: AC
  Administered 2016-11-19 – 2016-11-20 (×2): 1.2 ug/kg/h via INTRAVENOUS
  Administered 2016-11-20: 1 ug/kg/h via INTRAVENOUS
  Filled 2016-11-19 (×3): qty 4
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
## 2016-11-19 NOTE — Progress Notes (Signed)
OT Cancellation Note  Patient Details Name: Chase Ayala MRN: [id number] DOB: 1990/10/18   Cancelled Treatment:    Reason Eval/Treat Not Completed: Medical issues which prohibited therapy. Underwent emergency abdominal surgery 9/5. Will attempt later date when appropriate.    Seabrook HouseWARD,HILLARY  Sheronda Parran, OT/L  [phone] 11/19/2016 11/19/2016, 9:53 AM

## 2016-11-19 NOTE — Progress Notes (Signed)
PT Cancellation Note  Patient Details Name: Chase PeerCamry O Bergthold MRN: [id number] DOB: [birth year]   Cancelled Treatment:    Reason Eval/Treat Not Completed: Medical issues which prohibited therapy. Pt underwent emergency surgery for increased abdominal pressures and ended up having a bowel resection, wound vac placement.    Trauma: please let us know when he is stable enough to start to mobilize again.      Thanks,   Rollene Rotundaebecca B. Sorina Derrig, PT, DPT [phone]   11/19/2016, 9:01 AM

## 2016-11-19 NOTE — Anesthesia Postprocedure Evaluation (Signed)
Anesthesia Post Note  Patient: Chase Ayala  Procedure(s) Performed: Procedure(s) (LRB): EXPLORATORY LAPAROTOMY (N/A) APPLICATION OF WOUND VAC (N/A) SMALL BOWEL RESECTION (N/A)     Patient location during evaluation: SICU Anesthesia Type: General Level of consciousness: sedated Pain management: pain level controlled Vital Signs Assessment: post-procedure vital signs reviewed and stable Respiratory status: patient remains intubated per anesthesia plan Cardiovascular status: stable Anesthetic complications: no    Last Vitals:  Vitals:   11/19/16 0645 11/19/16 0814  BP: 108/74 130/79  Pulse: (!) 108 (!) 117  Resp: (!) 23 (!) 26  Temp:    SpO2: 100% 100%    Last Pain:  Vitals:   11/19/16 0315  TempSrc: Rectal  PainSc:                  Chase Ayala
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
## 2016-11-19 NOTE — Progress Notes (Signed)
Peripherally Inserted Central Catheter/Midline Placement  The IV Nurse has discussed with the patient and/or persons authorized to consent for the patient, the purpose of this procedure and the potential benefits and risks involved with this procedure.  The benefits include less needle sticks, lab draws from the catheter, and the patient may be discharged home with the catheter. Risks include, but not limited to, infection, bleeding, blood clot (thrombus formation), and puncture of an artery; nerve damage and irregular heartbeat and possibility to perform a PICC exchange if needed/ordered by physician.  Alternatives to this procedure were also discussed.  Bard Power PICC patient education guide, fact sheet on infection prevention and patient information card has been provided to patient /or left at bedside.    PICC/Midline Placement Documentation     Consent signed by mother at bedside  Maximino GreenlandLumban, Hildur Bayer Albarece 11/19/2016, 11:59 AM

## 2016-11-19 NOTE — Progress Notes (Signed)
SLP Cancellation Note  Patient Details Name: Chase Ayala MRN: [id number] DOB: [birth year]   Cancelled treatment:       Reason Eval/Treat Not Completed: Medical issues which prohibited therapy. Pt underwent emergency surgery for increased abdominal pressures and ended up having a bowel resection, wound vac placement. Advised by RN to hold treatment for today. Will f/u 9/7.   Ferdinand LangoLeah Narek Kniss MA, CCC-SLP [phone]    Ferdinand LangoMcCoy Krizia Flight Meryl 11/19/2016, 9:37 AM

## 2016-11-20 ENCOUNTER — Inpatient Hospital Stay (HOSPITAL_COMMUNITY): Payer: Self-pay | Admitting: Certified Registered Nurse Anesthetist

## 2016-11-20 ENCOUNTER — Encounter (HOSPITAL_COMMUNITY): Admission: EM | Disposition: A | Discharge status: Rehabilitation Facility | Payer: Self-pay | Source: Home / Self Care

## 2016-11-20 ENCOUNTER — Inpatient Hospital Stay (HOSPITAL_COMMUNITY): Payer: Self-pay

## 2016-11-20 ENCOUNTER — Encounter (HOSPITAL_COMMUNITY): Payer: Self-pay | Admitting: Certified Registered"

## 2016-11-20 HISTORY — PX: LAPAROTOMY: SHX154

## 2016-11-20 HISTORY — PX: BOWEL RESECTION: SHX1257

## 2016-11-20 HISTORY — PX: VACUUM ASSISTED CLOSURE CHANGE: SHX5227

## 2016-11-20 LAB — GLUCOSE, CAPILLARY
GLUCOSE-CAPILLARY: 88 mg/dL (ref 65–99)
GLUCOSE-CAPILLARY: 89 mg/dL (ref 65–99)
GLUCOSE-CAPILLARY: 91 mg/dL (ref 65–99)
GLUCOSE-CAPILLARY: 95 mg/dL (ref 65–99)
GLUCOSE-CAPILLARY: 99 mg/dL (ref 65–99)
Glucose-Capillary: 92 mg/dL (ref 65–99)
Glucose-Capillary: 96 mg/dL (ref 65–99)

## 2016-11-20 LAB — CBC
HCT: 17.8 % — ABNORMAL LOW (ref 39.0–52.0)
Hemoglobin: 5.9 g/dL — CL (ref 13.0–17.0)
MCH: 30.9 pg (ref 26.0–34.0)
MCHC: 33.1 g/dL (ref 30.0–36.0)
MCV: 93.2 fL (ref 78.0–100.0)
PLATELETS: 353 10*3/uL (ref 150–400)
RBC: 1.91 MIL/uL — AB (ref 4.22–5.81)
RDW: 15.2 % (ref 11.5–15.5)
WBC: 6.6 10*3/uL (ref 4.0–10.5)

## 2016-11-20 LAB — BASIC METABOLIC PANEL
Anion gap: 3 — ABNORMAL LOW (ref 5–15)
Anion gap: 8 (ref 5–15)
BUN: 12 mg/dL (ref 6–20)
BUN: 15 mg/dL (ref 6–20)
CALCIUM: 7.7 mg/dL — AB (ref 8.9–10.3)
CO2: 16 mmol/L — ABNORMAL LOW (ref 22–32)
CO2: 22 mmol/L (ref 22–32)
CREATININE: 0.54 mg/dL — AB (ref 0.61–1.24)
CREATININE: 0.82 mg/dL (ref 0.61–1.24)
Calcium: 5.5 mg/dL — CL (ref 8.9–10.3)
Chloride: 125 mmol/L — ABNORMAL HIGH (ref 101–111)
Chloride: 113 mmol/L — ABNORMAL HIGH (ref 101–111)
GFR calc Af Amer: >60 mL/min (ref >60)
GLUCOSE: 73 mg/dL (ref 65–99)
Glucose, Bld: 95 mg/dL (ref 65–99)
POTASSIUM: 7.4 mmol/L — AB (ref 3.5–5.1)
Potassium: 4.4 mmol/L (ref 3.5–5.1)
SODIUM: 144 mmol/L (ref 135–145)
SODIUM: 143 mmol/L (ref 135–145)

## 2016-11-20 LAB — CBC WITH DIFFERENTIAL/PLATELET
BASOS ABS: 0 10*3/uL (ref 0.0–0.1)
Basophils Relative: 0 %
EOS ABS: 0.1 10*3/uL (ref 0.0–0.7)
Eosinophils Relative: 1 %
HCT: 23.8 % — ABNORMAL LOW (ref 39.0–52.0)
Hemoglobin: 7.8 g/dL — ABNORMAL LOW (ref 13.0–17.0)
LYMPHS ABS: 1.7 10*3/uL (ref 0.7–4.0)
LYMPHS PCT: 17 %
MCH: 30.7 pg (ref 26.0–34.0)
MCHC: 32.8 g/dL (ref 30.0–36.0)
MCV: 93.7 fL (ref 78.0–100.0)
Monocytes Absolute: 1.5 10*3/uL — ABNORMAL HIGH (ref 0.1–1.0)
Monocytes Relative: 15 %
NEUTROS ABS: 6.7 10*3/uL (ref 1.7–7.7)
Neutrophils Relative %: 67 %
PLATELETS: 478 10*3/uL — AB (ref 150–400)
RBC: 2.54 MIL/uL — AB (ref 4.22–5.81)
RDW: 14.7 % (ref 11.5–15.5)
WBC: 10 10*3/uL (ref 4.0–10.5)

## 2016-11-20 LAB — SURGICAL PCR SCREEN
MRSA, PCR: POSITIVE — AB
STAPHYLOCOCCUS AUREUS: POSITIVE — AB

## 2016-11-20 LAB — PREPARE RBC (CROSSMATCH)

## 2016-11-20 SURGERY — LAPAROTOMY, EXPLORATORY
Anesthesia: General | Site: Abdomen

## 2016-11-20 MED ORDER — ROCURONIUM BROMIDE 10 MG/ML (PF) SYRINGE
PREFILLED_SYRINGE | INTRAVENOUS | Status: AC
  Filled 2016-11-20: qty 5

## 2016-11-20 MED ORDER — METOPROLOL TARTRATE 5 MG/5ML IV SOLN
5.0000 mg | INTRAVENOUS | Status: DC | PRN
  Administered 2016-11-20 – 2016-12-15 (×16): 5 mg via INTRAVENOUS
  Filled 2016-11-20 (×18): qty 5

## 2016-11-20 MED ORDER — FENTANYL CITRATE (PF) 250 MCG/5ML IJ SOLN
INTRAMUSCULAR | Status: AC
  Filled 2016-11-20: qty 5

## 2016-11-20 MED ORDER — SODIUM CHLORIDE 0.9 % IV SOLN
0.5000 mg/h | INTRAVENOUS | Status: DC
  Administered 2016-11-20 (×2): 10 mg/h via INTRAVENOUS
  Administered 2016-11-20: 0.5 mg/h via INTRAVENOUS
  Administered 2016-11-21 – 2016-11-23 (×13): 10 mg/h via INTRAVENOUS
  Filled 2016-11-20 (×22): qty 10

## 2016-11-20 MED ORDER — VECURONIUM BROMIDE 10 MG IV SOLR
INTRAVENOUS | Status: DC | PRN
  Administered 2016-11-20: 5 mg via INTRAVENOUS

## 2016-11-20 MED ORDER — ROCURONIUM BROMIDE 100 MG/10ML IV SOLN
INTRAVENOUS | Status: DC | PRN
  Administered 2016-11-20 (×2): 50 mg via INTRAVENOUS

## 2016-11-20 MED ORDER — 0.9 % SODIUM CHLORIDE (POUR BTL) OPTIME
TOPICAL | Status: DC | PRN
  Administered 2016-11-20 (×4): 1000 mL

## 2016-11-20 MED ORDER — PROPOFOL 10 MG/ML IV BOLUS
INTRAVENOUS | Status: AC
  Filled 2016-11-20: qty 20

## 2016-11-20 MED ORDER — VECURONIUM BROMIDE 10 MG IV SOLR
INTRAVENOUS | Status: AC
  Filled 2016-11-20: qty 10

## 2016-11-20 MED ORDER — LACTATED RINGERS IV SOLN
INTRAVENOUS | Status: DC | PRN
  Administered 2016-11-20: 08:00:00 via INTRAVENOUS

## 2016-11-20 MED ORDER — SODIUM CHLORIDE 0.9 % IV SOLN: Freq: Once | INTRAVENOUS | Status: DC

## 2016-11-20 MED ORDER — MIDAZOLAM HCL 2 MG/2ML IJ SOLN
INTRAMUSCULAR | Status: DC | PRN
  Administered 2016-11-20: 2 mg via INTRAVENOUS

## 2016-11-20 MED ORDER — SODIUM CHLORIDE 0.9 % IJ SOLN
INTRAMUSCULAR | Status: AC
  Filled 2016-11-20: qty 10

## 2016-11-20 MED ORDER — MIDAZOLAM HCL 2 MG/2ML IJ SOLN
INTRAMUSCULAR | Status: AC
  Filled 2016-11-20: qty 2

## 2016-11-20 MED ORDER — FENTANYL CITRATE (PF) 250 MCG/5ML IJ SOLN
INTRAMUSCULAR | Status: DC | PRN
  Administered 2016-11-20: 100 ug via INTRAVENOUS

## 2016-11-20 SURGICAL SUPPLY — 40 items
ABTHERA ADVANCE OPEN ABDOMEN DRESSING ×3 IMPLANT
BLADE CLIPPER SURG (BLADE) IMPLANT
CANISTER SUCT 3000ML PPV (MISCELLANEOUS) ×3 IMPLANT
CANISTER WOUND CARE 500ML ATS (WOUND CARE) ×3 IMPLANT
COVER SURGICAL LIGHT HANDLE (MISCELLANEOUS) ×3 IMPLANT
DRAPE LAPAROSCOPIC ABDOMINAL (DRAPES) ×3 IMPLANT
DRAPE WARM FLUID 44X44 (DRAPE) ×3 IMPLANT
DRSG OPSITE POSTOP 4X10 (GAUZE/BANDAGES/DRESSINGS) IMPLANT
ELECT BLADE 6.5 EXT (BLADE) ×3 IMPLANT
ELECT CAUTERY BLADE 6.4 (BLADE) ×3 IMPLANT
ELECT REM PT RETURN 9FT ADLT (ELECTROSURGICAL) ×3
ELECTRODE REM PT RTRN 9FT ADLT (ELECTROSURGICAL) ×1 IMPLANT
GLOVE BIO SURGEON STRL SZ8 (GLOVE) ×3 IMPLANT
GLOVE BIOGEL PI IND STRL 8 (GLOVE) ×1 IMPLANT
GLOVE BIOGEL PI INDICATOR 8 (GLOVE) ×2
GOWN STRL REUS W/ TWL LRG LVL3 (GOWN DISPOSABLE) ×1 IMPLANT
GOWN STRL REUS W/ TWL XL LVL3 (GOWN DISPOSABLE) ×1 IMPLANT
GOWN STRL REUS W/TWL LRG LVL3 (GOWN DISPOSABLE) ×2
GOWN STRL REUS W/TWL XL LVL3 (GOWN DISPOSABLE) ×2
KIT BASIN OR (CUSTOM PROCEDURE TRAY) ×3 IMPLANT
KIT ROOM TURNOVER OR (KITS) ×3 IMPLANT
LIGASURE IMPACT 36 18CM CVD LR (INSTRUMENTS) IMPLANT
NS IRRIG 1000ML POUR BTL (IV SOLUTION) ×12 IMPLANT
PACK GENERAL/GYN (CUSTOM PROCEDURE TRAY) ×3 IMPLANT
PAD ARMBOARD 7.5X6 YLW CONV (MISCELLANEOUS) ×3 IMPLANT
RELOAD PROXIMATE 75MM BLUE (ENDOMECHANICALS) ×3 IMPLANT
SOLUTION BETADINE 4OZ (MISCELLANEOUS) ×3 IMPLANT
SPONGE LAP 18X18 X RAY DECT (DISPOSABLE) IMPLANT
SPRAY BETADINE AEROSOL XXX (MISCELLANEOUS) ×3 IMPLANT
STAPLER PROXIMATE 75MM BLUE (STAPLE) ×3 IMPLANT
STAPLER VISISTAT 35W (STAPLE) ×3 IMPLANT
SUCTION POOLE TIP (SUCTIONS) ×3 IMPLANT
SUT PDS AB 1 TP1 96 (SUTURE) ×6 IMPLANT
SUT SILK 2 0 SH CR/8 (SUTURE) ×3 IMPLANT
SUT SILK 2 0 TIES 10X30 (SUTURE) ×3 IMPLANT
SUT SILK 3 0 SH CR/8 (SUTURE) ×3 IMPLANT
SUT SILK 3 0 TIES 10X30 (SUTURE) ×3 IMPLANT
TOWEL OR 17X26 10 PK STRL BLUE (TOWEL DISPOSABLE) ×3 IMPLANT
TRAY FOLEY W/METER SILVER 16FR (SET/KITS/TRAYS/PACK) IMPLANT
YANKAUER SUCT BULB TIP NO VENT (SUCTIONS) IMPLANT

## 2016-11-20 NOTE — Transfer of Care (Signed)
Immediate Anesthesia Transfer of Care Note  Patient: Chase Ayala  Procedure(s) Performed: Procedure(s): EXPLORATORY LAPAROTOMY (N/A) SMALL BOWEL RESECTION (N/A) ABDOMINAL VACUUM ASSISTED CLOSURE CHANGE (N/A)  Patient Location: ICU  Anesthesia Type:General  Level of Consciousness: Patient remains intubated per anesthesia plan  Airway & Oxygen Therapy: Patient remains intubated per anesthesia plan and Patient placed on Ventilator (see vital sign flow sheet for setting)  Post-op Assessment: Report given to RN  Post vital signs: Reviewed and stable  Last Vitals:  Vitals:   11/20/16 0600 11/20/16 0700  BP: 140/62 (!) 144/81  Pulse: (!) 123 (!) 59  Resp: (!) 29 (!) 34  Temp:    SpO2:      Last Pain:  Vitals:   11/20/16 0400  TempSrc: Oral  PainSc:          Complications: No apparent anesthesia complications

## 2016-11-20 NOTE — Progress Notes (Signed)
SLP Cancellation Note  Patient Details Name: Chase PeerCamry O Mantell MRN: [id number] DOB: December 11, 1990   Cancelled treatment:       Reason Eval/Treat Not Completed: Patient at procedure or test/unavailable   Alzena Gerber, Riley NearingBonnie Caroline 11/20/2016, 8:55 AM

## 2016-11-20 NOTE — Progress Notes (Signed)
PT Cancellation Note  Patient Details Name: Chase Ayala MRN: [id number] DOB: January 13, 1991   Cancelled Treatment:    Reason Eval/Treat Not Completed: Medical issues which prohibited therapy (pt taken back to OR this AM for repeat surgery and not appropriate at this time)   Denis Koppel B Jennifier Smitherman 11/20/2016, 8:23 AM  Delaney MeigsMaija Tabor Ichelle Harral, PT [phone]

## 2016-11-20 NOTE — Progress Notes (Signed)
OT Cancellation Note  Patient Details Name: Chase Ayala MRN: [id number] DOB: 06/25/1990   Cancelled Treatment:    Reason Eval/Treat Not Completed: Patient not medically ready (pending surgery)  Harolyn RutherfordJones, Arlesia Kiel B  [phone] 11/20/2016, 9:01 AM

## 2016-11-20 NOTE — Op Note (Signed)
11/05/2016 - 11/20/2016  8:43 AM  PATIENT:  Chase Ayala  26 y.o. male  PRE-OPERATIVE DIAGNOSIS:  STATUS-POST SMALL BOWEL RESECTION, OPEN ABDOMEN  POST-OPERATIVE DIAGNOSIS:  STATUS-POST SMALL BOWEL RESECTION, OPEN ABDOMEN, ISCHEMIC CONTUSED SEGMENT OF ILEUM  PROCEDURE:  Procedure(s): EXPLORATORY LAPAROTOMY SMALL BOWEL RESECTION ABDOMINAL VACUUM ASSISTED CLOSURE CHANGE  SURGEON:  Surgeon(s): Violeta Gelinashompson, Nathaniel Yaden, MD  ASSISTANTS: Mattie MarlinJessica Focht, PAC (scrubbed throughout the procedure and necessary for retraction and assistance)  ANESTHESIA:   general  EBL:  Total I/O In: -  Out: 300 [Urine:250; Blood:50]  BLOOD ADMINISTERED:none  DRAINS: Abthera Advance   SPECIMEN:  Excision  DISPOSITION OF SPECIMEN:  PATHOLOGY  COUNTS:  YES  DICTATION: .Dragon Dictation Findings: 40 cm of bowel proximal to the previous resection was very contused and of questionable viability  Procedure in detail: Mr. Chase Ayala returns to the operating room for exploratory laparotomy. Informed consent was obtained from his mother. He is on IV antibiotics. He was brought to the operating room and general endotracheal anesthesia was administered by the anesthesia staff. The outer portions of his VAC were removed leaving the inner drape. The abdomen was prepped and draped in a sterile fashion. We did a time out procedure. The inner drape was carefully removed from the bowel. There was cloudy fluid present but no succus. The small bowel was gently freed up from some mild adhesions. The omentum and transverse colon were lifted up off the small bowel. The small bowel was then run from the ligament of Treitz down to the terminal ileum. There were some scattered small contused areas in the jejunum and as we got down into the ileum about 40 cm proximal to the previous resection there was heavy contusion and some questionable viability. No perforations. Next the remainder of the bowel was run distal to the resection down to the  terminal ileum and this appeared viable. I made the decision to resect about 40 cm proximal to the previous resection. It was divided with a GIA-75. The mesentery was taken down with LigaSure. The specimen was sent to pathology with a marking suture. The proximal bowel was still extremely dilated so I elected to make a small enterotomy. The sucker was used to evacuate the proximal bowel contents. I felt this would decrease his risk of aspiration. Next we resected that small further portion of the small bowel again with GIA-75. Mesentery was taken down with the LigaSure. The bowel was left in discontinuity. The abdomen was copiously irrigated with multiple liters of saline. The mesentery was rechecked and there was excellent hemostasis. Bowel was returned to anatomic position. I then placed a new Abthera Advance open abdomen VAC device in standard fashion. The inner drape was tucked around all of the bowel carefully. The 2 blue sponges were fashioned on top of that. The abdominal wall skin was cleaned and prepped. VAC drapes were applied and it was hooked up to the VAC pump with excellent seal. All counts were correct. He tolerated the procedure without apparent complication and was taken directly back to the intensive care unit on the ventilator in critical condition. PATIENT DISPOSITION:  ICU - intubated and critically ill.   Delay start of Pharmacological VTE agent (>24hrs) due to surgical blood loss or risk of bleeding:  no  Violeta GelinasBurke Cassius Cullinane, MD, MPH, FACS Pager: [phone]  9/7/20188:43 AM
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
## 2016-11-20 NOTE — Anesthesia Postprocedure Evaluation (Signed)
Anesthesia Post Note  Patient: Chase Ayala  Procedure(s) Performed: Procedure(s) (LRB): EXPLORATORY LAPAROTOMY (N/A) SMALL BOWEL RESECTION (N/A) ABDOMINAL VACUUM ASSISTED CLOSURE CHANGE (N/A)     Patient location during evaluation: ICU Anesthesia Type: General Level of consciousness: sedated Pain management: pain level controlled Vital Signs Assessment: post-procedure vital signs reviewed and stable Respiratory status: patient connected to tracheostomy mask oxygen Cardiovascular status: stable Postop Assessment: no signs of nausea or vomiting Anesthetic complications: no    Last Vitals:  Vitals:   11/20/16 0925 11/20/16 1000  BP:  (!) 106/53  Pulse:  (!) 104  Resp:  19  Temp: 36.9 C   SpO2: 97%     Last Pain:  Vitals:   11/20/16 0925  TempSrc: Oral  PainSc:                  Chase Ayala,Chase Bucher EDWARD
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
## 2016-11-20 NOTE — Progress Notes (Addendum)
CRITICAL VALUE ALERT  Critical Value:  Hemoglobin 5.9  Date & Time Notied:  11/20/16 0510  Provider Notified: Dr. Gaynelle AduEric Wilson  Orders Received/Actions taken:Transfuse 2 units of PRBs  0625 Labs redrawn: Hemoglobin 7.9

## 2016-11-21 ENCOUNTER — Inpatient Hospital Stay (HOSPITAL_COMMUNITY): Payer: Self-pay

## 2016-11-21 LAB — BASIC METABOLIC PANEL
ANION GAP: 9 (ref 5–15)
BUN: 8 mg/dL (ref 6–20)
CHLORIDE: 110 mmol/L (ref 101–111)
CO2: 21 mmol/L — ABNORMAL LOW (ref 22–32)
Calcium: 7.3 mg/dL — ABNORMAL LOW (ref 8.9–10.3)
Creatinine, Ser: 0.79 mg/dL (ref 0.61–1.24)
Glucose, Bld: 94 mg/dL (ref 65–99)
POTASSIUM: 4.5 mmol/L (ref 3.5–5.1)
SODIUM: 140 mmol/L (ref 135–145)

## 2016-11-21 LAB — GLUCOSE, CAPILLARY
GLUCOSE-CAPILLARY: 88 mg/dL (ref 65–99)
GLUCOSE-CAPILLARY: 77 mg/dL (ref 65–99)
Glucose-Capillary: 82 mg/dL (ref 65–99)
Glucose-Capillary: 81 mg/dL (ref 65–99)
Glucose-Capillary: 88 mg/dL (ref 65–99)

## 2016-11-21 LAB — CBC
HCT: 24.2 % — ABNORMAL LOW (ref 39.0–52.0)
HEMOGLOBIN: 7.8 g/dL — AB (ref 13.0–17.0)
MCH: 30.7 pg (ref 26.0–34.0)
MCHC: 32.2 g/dL (ref 30.0–36.0)
MCV: 95.3 fL (ref 78.0–100.0)
Platelets: 605 10*3/uL — ABNORMAL HIGH (ref 150–400)
RBC: 2.54 MIL/uL — AB (ref 4.22–5.81)
RDW: 15.3 % (ref 11.5–15.5)
WBC: 16.1 10*3/uL — AB (ref 4.0–10.5)

## 2016-11-21 MED ORDER — SODIUM CHLORIDE 0.9% FLUSH
10.0000 mL | Freq: Two times a day (BID) | INTRAVENOUS | Status: DC
  Administered 2016-11-22: 30 mL
  Administered 2016-11-22 – 2016-12-05 (×21): 10 mL
  Administered 2016-12-06: 20 mL
  Administered 2016-12-07 (×2): 10 mL
  Administered 2016-12-08: 30 mL
  Administered 2016-12-08 – 2016-12-10 (×4): 10 mL
  Administered 2016-12-10: 20 mL
  Administered 2016-12-11 – 2016-12-15 (×6): 10 mL

## 2016-11-21 MED ORDER — INSULIN ASPART 100 UNIT/ML ~~LOC~~ SOLN
0.0000 [IU] | SUBCUTANEOUS | Status: DC
  Administered 2016-11-23 – 2016-12-07 (×36): 2 [IU] via SUBCUTANEOUS

## 2016-11-21 MED ORDER — CHLORHEXIDINE GLUCONATE CLOTH 2 % EX PADS
6.0000 | MEDICATED_PAD | Freq: Every day | CUTANEOUS | Status: DC
  Administered 2016-11-22 – 2016-11-23 (×3): 6 via TOPICAL

## 2016-11-21 MED ORDER — SODIUM CHLORIDE 0.9% FLUSH
10.0000 mL | INTRAVENOUS | Status: DC | PRN
  Administered 2016-11-21 – 2016-12-07 (×2): 20 mL
  Filled 2016-11-21: qty 40
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
## 2016-11-21 NOTE — Progress Notes (Addendum)
Disregard, charted on wrong patient

## 2016-11-21 NOTE — Progress Notes (Signed)
Patient ID: Chase Ayala, male   DOB: Apr 10, 1990, 26 y.o.   MRN: [id number] Patient chemically sedated reportedly 1 patient light on sedation moves 3 extremities well and then apparently at his neurologic baseline. Has had to undergo multiple abdominal procedures for perforated bowel most recent of which was yesterday. No new Neurosurgical recommendations continue c-collar for now

## 2016-11-22 LAB — DIFFERENTIAL
BASOS PCT: 1 %
Basophils Absolute: 0.2 10*3/uL — ABNORMAL HIGH (ref 0.0–0.1)
EOS ABS: 0.3 10*3/uL (ref 0.0–0.7)
Eosinophils Relative: 2 %
LYMPHS PCT: 12 %
Lymphs Abs: 1.8 10*3/uL (ref 0.7–4.0)
MONO ABS: 1.2 10*3/uL — AB (ref 0.1–1.0)
Monocytes Relative: 8 %
NEUTROS ABS: 11.5 10*3/uL — AB (ref 1.7–7.7)
NEUTROS PCT: 77 %

## 2016-11-22 LAB — COMPREHENSIVE METABOLIC PANEL
ALT: 37 U/L (ref 17–63)
ANION GAP: 7 (ref 5–15)
AST: 50 U/L — ABNORMAL HIGH (ref 15–41)
Albumin: 1.4 g/dL — ABNORMAL LOW (ref 3.5–5.0)
Alkaline Phosphatase: 64 U/L (ref 38–126)
BUN: 5 mg/dL — ABNORMAL LOW (ref 6–20)
CHLORIDE: 109 mmol/L (ref 101–111)
CO2: 22 mmol/L (ref 22–32)
Calcium: 7.5 mg/dL — ABNORMAL LOW (ref 8.9–10.3)
Creatinine, Ser: 0.73 mg/dL (ref 0.61–1.24)
Glucose, Bld: 83 mg/dL (ref 65–99)
POTASSIUM: 4.4 mmol/L (ref 3.5–5.1)
Sodium: 138 mmol/L (ref 135–145)
Total Bilirubin: 2.1 mg/dL — ABNORMAL HIGH (ref 0.3–1.2)
Total Protein: 4.4 g/dL — ABNORMAL LOW (ref 6.5–8.1)

## 2016-11-22 LAB — AEROBIC/ANAEROBIC CULTURE W GRAM STAIN (SURGICAL/DEEP WOUND)

## 2016-11-22 LAB — GLUCOSE, CAPILLARY
GLUCOSE-CAPILLARY: 98 mg/dL (ref 65–99)
Glucose-Capillary: 81 mg/dL (ref 65–99)
Glucose-Capillary: 76 mg/dL (ref 65–99)
Glucose-Capillary: 77 mg/dL (ref 65–99)
Glucose-Capillary: 80 mg/dL (ref 65–99)
Glucose-Capillary: 80 mg/dL (ref 65–99)

## 2016-11-22 LAB — CBC
HCT: 23.8 % — ABNORMAL LOW (ref 39.0–52.0)
Hemoglobin: 7.5 g/dL — ABNORMAL LOW (ref 13.0–17.0)
MCH: 30.5 pg (ref 26.0–34.0)
MCHC: 31.5 g/dL (ref 30.0–36.0)
MCV: 96.7 fL (ref 78.0–100.0)
PLATELETS: 578 10*3/uL — AB (ref 150–400)
RBC: 2.46 MIL/uL — ABNORMAL LOW (ref 4.22–5.81)
RDW: 15.5 % (ref 11.5–15.5)
WBC: 15 10*3/uL — ABNORMAL HIGH (ref 4.0–10.5)

## 2016-11-22 LAB — AEROBIC/ANAEROBIC CULTURE (SURGICAL/DEEP WOUND)

## 2016-11-22 LAB — PHOSPHORUS: PHOSPHORUS: 4.1 mg/dL (ref 2.5–4.6)

## 2016-11-22 LAB — TRIGLYCERIDES: TRIGLYCERIDES: 267 mg/dL — AB (ref <150)

## 2016-11-22 LAB — MAGNESIUM: MAGNESIUM: 1.4 mg/dL — AB (ref 1.7–2.4)

## 2016-11-22 LAB — PREALBUMIN: PREALBUMIN: 5.3 mg/dL — AB (ref 18–38)

## 2016-11-22 MED ORDER — POTASSIUM CHLORIDE IN NACL 20-0.9 MEQ/L-% IV SOLN
INTRAVENOUS | Status: DC
  Administered 2016-11-22 – 2016-11-23 (×3): via INTRAVENOUS
  Filled 2016-11-22 (×4): qty 1000

## 2016-11-22 MED ORDER — MAGNESIUM SULFATE 50 % IJ SOLN
3.0000 g | Freq: Once | INTRAVENOUS | Status: AC
  Administered 2016-11-22: 3 g via INTRAVENOUS
  Filled 2016-11-22: qty 6

## 2016-11-22 MED ORDER — TRACE MINERALS CR-CU-MN-SE-ZN 10-1000-500-60 MCG/ML IV SOLN
INTRAVENOUS | Status: AC
  Administered 2016-11-22: 18:00:00 via INTRAVENOUS
  Filled 2016-11-22: qty 720
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
## 2016-11-22 NOTE — Plan of Care (Signed)
Problem: Pain Managment: Goal: General experience of comfort will improve Outcome: Progressing Pt on fentanyl and versed w/ adequate pain management.  Problem: Activity: Goal: Risk for activity intolerance will decrease Outcome: Not Progressing Pt remains immobile. Small movements in RUE. No movement LUE. Purposeful movements in RLE and LLE, intermittently moves to command.  Problem: Bowel/Gastric: Goal: Will not experience complications related to bowel motility Outcome: Not Progressing Pt had ischemic bowel, open abd w/ wound vac at this time.
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
## 2016-11-23 ENCOUNTER — Inpatient Hospital Stay (HOSPITAL_COMMUNITY): Payer: Self-pay | Admitting: Anesthesiology

## 2016-11-23 ENCOUNTER — Encounter (HOSPITAL_COMMUNITY): Admission: EM | Disposition: A | Discharge status: Rehabilitation Facility | Payer: Self-pay | Source: Home / Self Care

## 2016-11-23 HISTORY — PX: LAPAROTOMY: SHX154

## 2016-11-23 LAB — COMPREHENSIVE METABOLIC PANEL
ALT: 38 U/L (ref 17–63)
AST: 45 U/L — ABNORMAL HIGH (ref 15–41)
Albumin: 1.4 g/dL — ABNORMAL LOW (ref 3.5–5.0)
Alkaline Phosphatase: 73 U/L (ref 38–126)
Anion gap: 6 (ref 5–15)
BUN: <5 mg/dL — ABNORMAL LOW (ref 6–20)
CHLORIDE: 104 mmol/L (ref 101–111)
CO2: 25 mmol/L (ref 22–32)
Calcium: 7.5 mg/dL — ABNORMAL LOW (ref 8.9–10.3)
Creatinine, Ser: 0.66 mg/dL (ref 0.61–1.24)
Glucose, Bld: 100 mg/dL — ABNORMAL HIGH (ref 65–99)
POTASSIUM: 4.1 mmol/L (ref 3.5–5.1)
SODIUM: 135 mmol/L (ref 135–145)
Total Bilirubin: 1.8 mg/dL — ABNORMAL HIGH (ref 0.3–1.2)
Total Protein: 4.4 g/dL — ABNORMAL LOW (ref 6.5–8.1)

## 2016-11-23 LAB — DIFFERENTIAL
BASOS ABS: 0 10*3/uL (ref 0.0–0.1)
Basophils Relative: 0 %
Eosinophils Absolute: 0.2 10*3/uL (ref 0.0–0.7)
Eosinophils Relative: 2 %
LYMPHS PCT: 12 %
Lymphs Abs: 1.4 10*3/uL (ref 0.7–4.0)
Monocytes Absolute: 1.3 10*3/uL — ABNORMAL HIGH (ref 0.1–1.0)
Monocytes Relative: 11 %
NEUTROS ABS: 8.7 10*3/uL — AB (ref 1.7–7.7)
Neutrophils Relative %: 75 %

## 2016-11-23 LAB — GLUCOSE, CAPILLARY
GLUCOSE-CAPILLARY: 104 mg/dL — AB (ref 65–99)
GLUCOSE-CAPILLARY: 110 mg/dL — AB (ref 65–99)
GLUCOSE-CAPILLARY: 125 mg/dL — AB (ref 65–99)
Glucose-Capillary: 114 mg/dL — ABNORMAL HIGH (ref 65–99)
Glucose-Capillary: 121 mg/dL — ABNORMAL HIGH (ref 65–99)
Glucose-Capillary: 140 mg/dL — ABNORMAL HIGH (ref 65–99)
Glucose-Capillary: 94 mg/dL (ref 65–99)

## 2016-11-23 LAB — CBC
HEMATOCRIT: 23.8 % — AB (ref 39.0–52.0)
HEMOGLOBIN: 7.7 g/dL — AB (ref 13.0–17.0)
MCH: 30.8 pg (ref 26.0–34.0)
MCHC: 32.4 g/dL (ref 30.0–36.0)
MCV: 95.2 fL (ref 78.0–100.0)
Platelets: 548 10*3/uL — ABNORMAL HIGH (ref 150–400)
RBC: 2.5 MIL/uL — AB (ref 4.22–5.81)
RDW: 15 % (ref 11.5–15.5)
WBC: 11.6 10*3/uL — AB (ref 4.0–10.5)

## 2016-11-23 LAB — MAGNESIUM: MAGNESIUM: 1.4 mg/dL — AB (ref 1.7–2.4)

## 2016-11-23 LAB — PHOSPHORUS: PHOSPHORUS: 4 mg/dL (ref 2.5–4.6)

## 2016-11-23 LAB — TRIGLYCERIDES: TRIGLYCERIDES: 286 mg/dL — AB (ref <150)

## 2016-11-23 LAB — PREALBUMIN: PREALBUMIN: 7 mg/dL — AB (ref 18–38)

## 2016-11-23 SURGERY — LAPAROTOMY, EXPLORATORY
Anesthesia: General | Site: Abdomen

## 2016-11-23 MED ORDER — PROPOFOL 10 MG/ML IV BOLUS
INTRAVENOUS | Status: DC | PRN
  Administered 2016-11-23: 60 mg via INTRAVENOUS

## 2016-11-23 MED ORDER — PROPOFOL 10 MG/ML IV BOLUS
INTRAVENOUS | Status: AC
  Filled 2016-11-23: qty 20

## 2016-11-23 MED ORDER — MIDAZOLAM HCL 2 MG/2ML IJ SOLN
2.0000 mg | INTRAMUSCULAR | Status: DC | PRN
  Administered 2016-11-30: 2 mg via INTRAVENOUS

## 2016-11-23 MED ORDER — PHENYLEPHRINE HCL 10 MG/ML IJ SOLN
INTRAMUSCULAR | Status: DC | PRN
  Administered 2016-11-23 (×2): 120 ug via INTRAVENOUS
  Administered 2016-11-23 (×2): 80 ug via INTRAVENOUS

## 2016-11-23 MED ORDER — CHLORHEXIDINE GLUCONATE CLOTH 2 % EX PADS
6.0000 | MEDICATED_PAD | Freq: Every day | CUTANEOUS | Status: DC
  Administered 2016-11-24 – 2016-12-14 (×17): 6 via TOPICAL

## 2016-11-23 MED ORDER — LACTATED RINGERS IV SOLN
INTRAVENOUS | Status: DC | PRN
  Administered 2016-11-23: 08:00:00 via INTRAVENOUS

## 2016-11-23 MED ORDER — 0.9 % SODIUM CHLORIDE (POUR BTL) OPTIME
TOPICAL | Status: DC | PRN
  Administered 2016-11-23: 1000 mL

## 2016-11-23 MED ORDER — MIDAZOLAM HCL 5 MG/5ML IJ SOLN
INTRAMUSCULAR | Status: DC | PRN
  Administered 2016-11-23: 2 mg via INTRAVENOUS

## 2016-11-23 MED ORDER — TRACE MINERALS CR-CU-MN-SE-ZN 10-1000-500-60 MCG/ML IV SOLN
INTRAVENOUS | Status: AC
  Administered 2016-11-23: 18:00:00 via INTRAVENOUS
  Filled 2016-11-23: qty 1440

## 2016-11-23 MED ORDER — DEXMEDETOMIDINE HCL IN NACL 200 MCG/50ML IV SOLN
0.4000 ug/kg/h | INTRAVENOUS | Status: DC
  Administered 2016-11-23: 1.2 ug/kg/h via INTRAVENOUS
  Administered 2016-11-23: 0.4 ug/kg/h via INTRAVENOUS
  Filled 2016-11-23 (×2): qty 50

## 2016-11-23 MED ORDER — DEXMEDETOMIDINE HCL IN NACL 400 MCG/100ML IV SOLN
0.4000 ug/kg/h | INTRAVENOUS | Status: DC
  Administered 2016-11-23 (×2): 1.2 ug/kg/h via INTRAVENOUS
  Administered 2016-11-24 (×4): 1.5 ug/kg/h via INTRAVENOUS
  Administered 2016-11-24 (×2): 1.2 ug/kg/h via INTRAVENOUS
  Administered 2016-11-24: 1.5 ug/kg/h via INTRAVENOUS
  Administered 2016-11-24: 1.2 ug/kg/h via INTRAVENOUS
  Administered 2016-11-25: 1.8 ug/kg/h via INTRAVENOUS
  Administered 2016-11-25: 2 ug/kg/h via INTRAVENOUS
  Administered 2016-11-25 (×8): 1.8 ug/kg/h via INTRAVENOUS
  Administered 2016-11-25 – 2016-11-28 (×29): 2 ug/kg/h via INTRAVENOUS
  Administered 2016-11-28: 1 ug/kg/h via INTRAVENOUS
  Administered 2016-11-28 (×2): 1.2 ug/kg/h via INTRAVENOUS
  Administered 2016-11-28: 2 ug/kg/h via INTRAVENOUS
  Administered 2016-11-28 – 2016-11-29 (×6): 1.2 ug/kg/h via INTRAVENOUS
  Administered 2016-11-29: 1.4 ug/kg/h via INTRAVENOUS
  Administered 2016-11-29: 1.6 ug/kg/h via INTRAVENOUS
  Administered 2016-11-29: 1.4 ug/kg/h via INTRAVENOUS
  Administered 2016-11-29 (×2): 1.2 ug/kg/h via INTRAVENOUS
  Administered 2016-11-30 (×3): 1.6 ug/kg/h via INTRAVENOUS
  Filled 2016-11-23 (×70): qty 100

## 2016-11-23 MED ORDER — MAGNESIUM SULFATE 4 GM/100ML IV SOLN
4.0000 g | Freq: Once | INTRAVENOUS | Status: AC
  Administered 2016-11-23: 4 g via INTRAVENOUS
  Filled 2016-11-23: qty 100

## 2016-11-23 MED ORDER — MIDAZOLAM HCL 2 MG/2ML IJ SOLN
3.0000 mg | INTRAMUSCULAR | Status: DC | PRN
  Administered 2016-11-24: 5 mg via INTRAVENOUS
  Filled 2016-11-23: qty 6

## 2016-11-23 MED ORDER — FENTANYL CITRATE (PF) 250 MCG/5ML IJ SOLN
INTRAMUSCULAR | Status: AC
  Filled 2016-11-23: qty 5

## 2016-11-23 MED ORDER — MIDAZOLAM HCL 2 MG/2ML IJ SOLN
INTRAMUSCULAR | Status: AC
  Filled 2016-11-23: qty 2

## 2016-11-23 MED ORDER — FENTANYL CITRATE (PF) 100 MCG/2ML IJ SOLN
INTRAMUSCULAR | Status: DC | PRN
  Administered 2016-11-23 (×2): 50 ug via INTRAVENOUS

## 2016-11-23 MED ORDER — ROCURONIUM BROMIDE 100 MG/10ML IV SOLN
INTRAVENOUS | Status: DC | PRN
  Administered 2016-11-23 (×3): 50 mg via INTRAVENOUS

## 2016-11-23 SURGICAL SUPPLY — 40 items
ABTHERA ADVANCE ×3 IMPLANT
BENZOIN TINCTURE PRP APPL 2/3 (GAUZE/BANDAGES/DRESSINGS) ×3 IMPLANT
CANISTER SUCT 3000ML PPV (MISCELLANEOUS) ×3 IMPLANT
COVER SURGICAL LIGHT HANDLE (MISCELLANEOUS) ×3 IMPLANT
DRAPE LAPAROSCOPIC ABDOMINAL (DRAPES) ×3 IMPLANT
DRAPE WARM FLUID 44X44 (DRAPE) ×3 IMPLANT
DRSG OPSITE POSTOP 4X10 (GAUZE/BANDAGES/DRESSINGS) IMPLANT
DRSG OPSITE POSTOP 4X8 (GAUZE/BANDAGES/DRESSINGS) IMPLANT
DRSG VAC ATS LRG SENSATRAC (GAUZE/BANDAGES/DRESSINGS) ×3 IMPLANT
ELECT BLADE 6.5 EXT (BLADE) IMPLANT
ELECT CAUTERY BLADE 6.4 (BLADE) ×3 IMPLANT
ELECT REM PT RETURN 9FT ADLT (ELECTROSURGICAL) ×3
ELECTRODE REM PT RTRN 9FT ADLT (ELECTROSURGICAL) ×1 IMPLANT
GLOVE BIO SURGEON STRL SZ8 (GLOVE) ×3 IMPLANT
GLOVE BIOGEL PI IND STRL 8 (GLOVE) ×1 IMPLANT
GLOVE BIOGEL PI INDICATOR 8 (GLOVE) ×2
GOWN STRL REUS W/ TWL LRG LVL3 (GOWN DISPOSABLE) ×2 IMPLANT
GOWN STRL REUS W/ TWL XL LVL3 (GOWN DISPOSABLE) ×2 IMPLANT
GOWN STRL REUS W/TWL LRG LVL3 (GOWN DISPOSABLE) ×4
GOWN STRL REUS W/TWL XL LVL3 (GOWN DISPOSABLE) ×4
KIT BASIN OR (CUSTOM PROCEDURE TRAY) ×3 IMPLANT
KIT ROOM TURNOVER OR (KITS) ×3 IMPLANT
LIGASURE IMPACT 36 18CM CVD LR (INSTRUMENTS) IMPLANT
NS IRRIG 1000ML POUR BTL (IV SOLUTION) ×6 IMPLANT
PACK GENERAL/GYN (CUSTOM PROCEDURE TRAY) ×3 IMPLANT
PAD ARMBOARD 7.5X6 YLW CONV (MISCELLANEOUS) ×6 IMPLANT
SPECIMEN JAR LARGE (MISCELLANEOUS) ×3 IMPLANT
SPONGE LAP 18X18 X RAY DECT (DISPOSABLE) ×3 IMPLANT
STAPLER GUN LINEAR PROX 60 (STAPLE) ×3 IMPLANT
STAPLER PROXIMATE 75MM BLUE (STAPLE) ×3 IMPLANT
STAPLER VISISTAT 35W (STAPLE) ×3 IMPLANT
SUCTION POOLE TIP (SUCTIONS) ×3 IMPLANT
SUT NOVA 1 T20/GS 25DT (SUTURE) ×12 IMPLANT
SUT PDS AB 1 TP1 96 (SUTURE) ×3 IMPLANT
SUT SILK 2 0 SH CR/8 (SUTURE) ×3 IMPLANT
SUT SILK 2 0 TIES 10X30 (SUTURE) ×3 IMPLANT
SUT SILK 3 0 SH CR/8 (SUTURE) ×3 IMPLANT
SUT SILK 3 0 TIES 10X30 (SUTURE) ×3 IMPLANT
TOWEL OR 17X26 10 PK STRL BLUE (TOWEL DISPOSABLE) ×3 IMPLANT
WND VAC CANISTER 500ML (MISCELLANEOUS) ×3 IMPLANT

## 2016-11-23 NOTE — Anesthesia Postprocedure Evaluation (Signed)
Anesthesia Post Note  Patient: Chase Ayala  Procedure(s) Performed: Procedure(s) (LRB): EXPLORATORY LAPAROTOMY, SMALL BOWEL ANASTAMOSIS AND CLOSURE (N/A)     Patient location during evaluation: PACU Anesthesia Type: General Level of consciousness: awake and alert Pain management: pain level controlled Vital Signs Assessment: post-procedure vital signs reviewed and stable Respiratory status: spontaneous breathing, nonlabored ventilation, respiratory function stable and patient connected to nasal cannula oxygen Cardiovascular status: blood pressure returned to baseline and stable Postop Assessment: no signs of nausea or vomiting Anesthetic complications: no    Last Vitals:  Vitals:   11/23/16 0800 11/23/16 0915  BP:  118/75  Pulse:  (!) 111  Resp:  19  Temp: 36.8 C   SpO2:  100%    Last Pain:  Vitals:   11/23/16 0800  TempSrc: Axillary  PainSc:                  [name]
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
## 2016-11-23 NOTE — Progress Notes (Signed)
Pt to OR w/ OR RN, CRNA, and NT. CRNA using BVM to ventilate pt. Consent given to CRNA. Elink aware pt off unit and en route to OR. Cassandra, pt mother, updated and waiting in OR waiting area. VSS, NAD.

## 2016-11-23 NOTE — Anesthesia Postprocedure Evaluation (Signed)
Anesthesia Post Note  Patient: Chase Ayala  Procedure(s) Performed: Procedure(s) (LRB): EXPLORATORY LAPAROTOMY, SMALL BOWEL ANASTAMOSIS AND CLOSURE (N/A)     Patient location during evaluation: PACU Anesthesia Type: General Level of consciousness: awake and alert Pain management: pain level controlled Vital Signs Assessment: post-procedure vital signs reviewed and stable Respiratory status: spontaneous breathing, nonlabored ventilation, respiratory function stable and patient connected to nasal cannula oxygen Cardiovascular status: blood pressure returned to baseline and stable Postop Assessment: no signs of nausea or vomiting Anesthetic complications: no    Last Vitals:  Vitals:   11/23/16 0800 11/23/16 0915  BP:  118/75  Pulse:  (!) 111  Resp:  19  Temp: 36.8 C   SpO2:  100%    Last Pain:  Vitals:   11/23/16 0800  TempSrc: Axillary  PainSc:                  Yony Roulston     

## 2016-11-23 NOTE — Progress Notes (Signed)
OT Cancellation    11/23/16 0700  OT Visit Information  Last OT Received On 11/23/16  Reason Eval/Treat Not Completed Medical issues which prohibited therapy (pt in surgery)  [facility] Nahom Carfagno, OT/L  [phone] 11/23/2016

## 2016-11-23 NOTE — Addendum Note (Signed)
Addendum  created 11/23/16 1001 by Bethena Midgetddono, Lauralyn Shadowens, MD   Sign clinical note

## 2016-11-23 NOTE — Op Note (Signed)
11/05/2016 - 11/23/2016  8:54 AM  PATIENT:  Chase Ayala  26 y.o. male  PRE-OPERATIVE DIAGNOSIS:  Open abdomen status post small bowel resection  POST-OPERATIVE DIAGNOSIS:  Open abdomen status post small bowel resection, remainder of small bowel viable  PROCEDURE:  Procedure(s): EXPLORATORY LAPAROTOMY SMALL BOWEL ANASTAMOSIS CLOSURE OF ABDOMEN PLACEMENT OF NEGATIVE PRESSURE WOUND VAC  SURGEON:  Violeta GelinasBurke Vere Diantonio, M.D.  ASSISTANTS: Angelena Formhris White, MD; Mattie MarlinJessica Focht, [facility]   ANESTHESIA:   general  EBL:  Total I/O In: 800 [I.V.:800] Out: 350 [Urine:200; Blood:150]  BLOOD ADMINISTERED:none  DRAINS: none   SPECIMEN:  No Specimen  DISPOSITION OF SPECIMEN:  N/A  COUNTS:  YES  DICTATION: .Dragon Dictation Findings: Remaining small bowel viable, no other complicating features  Procedure in detail Mr. Chase Ayala returns for exploration status post small bowel resection with open abdomen. He is on IV antibiotics. He was brought directly from the intensive care unit to the operating room on the ventilator. Informed consent was obtained from his mother. The outer portions of his VAC were removed and his abdomen was prepped and draped in sterile fashion. We did a time out procedure. The inner VAC perforated she was irrigated and carefully removed from the bowel. The abdomen was explored. The small bowel was gently freed up from intraloop adhesions. There was no enteric contents present. The small bowel was freed up and then run from the ligament of Treitz down to the proximal resection margin VIABLE. The contusions in the bowel wall seemed improved. The distal small bowel staple line was also intact and the remaining ileum down to the ileocecal valve looked healthy as well. Decision was made to proceed with anastomosis. A side-to-side anastomosis was made between the 2 ends of ileum in standard fashion with GIA-75. Prior to placing the stapler, the proximal bowel contents was evacuated via the small  enterotomy it was created for the stapler. The common defect was closed with TA 60. Multiple to soaks were placed to oversew the staple line and get good hemostasis. The mesentery was closed with a couple interrupted silks. The bowel remained viable at the anastomosis and enteric content milked back and forth did not leak out. The bowel was placed back in anatomic position. We took down a few more omental adhesions. The area was irrigated. I then checked his peak airway pressures which were 26. I began closing his abdomen with interrupted #1 Novafil sutures from the superior and inferior portions of the fascial defect. As I continued, his peak airway pressures remained between 27 and 28. Was able to close his fascia without significant tension. Subcutaneous tissues were irrigated. We ensured hemostasis. I then fashioned a large VAC over the subcutaneous tissues and the VAC drapes were applied in a normal fashion. It was hooked up to suction and excellent seal was obtained. All counts were correct. He tolerated the procedure well without apparent complication and was taken directly back to the intensive care unit on the ventilator in critical condition. PATIENT DISPOSITION:  ICU - intubated and critically ill.   Delay start of Pharmacological VTE agent (>24hrs) due to surgical blood loss or risk of bleeding:  no  Violeta GelinasBurke Isaid Salvia, MD, MPH, FACS Pager: [phone]  9/10/20188:54 AM

## 2016-11-23 NOTE — Progress Notes (Signed)
PT Cancellation Note  Patient Details Name: Chase Ayala MRN: [id number] DOB: 1990-03-22   Cancelled Treatment:    Reason Eval/Treat Not Completed: Medical issues which prohibited therapy.  Pt is in surgery.  PT will check back tomorrow.  Thanks,    Rollene Rotundaebecca B. Kaylee Wombles, PT, DPT [phone]   11/23/2016, 9:04 AM

## 2016-11-23 NOTE — Progress Notes (Signed)
SLP Cancellation Note  Patient Details Name: Chase Ayala MRN: [id number] DOB: 12-08-1990   Cancelled treatment:       Reason Eval/Treat Not Completed: Patient at procedure or test/unavailable. Patient in surgery.   Ferdinand LangoLeah Roy Tokarz MA, CCC-SLP [phone]    [name] Meryl 11/23/2016, 8:30 AM

## 2016-11-23 NOTE — Transfer of Care (Signed)
Immediate Anesthesia Transfer of Care Note  Patient: Chase Ayala  Procedure(s) Performed: Procedure(s): EXPLORATORY LAPAROTOMY, SMALL BOWEL ANASTAMOSIS AND CLOSURE (N/A)  Patient Location: ICU  Anesthesia Type:General  Level of Consciousness: Patient remains intubated per anesthesia plan  Airway & Oxygen Therapy: Patient remains intubated per anesthesia plan and Patient placed on Ventilator (see vital sign flow sheet for setting)  Post-op Assessment: Report given to RN and Post -op Vital signs reviewed and stable  Post vital signs: Reviewed and stable  Last Vitals:  Vitals:   11/23/16 0530 11/23/16 0700  BP: 130/75 137/69  Pulse: (!) 117 (!) 105  Resp: 17 16  Temp:    SpO2:      Last Pain:  Vitals:   11/23/16 0400  TempSrc: Axillary  PainSc:          Complications: No apparent anesthesia complications

## 2016-11-24 ENCOUNTER — Encounter (HOSPITAL_COMMUNITY): Payer: Self-pay | Admitting: General Surgery

## 2016-11-24 ENCOUNTER — Inpatient Hospital Stay (HOSPITAL_COMMUNITY): Payer: Self-pay

## 2016-11-24 LAB — TYPE AND SCREEN
ABO/RH(D): O POS
Antibody Screen: NEGATIVE
Unit division: 0
Unit division: 0

## 2016-11-24 LAB — GLUCOSE, CAPILLARY
GLUCOSE-CAPILLARY: 115 mg/dL — AB (ref 65–99)
GLUCOSE-CAPILLARY: 117 mg/dL — AB (ref 65–99)
GLUCOSE-CAPILLARY: 119 mg/dL — AB (ref 65–99)
Glucose-Capillary: 131 mg/dL — ABNORMAL HIGH (ref 65–99)
Glucose-Capillary: 147 mg/dL — ABNORMAL HIGH (ref 65–99)
Glucose-Capillary: 123 mg/dL — ABNORMAL HIGH (ref 65–99)

## 2016-11-24 LAB — BPAM RBC
Blood Product Expiration Date: 201809122359
Blood Product Expiration Date: 201810032359
UNIT TYPE AND RH: 5100
Unit Type and Rh: 9500

## 2016-11-24 LAB — COMPREHENSIVE METABOLIC PANEL
ALT: 28 U/L (ref 17–63)
AST: 39 U/L (ref 15–41)
Albumin: 1.1 g/dL — ABNORMAL LOW (ref 3.5–5.0)
Alkaline Phosphatase: 61 U/L (ref 38–126)
Anion gap: 3 — ABNORMAL LOW (ref 5–15)
BILIRUBIN TOTAL: 1.3 mg/dL — AB (ref 0.3–1.2)
BUN: 6 mg/dL (ref 6–20)
CALCIUM: 6.5 mg/dL — AB (ref 8.9–10.3)
CHLORIDE: 109 mmol/L (ref 101–111)
CO2: 25 mmol/L (ref 22–32)
Creatinine, Ser: 0.56 mg/dL — ABNORMAL LOW (ref 0.61–1.24)
Glucose, Bld: 114 mg/dL — ABNORMAL HIGH (ref 65–99)
Potassium: 4 mmol/L (ref 3.5–5.1)
Sodium: 137 mmol/L (ref 135–145)
TOTAL PROTEIN: 3.6 g/dL — AB (ref 6.5–8.1)

## 2016-11-24 LAB — CBC
HEMATOCRIT: 23.1 % — AB (ref 39.0–52.0)
Hemoglobin: 7.5 g/dL — ABNORMAL LOW (ref 13.0–17.0)
MCH: 30.9 pg (ref 26.0–34.0)
MCHC: 32.5 g/dL (ref 30.0–36.0)
MCV: 95.1 fL (ref 78.0–100.0)
PLATELETS: 424 10*3/uL — AB (ref 150–400)
RBC: 2.43 MIL/uL — AB (ref 4.22–5.81)
RDW: 15 % (ref 11.5–15.5)
WBC: 14.5 10*3/uL — ABNORMAL HIGH (ref 4.0–10.5)

## 2016-11-24 LAB — PHOSPHORUS: PHOSPHORUS: 3.6 mg/dL (ref 2.5–4.6)

## 2016-11-24 LAB — MAGNESIUM: MAGNESIUM: 1.4 mg/dL — AB (ref 1.7–2.4)

## 2016-11-24 MED ORDER — POTASSIUM CHLORIDE IN NACL 20-0.9 MEQ/L-% IV SOLN
INTRAVENOUS | Status: DC
  Administered 2016-11-24 – 2016-12-09 (×5): via INTRAVENOUS
  Filled 2016-11-24 (×6): qty 1000

## 2016-11-24 MED ORDER — ONDANSETRON HCL 4 MG/2ML IJ SOLN
4.0000 mg | Freq: Once | INTRAMUSCULAR | Status: AC
  Administered 2016-11-24: 4 mg via INTRAVENOUS
  Filled 2016-11-24: qty 2

## 2016-11-24 MED ORDER — SODIUM CHLORIDE 0.9 % IV SOLN
1.0000 mg/h | INTRAVENOUS | Status: DC
  Administered 2016-11-24 (×2): 2 mg/h via INTRAVENOUS
  Administered 2016-11-25 (×2): 4 mg/h via INTRAVENOUS
  Administered 2016-11-26 – 2016-11-28 (×6): 6 mg/h via INTRAVENOUS
  Administered 2016-11-28: 10 mg/h via INTRAVENOUS
  Administered 2016-11-28: 8 mg/h via INTRAVENOUS
  Administered 2016-11-28: 10 mg/h via INTRAVENOUS
  Administered 2016-11-28 (×2): 2 mg/h via INTRAVENOUS
  Administered 2016-11-29: 3 mg/h via INTRAVENOUS
  Administered 2016-11-29: 10 mg/h via INTRAVENOUS
  Administered 2016-11-30: 3 mg/h via INTRAVENOUS
  Administered 2016-12-01: 4 mg/h via INTRAVENOUS
  Administered 2016-12-02: 8 mg/h via INTRAVENOUS
  Administered 2016-12-02: 10 mg/h via INTRAVENOUS
  Administered 2016-12-03: 6 mg/h via INTRAVENOUS
  Administered 2016-12-03: 9 mg/h via INTRAVENOUS
  Administered 2016-12-03: 2 mg/h via INTRAVENOUS
  Administered 2016-12-04: 9 mg/h via INTRAVENOUS
  Administered 2016-12-05: 1 mg/h via INTRAVENOUS
  Administered 2016-12-07: 2 mg/h via INTRAVENOUS
  Filled 2016-11-24: qty 10
  Filled 2016-11-24 (×2): qty 20
  Filled 2016-11-24: qty 10
  Filled 2016-11-24: qty 20
  Filled 2016-11-24 (×2): qty 10
  Filled 2016-11-24: qty 20
  Filled 2016-11-24 (×2): qty 10
  Filled 2016-11-24: qty 20
  Filled 2016-11-24 (×2): qty 10
  Filled 2016-11-24 (×5): qty 20
  Filled 2016-11-24 (×2): qty 10
  Filled 2016-11-24: qty 20
  Filled 2016-11-24 (×2): qty 10
  Filled 2016-11-24: qty 20
  Filled 2016-11-24: qty 10

## 2016-11-24 MED ORDER — ONDANSETRON HCL 4 MG/2ML IJ SOLN
INTRAMUSCULAR | Status: AC
  Filled 2016-11-24: qty 2

## 2016-11-24 MED ORDER — TRACE MINERALS CR-CU-MN-SE-ZN 10-1000-500-60 MCG/ML IV SOLN
INTRAVENOUS | Status: AC
  Administered 2016-11-24: 19:00:00 via INTRAVENOUS
  Filled 2016-11-24: qty 1992

## 2016-11-24 NOTE — OR Nursing (Signed)
Added a supply item.

## 2016-11-24 NOTE — Progress Notes (Signed)
Paged Dr. Janee Mornhompson re: increased abd distention from this morning. Order for zofran. Will continue to monitor.
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
## 2016-11-24 NOTE — Plan of Care (Signed)
Problem: Nutritional: Goal: Dietary intake will improve Outcome: Not Progressing Pt receiving TPN for nutrition, unable to restart tube feedings at this time.

## 2016-11-24 NOTE — Progress Notes (Signed)
PT Cancellation Note  Patient Details Name: Chase Ayala MRN: [id number] DOB: 1990-09-28   Cancelled Treatment:    Reason Eval/Treat Not Completed: Other (comment).  Spoke to trauma MD who recommended holding today as pt is needing extra sedation to help with restlessness.  Thanks,    Rollene Rotundaebecca B. Jayvien Rowlette, PT, DPT [phone]   11/24/2016, 1:06 PM
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
## 2016-11-25 ENCOUNTER — Inpatient Hospital Stay (HOSPITAL_COMMUNITY): Payer: Self-pay

## 2016-11-25 LAB — GLUCOSE, CAPILLARY
GLUCOSE-CAPILLARY: 106 mg/dL — AB (ref 65–99)
GLUCOSE-CAPILLARY: 114 mg/dL — AB (ref 65–99)
GLUCOSE-CAPILLARY: 125 mg/dL — AB (ref 65–99)
GLUCOSE-CAPILLARY: 133 mg/dL — AB (ref 65–99)
Glucose-Capillary: 113 mg/dL — ABNORMAL HIGH (ref 65–99)
Glucose-Capillary: 115 mg/dL — ABNORMAL HIGH (ref 65–99)

## 2016-11-25 LAB — CBC
HEMATOCRIT: 20.7 % — AB (ref 39.0–52.0)
HEMOGLOBIN: 6.7 g/dL — AB (ref 13.0–17.0)
MCH: 30.9 pg (ref 26.0–34.0)
MCHC: 32.4 g/dL (ref 30.0–36.0)
MCV: 95.4 fL (ref 78.0–100.0)
Platelets: 373 10*3/uL (ref 150–400)
RBC: 2.17 MIL/uL — AB (ref 4.22–5.81)
RDW: 15.5 % (ref 11.5–15.5)
WBC: 12.9 10*3/uL — AB (ref 4.0–10.5)

## 2016-11-25 LAB — PREPARE RBC (CROSSMATCH)

## 2016-11-25 LAB — HEMOGLOBIN AND HEMATOCRIT, BLOOD
HCT: 25.1 % — ABNORMAL LOW (ref 39.0–52.0)
Hemoglobin: 7.3 g/dL — ABNORMAL LOW (ref 13.0–17.0)

## 2016-11-25 MED ORDER — SODIUM CHLORIDE 0.9 % IV SOLN: Freq: Once | INTRAVENOUS | Status: DC

## 2016-11-25 MED ORDER — FUROSEMIDE 10 MG/ML IJ SOLN
20.0000 mg | Freq: Two times a day (BID) | INTRAMUSCULAR | Status: AC
  Administered 2016-11-26 – 2016-11-27 (×4): 20 mg via INTRAVENOUS
  Filled 2016-11-25 (×4): qty 2

## 2016-11-25 MED ORDER — TRACE MINERALS CR-CU-MN-SE-ZN 10-1000-500-60 MCG/ML IV SOLN
INTRAVENOUS | Status: AC
  Administered 2016-11-25: 18:00:00 via INTRAVENOUS
  Filled 2016-11-25: qty 2640

## 2016-11-25 MED ORDER — MAGNESIUM SULFATE 2 GM/50ML IV SOLN
2.0000 g | Freq: Once | INTRAVENOUS | Status: AC
  Administered 2016-11-25: 2 g via INTRAVENOUS
  Filled 2016-11-25: qty 50

## 2016-11-25 MED ORDER — FUROSEMIDE 10 MG/ML IJ SOLN
40.0000 mg | Freq: Once | INTRAMUSCULAR | Status: AC
  Administered 2016-11-25: 40 mg via INTRAVENOUS
  Filled 2016-11-25: qty 4
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
## 2016-11-25 NOTE — Progress Notes (Addendum)
Follow up - Trauma and Critical Care  Patient Details:    Chase Ayala is an 26 y.o. male.  Lines/tubes : PICC Triple Lumen 11/19/16 PICC Right Brachial 40 cm 0 cm (Active)  Indication for Insertion or Continuance of Line Prolonged intravenous therapies 11/25/2016  8:00 AM  Exposed Catheter (cm) 0 cm 11/24/2016  8:00 AM  Site Assessment Clean;Dry;Intact 11/25/2016  8:00 AM  Lumen #1 Status Infusing 11/25/2016  8:00 AM  Lumen #2 Status Infusing 11/25/2016  8:00 AM  Lumen #3 Status Infusing;In-line blood sampling system in place 11/25/2016  8:00 AM  Dressing Type Transparent;Occlusive 11/25/2016  8:00 AM  Dressing Status Clean;Dry;Intact;Antimicrobial disc in place 11/25/2016  8:00 AM  Line Care Connections checked and tightened 11/25/2016  8:00 AM  Dressing Change Due 11/26/16 11/25/2016  8:00 AM     Negative Pressure Wound Therapy Abdomen (Active)  Last dressing change 11/23/16 11/23/2016  8:00 AM  Site / Wound Assessment Clean;Dry 11/24/2016  8:00 PM  Peri-wound Assessment Intact 11/24/2016  8:00 PM  Cycle Continuous 11/24/2016  8:00 PM  Target Pressure (mmHg) 125 11/24/2016  8:00 PM  Instillation Volume 475 mL 11/21/2016  3:00 AM  Canister Changed No 11/23/2016  8:00 AM  Dressing Status Intact 11/24/2016  8:00 PM  Drainage Amount Moderate 11/24/2016  8:00 PM  Drainage Description Serous 11/24/2016  8:00 PM  Output (mL) 50 mL 11/24/2016  5:00 PM     NG/OG Tube Nasogastric 18 Fr. Right nare Xray (Active)  External Length of Tube (cm) - (if applicable) 65 cm 11/24/2016  8:00 AM  Site Assessment Clean;Dry;Intact 11/25/2016  8:00 AM  Ongoing Placement Verification No change in respiratory status;No acute changes, not attributed to clinical condition;Xray;No change in cm markings or external length of tube from initial placement 11/25/2016  8:00 AM  Status Suction-low intermittent 11/25/2016  8:00 AM  Amount of suction 100 mmHg 11/24/2016  8:00 PM  Drainage Appearance Green;Yellow 11/24/2016  8:00 PM   Output (mL) 400 mL 11/25/2016  6:00 AM     Urethral Catheter [name], RN Double-lumen 16 Fr. (Active)  Indication for Insertion or Continuance of Catheter Bladder outlet obstruction / other urologic reason 11/25/2016  8:00 AM  Site Assessment Clean;Intact;Dry 11/25/2016  8:00 AM  Catheter Maintenance Bag below level of bladder;No dependent loops;Catheter secured;Drainage bag/tubing not touching floor;Seal intact;Insertion date on drainage bag;Bag emptied prior to transport 11/25/2016  8:00 AM  Collection Container Standard drainage bag 11/25/2016  8:00 AM  Securement Method Securing device (Describe) 11/25/2016  8:00 AM  Urinary Catheter Interventions Unclamped 11/25/2016  8:00 AM  Input (mL) 80 mL 11/18/2016  2:00 PM  Output (mL) 120 mL 11/25/2016 10:00 AM    Microbiology/Sepsis markers: Results for orders placed or performed during the hospital encounter of 11/05/16  Culture, Urine     Status: None   Collection Time: 11/12/16 10:34 AM  Result Value Ref Range Status   Specimen Description URINE, CATHETERIZED  Final   Special Requests NONE  Final   Culture NO GROWTH  Final   Report Status 11/13/2016 FINAL  Final  Culture, respiratory (NON-Expectorated)     Status: Abnormal   Collection Time: 11/12/16 11:11 AM  Result Value Ref Range Status   Specimen Description TRACHEAL ASPIRATE  Final   Special Requests NONE  Final   Gram Stain   Final    RARE SQUAMOUS EPITHELIAL CELLS PRESENT FEW WBC PRESENT,BOTH PMN AND MONONUCLEAR RARE GRAM NEGATIVE RODS RARE GRAM POSITIVE COCCI RARE GRAM NEGATIVE  COCCI    Culture MULTIPLE ORGANISMS PRESENT, NONE PREDOMINANT (A)  Final   Report Status 11/14/2016 FINAL  Final  Culture, blood (routine x 2)     Status: None   Collection Time: 11/12/16 11:18 AM  Result Value Ref Range Status   Specimen Description BLOOD LEFT HAND  Final   Special Requests IN PEDIATRIC BOTTLE Blood Culture adequate volume  Final   Culture NO GROWTH 5 DAYS  Final   Report  Status 11/17/2016 FINAL  Final  Culture, blood (routine x 2)     Status: None   Collection Time: 11/12/16 11:22 AM  Result Value Ref Range Status   Specimen Description BLOOD LEFT ANTECUBITAL  Final   Special Requests IN PEDIATRIC BOTTLE Blood Culture adequate volume  Final   Culture NO GROWTH 5 DAYS  Final   Report Status 11/17/2016 FINAL  Final  Culture, respiratory (NON-Expectorated)     Status: Abnormal   Collection Time: 11/13/16 12:10 PM  Result Value Ref Range Status   Specimen Description TRACHEAL ASPIRATE  Final   Special Requests Normal  Final   Gram Stain   Final    RARE WBC PRESENT,BOTH PMN AND MONONUCLEAR FEW GRAM VARIABLE ROD RARE GRAM POSITIVE COCCI IN PAIRS    Culture MULTIPLE ORGANISMS PRESENT, NONE PREDOMINANT (A)  Final   Report Status 11/15/2016 FINAL  Final  Culture, blood (Routine X 2) w Reflex to ID Panel     Status: None   Collection Time: 11/13/16 12:40 PM  Result Value Ref Range Status   Specimen Description BLOOD RIGHT ANTECUBITAL  Final   Special Requests IN PEDIATRIC BOTTLE Blood Culture adequate volume  Final   Culture NO GROWTH 5 DAYS  Final   Report Status 11/18/2016 FINAL  Final  Culture, blood (Routine X 2) w Reflex to ID Panel     Status: None   Collection Time: 11/13/16 12:40 PM  Result Value Ref Range Status   Specimen Description BLOOD LEFT ANTECUBITAL  Final   Special Requests IN PEDIATRIC BOTTLE Blood Culture adequate volume  Final   Culture NO GROWTH 5 DAYS  Final   Report Status 11/18/2016 FINAL  Final  Culture, Urine     Status: None   Collection Time: 11/13/16  1:56 PM  Result Value Ref Range Status   Specimen Description URINE, CATHETERIZED  Final   Special Requests Normal  Final   Culture NO GROWTH  Final   Report Status 11/14/2016 FINAL  Final  MRSA PCR Screening     Status: Abnormal   Collection Time: 11/14/16  1:43 PM  Result Value Ref Range Status   MRSA by PCR POSITIVE (A) NEGATIVE Final    Comment:        The GeneXpert  MRSA Assay (FDA approved for NASAL specimens only), is one component of a comprehensive MRSA colonization surveillance program. It is not intended to diagnose MRSA infection nor to guide or monitor treatment for MRSA infections. RESULT CALLED TO, READ BACK BY AND VERIFIED WITH: M. Doran Durand [id number] 09.01.2018 N. MORRIS   Aerobic/Anaerobic Culture (surgical/deep wound)     Status: None   Collection Time: 11/18/16  4:11 PM  Result Value Ref Range Status   Specimen Description PERITONEAL  Final   Special Requests POF VANC AND ZOSYN  Final   Gram Stain   Final    FEW WBC PRESENT, PREDOMINANTLY MONONUCLEAR ABUNDANT GRAM NEGATIVE RODS RARE GRAM POSITIVE COCCI IN PAIRS RARE GRAM POSITIVE RODS    Culture  Final    ABUNDANT ESCHERICHIA COLI MIXED ANAEROBIC FLORA PRESENT.  CALL LAB IF FURTHER IID REQUIRED.    Report Status 11/22/2016 FINAL  Final   Organism ID, Bacteria ESCHERICHIA COLI  Final      Susceptibility   Escherichia coli - MIC*    AMPICILLIN 4 SENSITIVE Sensitive     CEFAZOLIN <=4 SENSITIVE Sensitive     CEFEPIME <=1 SENSITIVE Sensitive     CEFTAZIDIME <=1 SENSITIVE Sensitive     CEFTRIAXONE <=1 SENSITIVE Sensitive     CIPROFLOXACIN <=0.25 SENSITIVE Sensitive     GENTAMICIN <=1 SENSITIVE Sensitive     IMIPENEM <=0.25 SENSITIVE Sensitive     TRIMETH/SULFA <=20 SENSITIVE Sensitive     AMPICILLIN/SULBACTAM <=2 SENSITIVE Sensitive     PIP/TAZO <=4 SENSITIVE Sensitive     Extended ESBL NEGATIVE Sensitive     * ABUNDANT ESCHERICHIA COLI  Surgical PCR screen     Status: Abnormal   Collection Time: 11/20/16  5:26 AM  Result Value Ref Range Status   MRSA, PCR POSITIVE (A) NEGATIVE Final    Comment: RESULT CALLED TO, READ BACK BY AND VERIFIED WITH: C. Dupont RN 11:35 11/20/16 ([email or web address])    Staphylococcus aureus POSITIVE (A) NEGATIVE Final    Comment: (NOTE) The Xpert SA Assay (FDA approved for NASAL specimens in patients 62 years of age and older), is one component of a  comprehensive surveillance program. It is not intended to diagnose infection nor to guide or monitor treatment.     Anti-infectives:  Anti-infectives    Start     Dose/Rate Route Frequency Ordered Stop   11/17/16 0000  vancomycin (VANCOCIN) 1,500 mg in sodium chloride 0.9 % 500 mL IVPB  Status:  Discontinued     1,500 mg 250 mL/hr over 120 Minutes Intravenous Every 8 hours 11/16/16 1700 11/19/16 0947   11/15/16 1600  vancomycin (VANCOCIN) IVPB 1000 mg/200 mL premix  Status:  Discontinued     1,000 mg 200 mL/hr over 60 Minutes Intravenous Every 8 hours 11/15/16 1332 11/16/16 1700   11/15/16 0130  vancomycin (VANCOCIN) IVPB 1000 mg/200 mL premix  Status:  Discontinued     1,000 mg 200 mL/hr over 60 Minutes Intravenous Every 8 hours 11/14/16 1628 11/15/16 1332   11/14/16 1730  vancomycin (VANCOCIN) 2,000 mg in sodium chloride 0.9 % 500 mL IVPB     2,000 mg 250 mL/hr over 120 Minutes Intravenous  Once 11/14/16 1628 11/14/16 2042   11/13/16 1400  piperacillin-tazobactam (ZOSYN) IVPB 3.375 g  Status:  Discontinued     3.375 g 100 mL/hr over 30 Minutes Intravenous Every 8 hours 11/13/16 1115 11/13/16 1121   11/12/16 1130  piperacillin-tazobactam (ZOSYN) IVPB 3.375 g  Status:  Discontinued     3.375 g 12.5 mL/hr over 240 Minutes Intravenous Every 8 hours 11/12/16 1038 11/24/16 0943   11/05/16 0600  ceFAZolin (ANCEF) IVPB 2g/100 mL premix     2 g 200 mL/hr over 30 Minutes Intravenous  Once 11/05/16 0559 11/05/16 5284      Best Practice/Protocols:  VTE Prophylaxis: Lovenox (prophylaxtic dose) and Mechanical GI Prophylaxis: Proton Pump Inhibitor Continous Sedation  Consults: Treatment Team:  Donalee Citrin, MD Myrene Galas, MD Serena Colonel, MD    Events:  Subjective:    Overnight Issues: No signficiant changes  Objective:  Vital signs for last 24 hours: Temp:  [98.7 F (37.1 C)-98.9 F (37.2 C)] 98.8 F (37.1 C) (09/12 1115) Pulse Rate:  [68-96] 68 (09/12 1130) Resp:   [0-19]  16 (09/12 1130) BP: (107-137)/(48-72) 125/70 (09/12 1130) SpO2:  [100 %] 100 % (09/12 1130) FiO2 (%):  [40 %] 40 % (09/12 1130) Weight:  [110 kg (242 lb 8.1 oz)] 110 kg (242 lb 8.1 oz) (09/12 0417)  Hemodynamic parameters for last 24 hours:    Intake/Output from previous day: 09/11 0701 - 09/12 0700 In: 4207.8 [I.V.:4207.8] Out: 3450 [Urine:2750; Emesis/NG output:650; Drains:50]  Intake/Output this shift: Total I/O In: 1200.4 [I.V.:567.1; Blood:633.3] Out: 270 [Urine:270]  Vent settings for last 24 hours: Vent Mode: PRVC FiO2 (%):  [40 %] 40 % Set Rate:  [16 bmp] 16 bmp Vt Set:  [560 mL] 560 mL PEEP:  [5 cmH20] 5 cmH20 Plateau Pressure:  [22 cmH20-25 cmH20] 25 cmH20  Physical Exam:  General: no respiratory distress and opens eyes but hard to tell if he is cognizant of what is happening Neuro: nonfocal exam, RASS 0, RASS -1 and weakness left upper extremity Resp: clear to auscultation bilaterally CVS: sinus tachycardia GI: distended, hypoactive BS and NPWD in place.  Not tense, NGT outptu 450 last shift. Extremities: edema 2+, edema 3+, unequal size and RUE more swollen  Results for orders placed or performed during the hospital encounter of 11/05/16 (from the past 24 hour(s))  Glucose, capillary     Status: Abnormal   Collection Time: 11/24/16  3:54 PM  Result Value Ref Range   Glucose-Capillary 117 (H) 65 - 99 mg/dL   Comment 1 Notify RN    Comment 2 Document in Chart   Comprehensive metabolic panel     Status: Abnormal   Collection Time: 11/24/16  7:47 PM  Result Value Ref Range   Sodium 137 135 - 145 mmol/L   Potassium 4.0 3.5 - 5.1 mmol/L   Chloride 109 101 - 111 mmol/L   CO2 25 22 - 32 mmol/L   Glucose, Bld 114 (H) 65 - 99 mg/dL   BUN 6 6 - 20 mg/dL   Creatinine, Ser 9.14 (L) 0.61 - 1.24 mg/dL   Calcium 6.5 (L) 8.9 - 10.3 mg/dL   Total Protein 3.6 (L) 6.5 - 8.1 g/dL   Albumin 1.1 (L) 3.5 - 5.0 g/dL   AST 39 15 - 41 U/L   ALT 28 17 - 63 U/L    Alkaline Phosphatase 61 38 - 126 U/L   Total Bilirubin 1.3 (H) 0.3 - 1.2 mg/dL   GFR calc non Af Amer >60 >60 mL/min   GFR calc Af Amer >60 >60 mL/min   Anion gap 3 (L) 5 - 15  Magnesium     Status: Abnormal   Collection Time: 11/24/16  7:47 PM  Result Value Ref Range   Magnesium 1.4 (L) 1.7 - 2.4 mg/dL  Phosphorus     Status: None   Collection Time: 11/24/16  7:47 PM  Result Value Ref Range   Phosphorus 3.6 2.5 - 4.6 mg/dL  Glucose, capillary     Status: Abnormal   Collection Time: 11/24/16  8:19 PM  Result Value Ref Range   Glucose-Capillary 119 (H) 65 - 99 mg/dL  Glucose, capillary     Status: Abnormal   Collection Time: 11/24/16 11:58 PM  Result Value Ref Range   Glucose-Capillary 115 (H) 65 - 99 mg/dL  CBC     Status: Abnormal   Collection Time: 11/25/16  3:57 AM  Result Value Ref Range   WBC 12.9 (H) 4.0 - 10.5 K/uL   RBC 2.17 (L) 4.22 - 5.81 MIL/uL   Hemoglobin 6.7 (LL)  13.0 - 17.0 g/dL   HCT 16.1 (L) 09.6 - 04.5 %   MCV 95.4 78.0 - 100.0 fL   MCH 30.9 26.0 - 34.0 pg   MCHC 32.4 30.0 - 36.0 g/dL   RDW 40.9 81.1 - 91.4 %   Platelets 373 150 - 400 K/uL  Glucose, capillary     Status: Abnormal   Collection Time: 11/25/16  3:58 AM  Result Value Ref Range   Glucose-Capillary 133 (H) 65 - 99 mg/dL  Type and screen [facility]     Status: None (Preliminary result)   Collection Time: 11/25/16  5:19 AM  Result Value Ref Range   ABO/RH(D) O POS    Antibody Screen NEG    Sample Expiration 11/28/2016    Unit Number [id number]    Blood Component Type RED CELLS,LR    Unit division 00    Status of Unit ISSUED    Transfusion Status OK TO TRANSFUSE    Crossmatch Result Compatible    Unit Number [id number]    Blood Component Type RED CELLS,LR    Unit division 00    Status of Unit ISSUED    Transfusion Status OK TO TRANSFUSE    Crossmatch Result Compatible   Glucose, capillary     Status: Abnormal   Collection Time: 11/25/16  8:18 AM  Result Value  Ref Range   Glucose-Capillary 125 (H) 65 - 99 mg/dL  Glucose, capillary     Status: Abnormal   Collection Time: 11/25/16 11:53 AM  Result Value Ref Range   Glucose-Capillary 113 (H) 65 - 99 mg/dL   Comment 1 Notify RN    Comment 2 Document in Chart      Assessment/Plan:   NEURO  Altered Mental Status:  agitation, delirium and sedation   Plan: Manage to allow weaning on the ventilator  PULM  Atelectasis/collapse (Significant bibasilar atelectasis on CXR yesterday.[name])   Plan: Oxygenation is good, but patient not able to wean on the ventilator, even with tracheostomy  CARDIO  No issues   Plan: CPM  RENAL  Hypervolemia   Plan: Very positive on fluids since admission, will possible respond to Lasix  GI  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   Plan: Still not passing stuff through great.  Will keep NGT on suction.  ID  No active infectious process.   Plan: Not on antibiotics. currently  HEME  Anemia acute blood loss anemia)   Plan: hemoglobin down to 6.7 this AM.  Got two units of blood.  ENDO No known issues.   Plan: CPM  Global Issues  Patient is currently getting TPN for nutrition.  Fluid overloaded and edematous.  Not weaning well from the ventilator.  Will diurese, give blood,     LOS: 20 days   Additional comments:I reviewed the patient's new clinical lab test results. cbc/bmet and I have discussed and reviewed with family members patient's Mother  Critical Care Total Time*: 38 minutes  Milena Liggett 11/25/2016  *Care during the described time interval was provided by me and/or other providers on the critical care team.  I have reviewed this patient's available data, including medical history, events of note, physical examination and test results as part of my evaluation.
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
## 2016-11-26 ENCOUNTER — Inpatient Hospital Stay (HOSPITAL_COMMUNITY): Payer: Self-pay

## 2016-11-26 LAB — CBC WITH DIFFERENTIAL/PLATELET
BASOS PCT: 0 %
Basophils Absolute: 0 10*3/uL (ref 0.0–0.1)
Eosinophils Absolute: 0.2 10*3/uL (ref 0.0–0.7)
Eosinophils Relative: 1 %
HEMATOCRIT: 26.8 % — AB (ref 39.0–52.0)
HEMOGLOBIN: 8.6 g/dL — AB (ref 13.0–17.0)
LYMPHS ABS: 1.4 10*3/uL (ref 0.7–4.0)
LYMPHS PCT: 10 %
MCH: 29.6 pg (ref 26.0–34.0)
MCHC: 32.1 g/dL (ref 30.0–36.0)
MCV: 92.1 fL (ref 78.0–100.0)
MONO ABS: 1.6 10*3/uL — AB (ref 0.1–1.0)
MONOS PCT: 11 %
NEUTROS ABS: 11.1 10*3/uL — AB (ref 1.7–7.7)
NEUTROS PCT: 78 %
Platelets: 402 10*3/uL — ABNORMAL HIGH (ref 150–400)
RBC: 2.91 MIL/uL — ABNORMAL LOW (ref 4.22–5.81)
RDW: 18.1 % — ABNORMAL HIGH (ref 11.5–15.5)
WBC: 14.3 10*3/uL — ABNORMAL HIGH (ref 4.0–10.5)

## 2016-11-26 LAB — COMPREHENSIVE METABOLIC PANEL
ALBUMIN: 1.4 g/dL — AB (ref 3.5–5.0)
ALK PHOS: 83 U/L (ref 38–126)
ALT: 31 U/L (ref 17–63)
ANION GAP: 4 — AB (ref 5–15)
AST: 34 U/L (ref 15–41)
BILIRUBIN TOTAL: 1.9 mg/dL — AB (ref 0.3–1.2)
BUN: 9 mg/dL (ref 6–20)
CALCIUM: 7.8 mg/dL — AB (ref 8.9–10.3)
CO2: 31 mmol/L (ref 22–32)
Chloride: 106 mmol/L (ref 101–111)
Creatinine, Ser: 0.65 mg/dL (ref 0.61–1.24)
GFR calc Af Amer: >60 mL/min (ref >60)
Glucose, Bld: 113 mg/dL — ABNORMAL HIGH (ref 65–99)
POTASSIUM: 3.9 mmol/L (ref 3.5–5.1)
Sodium: 141 mmol/L (ref 135–145)
TOTAL PROTEIN: 5.1 g/dL — AB (ref 6.5–8.1)

## 2016-11-26 LAB — TYPE AND SCREEN
ABO/RH(D): O POS
ANTIBODY SCREEN: NEGATIVE
UNIT DIVISION: 0
UNIT DIVISION: 0

## 2016-11-26 LAB — BPAM RBC
BLOOD PRODUCT EXPIRATION DATE: 201809122359
Blood Product Expiration Date: 201809132359
ISSUE DATE / TIME: 201809120928
ISSUE DATE / TIME: 201809120654
UNIT TYPE AND RH: 9500
Unit Type and Rh: 9500

## 2016-11-26 LAB — GLUCOSE, CAPILLARY
GLUCOSE-CAPILLARY: 115 mg/dL — AB (ref 65–99)
GLUCOSE-CAPILLARY: 128 mg/dL — AB (ref 65–99)
GLUCOSE-CAPILLARY: 134 mg/dL — AB (ref 65–99)
Glucose-Capillary: 130 mg/dL — ABNORMAL HIGH (ref 65–99)
Glucose-Capillary: 122 mg/dL — ABNORMAL HIGH (ref 65–99)
Glucose-Capillary: 124 mg/dL — ABNORMAL HIGH (ref 65–99)

## 2016-11-26 LAB — PHOSPHORUS: PHOSPHORUS: 5.5 mg/dL — AB (ref 2.5–4.6)

## 2016-11-26 LAB — MAGNESIUM: MAGNESIUM: 1.6 mg/dL — AB (ref 1.7–2.4)

## 2016-11-26 MED ORDER — ALTEPLASE 2 MG IJ SOLR
2.0000 mg | Freq: Once | INTRAMUSCULAR | Status: AC
  Administered 2016-11-26: 2 mg
  Filled 2016-11-26: qty 2

## 2016-11-26 MED ORDER — MAGNESIUM SULFATE 2 GM/50ML IV SOLN
2.0000 g | Freq: Once | INTRAVENOUS | Status: AC
  Administered 2016-11-26: 2 g via INTRAVENOUS
  Filled 2016-11-26: qty 50

## 2016-11-26 MED ORDER — TRACE MINERALS CR-CU-MN-SE-ZN 10-1000-500-60 MCG/ML IV SOLN
INTRAVENOUS | Status: AC
  Administered 2016-11-26: 18:00:00 via INTRAVENOUS
  Filled 2016-11-26: qty 3000

## 2016-11-26 MED ORDER — TRACE MINERALS CR-CU-MN-SE-ZN 10-1000-500-60 MCG/ML IV SOLN
INTRAVENOUS | Status: DC
  Filled 2016-11-26: qty 3000
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
## 2016-11-26 NOTE — Progress Notes (Signed)
SLP Cancellation Note  Patient Details Name: Chase Ayala MRN: [id number] DOB: 1990-06-19   Cancelled treatment:       Reason Eval/Treat Not Completed: Patient's level of consciousness. Pt continues to be heavily sedated. Will await lifting of sedation to address cognitive linguistic goals.    [name], Riley NearingBonnie Caroline 11/26/2016, 10:58 AM

## 2016-11-27 ENCOUNTER — Inpatient Hospital Stay (HOSPITAL_COMMUNITY): Payer: Self-pay

## 2016-11-27 DIAGNOSIS — M7989 Other specified soft tissue disorders: Secondary | ICD-10-CM

## 2016-11-27 LAB — CBC
HEMATOCRIT: 27.1 % — AB (ref 39.0–52.0)
Hemoglobin: 8.6 g/dL — ABNORMAL LOW (ref 13.0–17.0)
MCH: 29.7 pg (ref 26.0–34.0)
MCHC: 31.7 g/dL (ref 30.0–36.0)
MCV: 93.4 fL (ref 78.0–100.0)
Platelets: 438 10*3/uL — ABNORMAL HIGH (ref 150–400)
RBC: 2.9 MIL/uL — AB (ref 4.22–5.81)
RDW: 17.3 % — AB (ref 11.5–15.5)
WBC: 15.1 10*3/uL — ABNORMAL HIGH (ref 4.0–10.5)

## 2016-11-27 LAB — PHOSPHORUS
PHOSPHORUS: 5.5 mg/dL — AB (ref 2.5–4.6)
Phosphorus: 4.4 mg/dL (ref 2.5–4.6)

## 2016-11-27 LAB — BASIC METABOLIC PANEL
ANION GAP: 4 — AB (ref 5–15)
Anion gap: 6 (ref 5–15)
BUN: 10 mg/dL (ref 6–20)
BUN: 13 mg/dL (ref 6–20)
CALCIUM: 8.1 mg/dL — AB (ref 8.9–10.3)
CO2: 27 mmol/L (ref 22–32)
CO2: 33 mmol/L — ABNORMAL HIGH (ref 22–32)
Calcium: 6.7 mg/dL — ABNORMAL LOW (ref 8.9–10.3)
Chloride: 97 mmol/L — ABNORMAL LOW (ref 101–111)
Chloride: 98 mmol/L — ABNORMAL LOW (ref 101–111)
Creatinine, Ser: 0.62 mg/dL (ref 0.61–1.24)
Creatinine, Ser: 0.64 mg/dL (ref 0.61–1.24)
GFR calc Af Amer: >60 mL/min (ref >60)
GLUCOSE: 847 mg/dL — AB (ref 65–99)
Glucose, Bld: 118 mg/dL — ABNORMAL HIGH (ref 65–99)
POTASSIUM: 3.2 mmol/L — AB (ref 3.5–5.1)
Potassium: 3.6 mmol/L (ref 3.5–5.1)
SODIUM: 137 mmol/L (ref 135–145)
Sodium: 128 mmol/L — ABNORMAL LOW (ref 135–145)

## 2016-11-27 LAB — GLUCOSE, CAPILLARY
GLUCOSE-CAPILLARY: 111 mg/dL — AB (ref 65–99)
GLUCOSE-CAPILLARY: 120 mg/dL — AB (ref 65–99)
Glucose-Capillary: 127 mg/dL — ABNORMAL HIGH (ref 65–99)
Glucose-Capillary: 115 mg/dL — ABNORMAL HIGH (ref 65–99)
Glucose-Capillary: 126 mg/dL — ABNORMAL HIGH (ref 65–99)

## 2016-11-27 LAB — MAGNESIUM
Magnesium: 1.2 mg/dL — ABNORMAL LOW (ref 1.7–2.4)
Magnesium: 1.3 mg/dL — ABNORMAL LOW (ref 1.7–2.4)

## 2016-11-27 MED ORDER — POTASSIUM CHLORIDE 10 MEQ/50ML IV SOLN
10.0000 meq | INTRAVENOUS | Status: AC
  Administered 2016-11-27 (×4): 10 meq via INTRAVENOUS
  Filled 2016-11-27 (×4): qty 50

## 2016-11-27 MED ORDER — METOCLOPRAMIDE HCL 5 MG/ML IJ SOLN: 5.0000 mg | Freq: Three times a day (TID) | INTRAMUSCULAR | Status: AC | PRN

## 2016-11-27 MED ORDER — TRACE MINERALS CR-CU-MN-SE-ZN 10-1000-500-60 MCG/ML IV SOLN
INTRAVENOUS | Status: DC
  Filled 2016-11-27: qty 3000

## 2016-11-27 MED ORDER — MAGNESIUM SULFATE 2 GM/50ML IV SOLN: 2.0000 g | Freq: Once | INTRAVENOUS | Status: DC

## 2016-11-27 MED ORDER — TRACE MINERALS CR-CU-MN-SE-ZN 10-1000-500-60 MCG/ML IV SOLN
INTRAVENOUS | Status: AC
  Administered 2016-11-27: 17:00:00 via INTRAVENOUS
  Filled 2016-11-27: qty 3000

## 2016-11-27 MED ORDER — MAGNESIUM SULFATE 2 GM/50ML IV SOLN
2.0000 g | Freq: Once | INTRAVENOUS | Status: AC
  Administered 2016-11-27: 2 g via INTRAVENOUS
  Filled 2016-11-27: qty 50

## 2016-11-27 NOTE — Progress Notes (Signed)
Follow up - Trauma and Critical Care  Patient Details:    Chase Ayala is an 26 y.o. male.  Lines/tubes : PICC Triple Lumen 11/19/16 PICC Right Brachial 40 cm 0 cm (Active)  Indication for Insertion or Continuance of Line Prolonged intravenous therapies 11/27/2016  8:00 AM  Exposed Catheter (cm) 0 cm 11/24/2016  8:00 AM  Site Assessment Clean;Dry;Intact 11/27/2016  8:00 AM  Lumen #1 Status Infusing 11/27/2016  8:00 AM  Lumen #2 Status Infusing 11/27/2016  8:00 AM  Lumen #3 Status Infusing;In-line blood sampling system in place 11/27/2016  8:00 AM  Dressing Type Transparent;Occlusive 11/27/2016  8:00 AM  Dressing Status Clean;Dry;Intact;Antimicrobial disc in place 11/27/2016  8:00 AM  Line Care Lumen 2 tubing changed;Connections checked and tightened 11/26/2016 11:00 PM  Dressing Intervention New dressing;Dressing changed;Antimicrobial disc changed 11/26/2016 11:00 PM  Dressing Change Due 12/03/16 11/27/2016  8:00 AM     Negative Pressure Wound Therapy Abdomen (Active)  Last dressing change 11/26/16 11/27/2016  8:00 AM  Site / Wound Assessment Clean;Dry 11/27/2016  8:00 AM  Peri-wound Assessment Intact 11/27/2016  8:00 AM  Cycle Continuous 11/27/2016  8:00 AM  Target Pressure (mmHg) 125 11/27/2016  8:00 AM  Instillation Volume 475 mL 11/21/2016  3:00 AM  Canister Changed Yes 11/26/2016 11:42 AM  Dressing Status Intact 11/27/2016  8:00 AM  Drainage Amount Minimal 11/27/2016  8:00 AM  Drainage Description Serous 11/27/2016  8:00 AM  Output (mL) 75 mL 11/25/2016  7:00 PM     NG/OG Tube Nasogastric 16 Fr. Right nare Xray Measured external length of tube (Active)  Site Assessment Clean;Dry;Intact 11/27/2016  8:00 AM  Ongoing Placement Verification No change in cm markings or external length of tube from initial placement;No acute changes, not attributed to clinical condition;No change in respiratory status;Xray 11/27/2016  8:00 AM  Status Suction-low intermittent 11/27/2016  8:00 AM  Drainage Appearance  Bile 11/27/2016  8:00 AM  Output (mL) 1000 mL 11/26/2016  7:00 PM     Urethral Catheter Carilyn Goodpasture, RN Double-lumen 16 Fr. (Active)  Indication for Insertion or Continuance of Catheter Acute urinary retention;Bladder outlet obstruction / other urologic reason 11/27/2016  8:00 AM  Site Assessment Clean;Intact;Dry 11/27/2016  8:00 AM  Catheter Maintenance Bag below level of bladder;Catheter secured;Drainage bag/tubing not touching floor;Insertion date on drainage bag;No dependent loops;Seal intact 11/27/2016  8:00 AM  Collection Container Standard drainage bag 11/27/2016  8:00 AM  Securement Method Securing device (Describe) 11/27/2016  8:00 AM  Urinary Catheter Interventions Unclamped 11/27/2016  8:00 AM  Input (mL) 80 mL 11/18/2016  2:00 PM  Output (mL) 225 mL 11/27/2016 10:00 AM    Microbiology/Sepsis markers: Results for orders placed or performed during the hospital encounter of 11/05/16  Culture, Urine     Status: None   Collection Time: 11/12/16 10:34 AM  Result Value Ref Range Status   Specimen Description URINE, CATHETERIZED  Final   Special Requests NONE  Final   Culture NO GROWTH  Final   Report Status 11/13/2016 FINAL  Final  Culture, respiratory (NON-Expectorated)     Status: Abnormal   Collection Time: 11/12/16 11:11 AM  Result Value Ref Range Status   Specimen Description TRACHEAL ASPIRATE  Final   Special Requests NONE  Final   Gram Stain   Final    RARE SQUAMOUS EPITHELIAL CELLS PRESENT FEW WBC PRESENT,BOTH PMN AND MONONUCLEAR RARE GRAM NEGATIVE RODS RARE GRAM POSITIVE COCCI RARE GRAM NEGATIVE COCCI    Culture MULTIPLE ORGANISMS PRESENT, NONE PREDOMINANT (  A)  Final   Report Status 11/14/2016 FINAL  Final  Culture, blood (routine x 2)     Status: None   Collection Time: 11/12/16 11:18 AM  Result Value Ref Range Status   Specimen Description BLOOD LEFT HAND  Final   Special Requests IN PEDIATRIC BOTTLE Blood Culture adequate volume  Final   Culture NO GROWTH 5 DAYS   Final   Report Status 11/17/2016 FINAL  Final  Culture, blood (routine x 2)     Status: None   Collection Time: 11/12/16 11:22 AM  Result Value Ref Range Status   Specimen Description BLOOD LEFT ANTECUBITAL  Final   Special Requests IN PEDIATRIC BOTTLE Blood Culture adequate volume  Final   Culture NO GROWTH 5 DAYS  Final   Report Status 11/17/2016 FINAL  Final  Culture, respiratory (NON-Expectorated)     Status: Abnormal   Collection Time: 11/13/16 12:10 PM  Result Value Ref Range Status   Specimen Description TRACHEAL ASPIRATE  Final   Special Requests Normal  Final   Gram Stain   Final    RARE WBC PRESENT,BOTH PMN AND MONONUCLEAR FEW GRAM VARIABLE ROD RARE GRAM POSITIVE COCCI IN PAIRS    Culture MULTIPLE ORGANISMS PRESENT, NONE PREDOMINANT (A)  Final   Report Status 11/15/2016 FINAL  Final  Culture, blood (Routine X 2) w Reflex to ID Panel     Status: None   Collection Time: 11/13/16 12:40 PM  Result Value Ref Range Status   Specimen Description BLOOD RIGHT ANTECUBITAL  Final   Special Requests IN PEDIATRIC BOTTLE Blood Culture adequate volume  Final   Culture NO GROWTH 5 DAYS  Final   Report Status 11/18/2016 FINAL  Final  Culture, blood (Routine X 2) w Reflex to ID Panel     Status: None   Collection Time: 11/13/16 12:40 PM  Result Value Ref Range Status   Specimen Description BLOOD LEFT ANTECUBITAL  Final   Special Requests IN PEDIATRIC BOTTLE Blood Culture adequate volume  Final   Culture NO GROWTH 5 DAYS  Final   Report Status 11/18/2016 FINAL  Final  Culture, Urine     Status: None   Collection Time: 11/13/16  1:56 PM  Result Value Ref Range Status   Specimen Description URINE, CATHETERIZED  Final   Special Requests Normal  Final   Culture NO GROWTH  Final   Report Status 11/14/2016 FINAL  Final  MRSA PCR Screening     Status: Abnormal   Collection Time: 11/14/16  1:43 PM  Result Value Ref Range Status   MRSA by PCR POSITIVE (A) NEGATIVE Final    Comment:         The GeneXpert MRSA Assay (FDA approved for NASAL specimens only), is one component of a comprehensive MRSA colonization surveillance program. It is not intended to diagnose MRSA infection nor to guide or monitor treatment for MRSA infections. RESULT CALLED TO, READ BACK BY AND VERIFIED WITH: M. Doran Durand [id number] 09.01.2018 N. MORRIS   Aerobic/Anaerobic Culture (surgical/deep wound)     Status: None   Collection Time: 11/18/16  4:11 PM  Result Value Ref Range Status   Specimen Description PERITONEAL  Final   Special Requests POF VANC AND ZOSYN  Final   Gram Stain   Final    FEW WBC PRESENT, PREDOMINANTLY MONONUCLEAR ABUNDANT GRAM NEGATIVE RODS RARE GRAM POSITIVE COCCI IN PAIRS RARE GRAM POSITIVE RODS    Culture   Final    ABUNDANT ESCHERICHIA COLI MIXED ANAEROBIC FLORA  PRESENT.  CALL LAB IF FURTHER IID REQUIRED.    Report Status 11/22/2016 FINAL  Final   Organism ID, Bacteria ESCHERICHIA COLI  Final      Susceptibility   Escherichia coli - MIC*    AMPICILLIN 4 SENSITIVE Sensitive     CEFAZOLIN <=4 SENSITIVE Sensitive     CEFEPIME <=1 SENSITIVE Sensitive     CEFTAZIDIME <=1 SENSITIVE Sensitive     CEFTRIAXONE <=1 SENSITIVE Sensitive     CIPROFLOXACIN <=0.25 SENSITIVE Sensitive     GENTAMICIN <=1 SENSITIVE Sensitive     IMIPENEM <=0.25 SENSITIVE Sensitive     TRIMETH/SULFA <=20 SENSITIVE Sensitive     AMPICILLIN/SULBACTAM <=2 SENSITIVE Sensitive     PIP/TAZO <=4 SENSITIVE Sensitive     Extended ESBL NEGATIVE Sensitive     * ABUNDANT ESCHERICHIA COLI  Surgical PCR screen     Status: Abnormal   Collection Time: 11/20/16  5:26 AM  Result Value Ref Range Status   MRSA, PCR POSITIVE (A) NEGATIVE Final    Comment: RESULT CALLED TO, READ BACK BY AND VERIFIED WITH: C. Dupont RN 11:35 11/20/16 ([email or web address])    Staphylococcus aureus POSITIVE (A) NEGATIVE Final    Comment: (NOTE) The Xpert SA Assay (FDA approved for NASAL specimens in patients 20 years of age and older), is one  component of a comprehensive surveillance program. It is not intended to diagnose infection nor to guide or monitor treatment.     Anti-infectives:  Anti-infectives    Start     Dose/Rate Route Frequency Ordered Stop   11/17/16 0000  vancomycin (VANCOCIN) 1,500 mg in sodium chloride 0.9 % 500 mL IVPB  Status:  Discontinued     1,500 mg 250 mL/hr over 120 Minutes Intravenous Every 8 hours 11/16/16 1700 11/19/16 0947   11/15/16 1600  vancomycin (VANCOCIN) IVPB 1000 mg/200 mL premix  Status:  Discontinued     1,000 mg 200 mL/hr over 60 Minutes Intravenous Every 8 hours 11/15/16 1332 11/16/16 1700   11/15/16 0130  vancomycin (VANCOCIN) IVPB 1000 mg/200 mL premix  Status:  Discontinued     1,000 mg 200 mL/hr over 60 Minutes Intravenous Every 8 hours 11/14/16 1628 11/15/16 1332   11/14/16 1730  vancomycin (VANCOCIN) 2,000 mg in sodium chloride 0.9 % 500 mL IVPB     2,000 mg 250 mL/hr over 120 Minutes Intravenous  Once 11/14/16 1628 11/14/16 2042   11/13/16 1400  piperacillin-tazobactam (ZOSYN) IVPB 3.375 g  Status:  Discontinued     3.375 g 100 mL/hr over 30 Minutes Intravenous Every 8 hours 11/13/16 1115 11/13/16 1121   11/12/16 1130  piperacillin-tazobactam (ZOSYN) IVPB 3.375 g  Status:  Discontinued     3.375 g 12.5 mL/hr over 240 Minutes Intravenous Every 8 hours 11/12/16 1038 11/24/16 0943   11/05/16 0600  ceFAZolin (ANCEF) IVPB 2g/100 mL premix     2 g 200 mL/hr over 30 Minutes Intravenous  Once 11/05/16 0559 11/05/16 0729      Best Practice/Protocols:  VTE Prophylaxis: Lovenox (prophylaxtic dose) and Mechanical GI Prophylaxis: Proton Pump Inhibitor Continous Sedation Still on a lot of sedation.  Consults: Treatment Team:  Donalee Citrin, MD Myrene Galas, MD Serena Colonel, MD    Events:  Subjective:    Overnight Issues: Gut still not working  Objective:  Vital signs for last 24 hours: Temp:  [99.8 F (37.7 C)-100.9 F (38.3 C)] 100.9 F (38.3 C) (09/14  0800) Pulse Rate:  [54-100] 88 (09/14 1000) Resp:  [0-32] 16 (09/14 1000)  BP: (131-150)/(68-98) 150/97 (09/14 1000) SpO2:  [99 %-100 %] 100 % (09/14 1000) FiO2 (%):  [40 %] 40 % (09/14 0826) Weight:  [105.4 kg (232 lb 5.8 oz)] 105.4 kg (232 lb 5.8 oz) (09/14 0500)  Hemodynamic parameters for last 24 hours:    Intake/Output from previous day: 09/13 0701 - 09/14 0700 In: 3781.8 [I.V.:3781.8] Out: 9050 [Urine:8050; Emesis/NG output:1000]  Intake/Output this shift: Total I/O In: 2352 [I.V.:2352] Out: 450 [Urine:450]  Vent settings for last 24 hours: Vent Mode: PRVC FiO2 (%):  [40 %] 40 % Set Rate:  [16 bmp] 16 bmp Vt Set:  [560 mL] 560 mL PEEP:  [5 cmH20] 5 cmH20 Plateau Pressure:  [22 cmH20-29 cmH20] 29 cmH20  Physical Exam:  General: alert and no respiratory distress Neuro: alert, nonfocal exam and will follow commands Resp: clear to auscultation bilaterally GI: Firm abdomen, but not tense like before Extremities: edema 2+ and edemal has improved with diuresis  Results for orders placed or performed during the hospital encounter of 11/05/16 (from the past 24 hour(s))  Glucose, capillary     Status: Abnormal   Collection Time: 11/26/16 11:26 AM  Result Value Ref Range   Glucose-Capillary 115 (H) 65 - 99 mg/dL  Glucose, capillary     Status: Abnormal   Collection Time: 11/26/16  4:03 PM  Result Value Ref Range   Glucose-Capillary 122 (H) 65 - 99 mg/dL  Glucose, capillary     Status: Abnormal   Collection Time: 11/26/16  8:16 PM  Result Value Ref Range   Glucose-Capillary 124 (H) 65 - 99 mg/dL  Glucose, capillary     Status: Abnormal   Collection Time: 11/26/16 11:50 PM  Result Value Ref Range   Glucose-Capillary 134 (H) 65 - 99 mg/dL  Glucose, capillary     Status: Abnormal   Collection Time: 11/27/16  3:36 AM  Result Value Ref Range   Glucose-Capillary 127 (H) 65 - 99 mg/dL   Comment 1 Call MD NNP PA CNM    Comment 2 Document in Chart   CBC     Status: Abnormal    Collection Time: 11/27/16  7:43 AM  Result Value Ref Range   WBC 15.1 (H) 4.0 - 10.5 K/uL   RBC 2.90 (L) 4.22 - 5.81 MIL/uL   Hemoglobin 8.6 (L) 13.0 - 17.0 g/dL   HCT 95.6 (L) 21.3 - 08.6 %   MCV 93.4 78.0 - 100.0 fL   MCH 29.7 26.0 - 34.0 pg   MCHC 31.7 30.0 - 36.0 g/dL   RDW 57.8 (H) 46.9 - 62.9 %   Platelets 438 (H) 150 - 400 K/uL  Glucose, capillary     Status: Abnormal   Collection Time: 11/27/16  8:11 AM  Result Value Ref Range   Glucose-Capillary 115 (H) 65 - 99 mg/dL     Assessment/Plan:   NEURO  Altered Mental Status:  agitation, delirium, pain and sedation   Plan: Adjust medication as tolerated.  PULM  Atelectasis/collapse (bibasilar)   Plan: Will try to wean to trach collar  CARDIO  Ventricular Premature Beats (PVCs without comprommise)   Plan: Check electrolytes  RENAL  Has diurresed very well with Lasix over the last two days   Plan: Has one additional dose of lasix  GI  s/p Exploratory Laparotomy and abdominal compartment syndrome with SB ischemia Ileus   Plan: CPM with NGT decompression.  Added Reglan  ID  Currently not treating any infectious sources   Plan: CPM  HEME  Hemoglobin is okay.  WBC has gon up a little.   Plan: CPM  ENDO No known problems   Plan: CPM  Global Issues  May be difficult to wean the patient with his abdomen so tight still, but I do not believe that he continues to have a compartment syndrome.  His abdome does not seem to hurt very much.  Will add Reglan, SPM otherwise.    LOS: 22 days   Additional comments:I reviewed the patient's new clinical lab test results. cbc/bmet and I reviewed the patients new imaging test results. cxr  Critical Care Total Time*: 38 Minutes   Janequa Kipnis 11/27/2016  *Care during the described time interval was provided by me and/or other providers on the critical care team.  I have reviewed this patient's available data, including medical history, events of note, physical examination and test  results as part of my evaluation.

## 2016-11-27 NOTE — Progress Notes (Signed)
RT note: RT changed patients trach With RT Sharene Skeans per order. Hyper oxygenated patient pre procedure.Patient tolerated well, posiive easy cap detected and trach care completed.

## 2016-11-27 NOTE — Progress Notes (Signed)
BMP drawn three separate times thinking the glucose was reading incorrectly. Last attempt >847. MD paged. Still thinking it is r/t where blood was drawn. Will reorder BMP but stick patient in different area. Chastidy Ranker, [place] Scrape, RN
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
## 2016-11-27 NOTE — Progress Notes (Signed)
*  PRELIMINARY RESULTS* Vascular Ultrasound Right upper extremity venous duplex has been completed.  Preliminary findings: visualized veins of the right upper extremity appear negative for deep vein thrombosis.  The right cephalic vein appears to be positive for superficial vein thrombosis throughout.   Chase Ayala 11/27/2016, 2:00 PM

## 2016-11-28 ENCOUNTER — Encounter (HOSPITAL_COMMUNITY): Payer: Self-pay

## 2016-11-28 LAB — GLUCOSE, CAPILLARY
GLUCOSE-CAPILLARY: 110 mg/dL — AB (ref 65–99)
GLUCOSE-CAPILLARY: 113 mg/dL — AB (ref 65–99)
GLUCOSE-CAPILLARY: 124 mg/dL — AB (ref 65–99)
GLUCOSE-CAPILLARY: 129 mg/dL — AB (ref 65–99)
GLUCOSE-CAPILLARY: 99 mg/dL (ref 65–99)
Glucose-Capillary: 114 mg/dL — ABNORMAL HIGH (ref 65–99)
Glucose-Capillary: 123 mg/dL — ABNORMAL HIGH (ref 65–99)

## 2016-11-28 LAB — CBC WITH DIFFERENTIAL/PLATELET
Basophils Absolute: 0 10*3/uL (ref 0.0–0.1)
Basophils Relative: 0 %
EOS ABS: 0.2 10*3/uL (ref 0.0–0.7)
Eosinophils Relative: 1 %
HEMATOCRIT: 25.8 % — AB (ref 39.0–52.0)
Hemoglobin: 8.5 g/dL — ABNORMAL LOW (ref 13.0–17.0)
LYMPHS ABS: 1.7 10*3/uL (ref 0.7–4.0)
Lymphocytes Relative: 11 %
MCH: 29.9 pg (ref 26.0–34.0)
MCHC: 32.9 g/dL (ref 30.0–36.0)
MCV: 90.8 fL (ref 78.0–100.0)
MONOS PCT: 9 %
Monocytes Absolute: 1.4 10*3/uL — ABNORMAL HIGH (ref 0.1–1.0)
NEUTROS ABS: 12 10*3/uL — AB (ref 1.7–7.7)
Neutrophils Relative %: 79 %
PLATELETS: 453 10*3/uL — AB (ref 150–400)
RBC: 2.84 MIL/uL — AB (ref 4.22–5.81)
RDW: 16.4 % — AB (ref 11.5–15.5)
WBC: 15.3 10*3/uL — AB (ref 4.0–10.5)

## 2016-11-28 LAB — BASIC METABOLIC PANEL
ANION GAP: 5 (ref 5–15)
BUN: 13 mg/dL (ref 6–20)
CHLORIDE: 99 mmol/L — AB (ref 101–111)
CO2: 30 mmol/L (ref 22–32)
Calcium: 7.9 mg/dL — ABNORMAL LOW (ref 8.9–10.3)
Creatinine, Ser: 0.64 mg/dL (ref 0.61–1.24)
Glucose, Bld: 107 mg/dL — ABNORMAL HIGH (ref 65–99)
POTASSIUM: 4.2 mmol/L (ref 3.5–5.1)
SODIUM: 134 mmol/L — AB (ref 135–145)

## 2016-11-28 LAB — PHOSPHORUS: PHOSPHORUS: 5.3 mg/dL — AB (ref 2.5–4.6)

## 2016-11-28 LAB — MAGNESIUM: MAGNESIUM: 1.6 mg/dL — AB (ref 1.7–2.4)

## 2016-11-28 MED ORDER — ALTEPLASE 2 MG IJ SOLR
2.0000 mg | Freq: Once | INTRAMUSCULAR | Status: DC
Start: 2016-11-28 — End: 2016-12-03

## 2016-11-28 MED ORDER — TRACE MINERALS CR-CU-MN-SE-ZN 10-1000-500-60 MCG/ML IV SOLN
INTRAVENOUS | Status: AC
  Administered 2016-11-28: 17:00:00 via INTRAVENOUS
  Filled 2016-11-28: qty 3000

## 2016-11-28 MED ORDER — MAGNESIUM SULFATE 2 GM/50ML IV SOLN
2.0000 g | Freq: Once | INTRAVENOUS | Status: AC
  Administered 2016-11-28: 2 g via INTRAVENOUS
  Filled 2016-11-28: qty 50
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
## 2016-11-28 NOTE — Progress Notes (Addendum)
Follow up - Trauma and Critical Care  Patient Details:    Chase Ayala is an 26 y.o. male. No acute overnight events  Lines/tubes : PICC Triple Lumen 11/19/16 PICC Right Brachial 40 cm 0 cm (Active)  Indication for Insertion or Continuance of Line Prolonged intravenous therapies 11/27/2016  8:00 AM  Exposed Catheter (cm) 0 cm 11/24/2016  8:00 AM  Site Assessment Clean;Dry;Intact 11/27/2016  8:00 AM  Lumen #1 Status Infusing 11/27/2016  8:00 AM  Lumen #2 Status Infusing 11/27/2016  8:00 AM  Lumen #3 Status Infusing;In-line blood sampling system in place 11/27/2016  8:00 AM  Dressing Type Transparent;Occlusive 11/27/2016  8:00 AM  Dressing Status Clean;Dry;Intact;Antimicrobial disc in place 11/27/2016  8:00 AM  Line Care Lumen 2 tubing changed;Connections checked and tightened 11/26/2016 11:00 PM  Dressing Intervention New dressing;Dressing changed;Antimicrobial disc changed 11/26/2016 11:00 PM  Dressing Change Due 12/03/16 11/27/2016  8:00 AM     Negative Pressure Wound Therapy Abdomen (Active)  Last dressing change 11/26/16 11/27/2016  8:00 AM  Site / Wound Assessment Clean;Dry 11/27/2016  8:00 AM  Peri-wound Assessment Intact 11/27/2016  8:00 AM  Cycle Continuous 11/27/2016  8:00 AM  Target Pressure (mmHg) 125 11/27/2016  8:00 AM  Instillation Volume 475 mL 11/21/2016  3:00 AM  Canister Changed Yes 11/26/2016 11:42 AM  Dressing Status Intact 11/27/2016  8:00 AM  Drainage Amount Minimal 11/27/2016  8:00 AM  Drainage Description Serous 11/27/2016  8:00 AM  Output (mL) 75 mL 11/25/2016  7:00 PM     NG/OG Tube Nasogastric 16 Fr. Right nare Xray Measured external length of tube (Active)  Site Assessment Clean;Dry;Intact 11/27/2016  8:00 AM  Ongoing Placement Verification No change in cm markings or external length of tube from initial placement;No acute changes, not attributed to clinical condition;No change in respiratory status;Xray 11/27/2016  8:00 AM  Status Suction-low intermittent 11/27/2016  8:00  AM  Drainage Appearance Bile 11/27/2016  8:00 AM  Output (mL) 1000 mL 11/26/2016  7:00 PM     Urethral Catheter Carilyn Goodpasture, RN Double-lumen 16 Fr. (Active)  Indication for Insertion or Continuance of Catheter Acute urinary retention;Bladder outlet obstruction / other urologic reason 11/27/2016  8:00 AM  Site Assessment Clean;Intact;Dry 11/27/2016  8:00 AM  Catheter Maintenance Bag below level of bladder;Catheter secured;Drainage bag/tubing not touching floor;Insertion date on drainage bag;No dependent loops;Seal intact 11/27/2016  8:00 AM  Collection Container Standard drainage bag 11/27/2016  8:00 AM  Securement Method Securing device (Describe) 11/27/2016  8:00 AM  Urinary Catheter Interventions Unclamped 11/27/2016  8:00 AM  Input (mL) 80 mL 11/18/2016  2:00 PM  Output (mL) 225 mL 11/27/2016 10:00 AM    Microbiology/Sepsis markers: Results for orders placed or performed during the hospital encounter of 11/05/16  Culture, Urine     Status: None   Collection Time: 11/12/16 10:34 AM  Result Value Ref Range Status   Specimen Description URINE, CATHETERIZED  Final   Special Requests NONE  Final   Culture NO GROWTH  Final   Report Status 11/13/2016 FINAL  Final  Culture, respiratory (NON-Expectorated)     Status: Abnormal   Collection Time: 11/12/16 11:11 AM  Result Value Ref Range Status   Specimen Description TRACHEAL ASPIRATE  Final   Special Requests NONE  Final   Gram Stain   Final    RARE SQUAMOUS EPITHELIAL CELLS PRESENT FEW WBC PRESENT,BOTH PMN AND MONONUCLEAR RARE GRAM NEGATIVE RODS RARE GRAM POSITIVE COCCI RARE GRAM NEGATIVE COCCI    Culture MULTIPLE  ORGANISMS PRESENT, NONE PREDOMINANT (A)  Final   Report Status 11/14/2016 FINAL  Final  Culture, blood (routine x 2)     Status: None   Collection Time: 11/12/16 11:18 AM  Result Value Ref Range Status   Specimen Description BLOOD LEFT HAND  Final   Special Requests IN PEDIATRIC BOTTLE Blood Culture adequate volume  Final    Culture NO GROWTH 5 DAYS  Final   Report Status 11/17/2016 FINAL  Final  Culture, blood (routine x 2)     Status: None   Collection Time: 11/12/16 11:22 AM  Result Value Ref Range Status   Specimen Description BLOOD LEFT ANTECUBITAL  Final   Special Requests IN PEDIATRIC BOTTLE Blood Culture adequate volume  Final   Culture NO GROWTH 5 DAYS  Final   Report Status 11/17/2016 FINAL  Final  Culture, respiratory (NON-Expectorated)     Status: Abnormal   Collection Time: 11/13/16 12:10 PM  Result Value Ref Range Status   Specimen Description TRACHEAL ASPIRATE  Final   Special Requests Normal  Final   Gram Stain   Final    RARE WBC PRESENT,BOTH PMN AND MONONUCLEAR FEW GRAM VARIABLE ROD RARE GRAM POSITIVE COCCI IN PAIRS    Culture MULTIPLE ORGANISMS PRESENT, NONE PREDOMINANT (A)  Final   Report Status 11/15/2016 FINAL  Final  Culture, blood (Routine X 2) w Reflex to ID Panel     Status: None   Collection Time: 11/13/16 12:40 PM  Result Value Ref Range Status   Specimen Description BLOOD RIGHT ANTECUBITAL  Final   Special Requests IN PEDIATRIC BOTTLE Blood Culture adequate volume  Final   Culture NO GROWTH 5 DAYS  Final   Report Status 11/18/2016 FINAL  Final  Culture, blood (Routine X 2) w Reflex to ID Panel     Status: None   Collection Time: 11/13/16 12:40 PM  Result Value Ref Range Status   Specimen Description BLOOD LEFT ANTECUBITAL  Final   Special Requests IN PEDIATRIC BOTTLE Blood Culture adequate volume  Final   Culture NO GROWTH 5 DAYS  Final   Report Status 11/18/2016 FINAL  Final  Culture, Urine     Status: None   Collection Time: 11/13/16  1:56 PM  Result Value Ref Range Status   Specimen Description URINE, CATHETERIZED  Final   Special Requests Normal  Final   Culture NO GROWTH  Final   Report Status 11/14/2016 FINAL  Final  MRSA PCR Screening     Status: Abnormal   Collection Time: 11/14/16  1:43 PM  Result Value Ref Range Status   MRSA by PCR POSITIVE (A) NEGATIVE  Final    Comment:        The GeneXpert MRSA Assay (FDA approved for NASAL specimens only), is one component of a comprehensive MRSA colonization surveillance program. It is not intended to diagnose MRSA infection nor to guide or monitor treatment for MRSA infections. RESULT CALLED TO, READ BACK BY AND VERIFIED WITH: M. Doran Durand [id number] 09.01.2018 N. MORRIS   Aerobic/Anaerobic Culture (surgical/deep wound)     Status: None   Collection Time: 11/18/16  4:11 PM  Result Value Ref Range Status   Specimen Description PERITONEAL  Final   Special Requests POF VANC AND ZOSYN  Final   Gram Stain   Final    FEW WBC PRESENT, PREDOMINANTLY MONONUCLEAR ABUNDANT GRAM NEGATIVE RODS RARE GRAM POSITIVE COCCI IN PAIRS RARE GRAM POSITIVE RODS    Culture   Final    ABUNDANT ESCHERICHIA  COLI MIXED ANAEROBIC FLORA PRESENT.  CALL LAB IF FURTHER IID REQUIRED.    Report Status 11/22/2016 FINAL  Final   Organism ID, Bacteria ESCHERICHIA COLI  Final      Susceptibility   Escherichia coli - MIC*    AMPICILLIN 4 SENSITIVE Sensitive     CEFAZOLIN <=4 SENSITIVE Sensitive     CEFEPIME <=1 SENSITIVE Sensitive     CEFTAZIDIME <=1 SENSITIVE Sensitive     CEFTRIAXONE <=1 SENSITIVE Sensitive     CIPROFLOXACIN <=0.25 SENSITIVE Sensitive     GENTAMICIN <=1 SENSITIVE Sensitive     IMIPENEM <=0.25 SENSITIVE Sensitive     TRIMETH/SULFA <=20 SENSITIVE Sensitive     AMPICILLIN/SULBACTAM <=2 SENSITIVE Sensitive     PIP/TAZO <=4 SENSITIVE Sensitive     Extended ESBL NEGATIVE Sensitive     * ABUNDANT ESCHERICHIA COLI  Surgical PCR screen     Status: Abnormal   Collection Time: 11/20/16  5:26 AM  Result Value Ref Range Status   MRSA, PCR POSITIVE (A) NEGATIVE Final    Comment: RESULT CALLED TO, READ BACK BY AND VERIFIED WITH: C. Dupont RN 11:35 11/20/16 ([email or web address])    Staphylococcus aureus POSITIVE (A) NEGATIVE Final    Comment: (NOTE) The Xpert SA Assay (FDA approved for NASAL specimens in patients 31 years of age  and older), is one component of a comprehensive surveillance program. It is not intended to diagnose infection nor to guide or monitor treatment.     Anti-infectives:  Anti-infectives    Start     Dose/Rate Route Frequency Ordered Stop   11/17/16 0000  vancomycin (VANCOCIN) 1,500 mg in sodium chloride 0.9 % 500 mL IVPB  Status:  Discontinued     1,500 mg 250 mL/hr over 120 Minutes Intravenous Every 8 hours 11/16/16 1700 11/19/16 0947   11/15/16 1600  vancomycin (VANCOCIN) IVPB 1000 mg/200 mL premix  Status:  Discontinued     1,000 mg 200 mL/hr over 60 Minutes Intravenous Every 8 hours 11/15/16 1332 11/16/16 1700   11/15/16 0130  vancomycin (VANCOCIN) IVPB 1000 mg/200 mL premix  Status:  Discontinued     1,000 mg 200 mL/hr over 60 Minutes Intravenous Every 8 hours 11/14/16 1628 11/15/16 1332   11/14/16 1730  vancomycin (VANCOCIN) 2,000 mg in sodium chloride 0.9 % 500 mL IVPB     2,000 mg 250 mL/hr over 120 Minutes Intravenous  Once 11/14/16 1628 11/14/16 2042   11/13/16 1400  piperacillin-tazobactam (ZOSYN) IVPB 3.375 g  Status:  Discontinued     3.375 g 100 mL/hr over 30 Minutes Intravenous Every 8 hours 11/13/16 1115 11/13/16 1121   11/12/16 1130  piperacillin-tazobactam (ZOSYN) IVPB 3.375 g  Status:  Discontinued     3.375 g 12.5 mL/hr over 240 Minutes Intravenous Every 8 hours 11/12/16 1038 11/24/16 0943   11/05/16 0600  ceFAZolin (ANCEF) IVPB 2g/100 mL premix     2 g 200 mL/hr over 30 Minutes Intravenous  Once 11/05/16 0559 11/05/16 0729      Best Practice/Protocols:  VTE Prophylaxis: Lovenox (prophylaxtic dose) and Mechanical GI Prophylaxis: Proton Pump Inhibitor Continous Sedation Still on sedation.  Consults: Treatment Team:  Donalee Citrin, MD Myrene Galas, MD Serena Colonel, MD    Events:  Subjective:    Overnight Issues: Gut still not working; otherwise no events  Objective:  Vital signs for last 24 hours: Temp:  [98.3 F (36.8 C)-99.5 F (37.5 C)] 99.3  F (37.4 C) (09/15 0800) Pulse Rate:  [66-94] 94 (09/15 0900) Resp:  [  16-25] 18 (09/15 0900) BP: (122-144)/(65-94) 143/83 (09/15 0900) SpO2:  [98 %-100 %] 100 % (09/15 0837) FiO2 (%):  [40 %] 40 % (09/15 0837) Weight:  [100.7 kg (222 lb 0.1 oz)] 100.7 kg (222 lb 0.1 oz) (09/15 0454)  Hemodynamic parameters for last 24 hours:    Intake/Output from previous day: 09/14 0701 - 09/15 0700 In: 7514.1 [I.V.:7264.1; IV Piggyback:250] Out: 7785 [WUJWJ:1914; Emesis/NG output:1270; Drains:75]  Intake/Output this shift: Total I/O In: 383.6 [I.V.:383.6] Out: 450 [Urine:450]  Vent settings for last 24 hours: Vent Mode: PRVC FiO2 (%):  [40 %] 40 % Set Rate:  [16 bmp] 16 bmp Vt Set:  [560 mL] 560 mL PEEP:  [5 cmH20] 5 cmH20 Plateau Pressure:  [21 cmH20-28 cmH20] 21 cmH20  Physical Exam:  General: alert and in no distress Neuro: alert, nonfocal exam and will follow commands Resp: clear to auscultation bilaterally GI: Abd somewhat firm, moderately distended, nontender - stable exam Extremities: edema 2+ and edemal has improved with diuresis  Results for orders placed or performed during the hospital encounter of 11/05/16 (from the past 24 hour(s))  Glucose, capillary     Status: Abnormal   Collection Time: 11/27/16 11:47 AM  Result Value Ref Range   Glucose-Capillary 111 (H) 65 - 99 mg/dL  Basic metabolic panel     Status: Abnormal   Collection Time: 11/27/16  1:01 PM  Result Value Ref Range   Sodium 128 (L) 135 - 145 mmol/L   Potassium 3.2 (L) 3.5 - 5.1 mmol/L   Chloride 97 (L) 101 - 111 mmol/L   CO2 27 22 - 32 mmol/L   Glucose, Bld 847 (HH) 65 - 99 mg/dL   BUN 10 6 - 20 mg/dL   Creatinine, Ser 7.82 0.61 - 1.24 mg/dL   Calcium 6.7 (L) 8.9 - 10.3 mg/dL   GFR calc non Af Amer >60 >60 mL/min   GFR calc Af Amer >60 >60 mL/min   Anion gap 4 (L) 5 - 15  Phosphorus     Status: None   Collection Time: 11/27/16  1:01 PM  Result Value Ref Range   Phosphorus 4.4 2.5 - 4.6 mg/dL   Magnesium     Status: Abnormal   Collection Time: 11/27/16  1:01 PM  Result Value Ref Range   Magnesium 1.2 (L) 1.7 - 2.4 mg/dL  Basic metabolic panel     Status: Abnormal   Collection Time: 11/27/16  2:42 PM  Result Value Ref Range   Sodium 137 135 - 145 mmol/L   Potassium 3.6 3.5 - 5.1 mmol/L   Chloride 98 (L) 101 - 111 mmol/L   CO2 33 (H) 22 - 32 mmol/L   Glucose, Bld 118 (H) 65 - 99 mg/dL   BUN 13 6 - 20 mg/dL   Creatinine, Ser 9.56 0.61 - 1.24 mg/dL   Calcium 8.1 (L) 8.9 - 10.3 mg/dL   GFR calc non Af Amer >60 >60 mL/min   GFR calc Af Amer >60 >60 mL/min   Anion gap 6 5 - 15  Magnesium     Status: Abnormal   Collection Time: 11/27/16  2:42 PM  Result Value Ref Range   Magnesium 1.3 (L) 1.7 - 2.4 mg/dL  Phosphorus     Status: Abnormal   Collection Time: 11/27/16  2:42 PM  Result Value Ref Range   Phosphorus 5.5 (H) 2.5 - 4.6 mg/dL  Glucose, capillary     Status: Abnormal   Collection Time: 11/27/16  4:17 PM  Result Value Ref Range   Glucose-Capillary 120 (H) 65 - 99 mg/dL  Glucose, capillary     Status: Abnormal   Collection Time: 11/27/16  7:24 PM  Result Value Ref Range   Glucose-Capillary 126 (H) 65 - 99 mg/dL  Glucose, capillary     Status: Abnormal   Collection Time: 11/28/16 12:54 AM  Result Value Ref Range   Glucose-Capillary 114 (H) 65 - 99 mg/dL  Glucose, capillary     Status: Abnormal   Collection Time: 11/28/16  3:04 AM  Result Value Ref Range   Glucose-Capillary 129 (H) 65 - 99 mg/dL  CBC with Differential/Platelet     Status: Abnormal   Collection Time: 11/28/16  4:53 AM  Result Value Ref Range   WBC 15.3 (H) 4.0 - 10.5 K/uL   RBC 2.84 (L) 4.22 - 5.81 MIL/uL   Hemoglobin 8.5 (L) 13.0 - 17.0 g/dL   HCT 40.9 (L) 81.1 - 91.4 %   MCV 90.8 78.0 - 100.0 fL   MCH 29.9 26.0 - 34.0 pg   MCHC 32.9 30.0 - 36.0 g/dL   RDW 78.2 (H) 95.6 - 21.3 %   Platelets 453 (H) 150 - 400 K/uL   Neutrophils Relative % 79 %   Lymphocytes Relative 11 %   Monocytes  Relative 9 %   Eosinophils Relative 1 %   Basophils Relative 0 %   Neutro Abs 12.0 (H) 1.7 - 7.7 K/uL   Lymphs Abs 1.7 0.7 - 4.0 K/uL   Monocytes Absolute 1.4 (H) 0.1 - 1.0 K/uL   Eosinophils Absolute 0.2 0.0 - 0.7 K/uL   Basophils Absolute 0.0 0.0 - 0.1 K/uL   RBC Morphology POLYCHROMASIA PRESENT   Basic metabolic panel     Status: Abnormal   Collection Time: 11/28/16  4:53 AM  Result Value Ref Range   Sodium 134 (L) 135 - 145 mmol/L   Potassium 4.2 3.5 - 5.1 mmol/L   Chloride 99 (L) 101 - 111 mmol/L   CO2 30 22 - 32 mmol/L   Glucose, Bld 107 (H) 65 - 99 mg/dL   BUN 13 6 - 20 mg/dL   Creatinine, Ser 0.86 0.61 - 1.24 mg/dL   Calcium 7.9 (L) 8.9 - 10.3 mg/dL   GFR calc non Af Amer >60 >60 mL/min   GFR calc Af Amer >60 >60 mL/min   Anion gap 5 5 - 15  Magnesium     Status: Abnormal   Collection Time: 11/28/16  4:53 AM  Result Value Ref Range   Magnesium 1.6 (L) 1.7 - 2.4 mg/dL  Phosphorus     Status: Abnormal   Collection Time: 11/28/16  4:53 AM  Result Value Ref Range   Phosphorus 5.3 (H) 2.5 - 4.6 mg/dL  Glucose, capillary     Status: Abnormal   Collection Time: 11/28/16  7:57 AM  Result Value Ref Range   Glucose-Capillary 124 (H) 65 - 99 mg/dL     Assessment/Plan:   NEURO  Altered Mental Status:  agitation, delirium, pain and sedation   Plan: Adjust medication as tolerated.  PULM  Atelectasis/collapse (bibasilar)   Plan: Continue to try to wean to trach collar  CARDIO  Ventricular Premature Beats (PVCs without comprommise)   Plan: Check electrolytes  RENAL  Has diurresed very well with Lasix over the last two days   Plan: Has one additional dose of lasix  GI  s/p Exploratory Laparotomy and abdominal compartment syndrome with SB ischemia Ileus  WVac 75cc serous  Plan: CPM with NGT decompression. Continue reglan. Potential CT A/P tomorrow vs Monday if ileus persists  ID  Currently not treating any infectious sources. WBC stable at 15, afebrile.   Plan: CPM   HEME  Hemoglobin stable   Plan: CPM  ENDO No known problems   Plan: CPM  Global Issues  May be difficult to wean the patient with his somewhat firm abdomen, but I do not believe that he has abdominal compartment syndrome. He has briskly diuresed in the last 24hrs and his abdomen is now softer than before. Continue reglan, SPM otherwise.    LOS: 23 days   Additional comments:I reviewed the patient's new clinical lab test results. cbc/bmp  Critical Care Total Time*: 35 Minutes   Andria Meuse 11/28/2016  *Care during the described time interval was provided by me and/or other providers on the critical care team.  I have reviewed this patient's available data, including medical history, events of note, physical examination and test results as part of my evaluation.

## 2016-11-29 ENCOUNTER — Inpatient Hospital Stay (HOSPITAL_COMMUNITY): Payer: Self-pay

## 2016-11-29 LAB — MAGNESIUM: Magnesium: 1.6 mg/dL — ABNORMAL LOW (ref 1.7–2.4)

## 2016-11-29 LAB — GLUCOSE, CAPILLARY
GLUCOSE-CAPILLARY: 107 mg/dL — AB (ref 65–99)
GLUCOSE-CAPILLARY: 119 mg/dL — AB (ref 65–99)
GLUCOSE-CAPILLARY: 91 mg/dL (ref 65–99)
Glucose-Capillary: 118 mg/dL — ABNORMAL HIGH (ref 65–99)
Glucose-Capillary: 122 mg/dL — ABNORMAL HIGH (ref 65–99)
Glucose-Capillary: 121 mg/dL — ABNORMAL HIGH (ref 65–99)

## 2016-11-29 LAB — PHOSPHORUS: Phosphorus: 5.7 mg/dL — ABNORMAL HIGH (ref 2.5–4.6)

## 2016-11-29 MED ORDER — TRACE MINERALS CR-CU-MN-SE-ZN 10-1000-500-60 MCG/ML IV SOLN
INTRAVENOUS | Status: AC
  Administered 2016-11-29: 18:00:00 via INTRAVENOUS
  Filled 2016-11-29: qty 2640

## 2016-11-29 MED ORDER — IOPAMIDOL (ISOVUE-300) INJECTION 61%
INTRAVENOUS | Status: AC
  Administered 2016-11-29: 100 mL
  Filled 2016-11-29: qty 100

## 2016-11-29 MED ORDER — IOPAMIDOL (ISOVUE-300) INJECTION 61%
INTRAVENOUS | Status: AC
  Administered 2016-11-29: 30 mL
  Filled 2016-11-29: qty 30

## 2016-11-29 MED ORDER — PIPERACILLIN-TAZOBACTAM 3.375 G IVPB
3.3750 g | Freq: Three times a day (TID) | INTRAVENOUS | Status: DC
  Administered 2016-11-29 – 2016-12-02 (×9): 3.375 g via INTRAVENOUS
  Filled 2016-11-29 (×10): qty 50

## 2016-11-29 MED ORDER — DEXTROSE 5 % IV SOLN
3.0000 g | Freq: Once | INTRAVENOUS | Status: AC
  Administered 2016-11-29: 3 g via INTRAVENOUS
  Filled 2016-11-29: qty 6

## 2016-11-29 MED ORDER — FAT EMULSION 20 % IV EMUL
240.0000 mL | INTRAVENOUS | Status: AC
  Administered 2016-11-29: 240 mL via INTRAVENOUS
  Filled 2016-11-29: qty 250

## 2016-11-29 NOTE — Progress Notes (Signed)
Ct shows multiple fluid collections. No free air. Some thickness of SB loops This helps explain ileus Some of the midline fluid collections appear to be draining into wound vac (no enteric contents) Will ask IR about perc drain other ones on Monday Start zosyn  Chase Ayala. Andrey Campanile, MD, FACS General, Bariatric, & Minimally Invasive Surgery [facility], [place]
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
## 2016-11-29 NOTE — Progress Notes (Signed)
Pharmacy Antibiotic Note  Chase Ayala is a 26 y.o. male admitted on 11/05/2016 with intra-abdominal infection. Pharmacy has been consulted for Zosyn  Dosing. WBC elevated at 15.3 and Tmax 99.8. Scr  < 1 with CrCl > 100 ml/min. Patient is S/P SB anastamosis and closure of abdomen.   Plan: Zosyn 3.375 gm every 8 hours  Monitor renal function and clinical s/sx of infection   Height:  (180.3 cm) Weight: 208 lb 15.9 oz (94.8 kg) IBW/kg (Calculated) : 75.3  Temp (24hrs), Avg:99.3 F (37.4 C), Min:98.8 F (37.1 C), Max:99.8 F (37.7 C)   Recent Labs Lab 11/24/16 0555 11/24/16 1947 11/25/16 0357 11/26/16 0830 11/26/16 0911 11/27/16 0743 11/27/16 1301 11/27/16 1442 11/28/16 0453  WBC 14.5*  --  12.9* 14.3*  --  15.1*  --   --  15.3*  CREATININE  --  0.56*  --   --  0.65  --  0.62 0.64 0.64    Estimated Creatinine Clearance: 165.9 mL/min (by C-G formula based on SCr of 0.64 mg/dL).    No Known Allergies  Antimicrobials this admission: 8/30 Zosyn >>>9/11 9/16 Zosyn >> 9/4 Vanc >>9/6   Microbiology results: 9/5 Peritoneal fluid: Pan- S E. coli  Thank you for allowing pharmacy to be a part of this patient's care.  Della Goo, PharmD PGY2 Infectious Diseases Pharmacy Resident Pager: [phone]  11/29/2016 6:41 PM
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
## 2016-11-30 ENCOUNTER — Inpatient Hospital Stay (HOSPITAL_COMMUNITY): Payer: Self-pay

## 2016-11-30 DIAGNOSIS — L8990 Pressure ulcer of unspecified site, unspecified stage: Secondary | ICD-10-CM | POA: Insufficient documentation

## 2016-11-30 LAB — COMPREHENSIVE METABOLIC PANEL
ALBUMIN: 1.5 g/dL — AB (ref 3.5–5.0)
ALK PHOS: 117 U/L (ref 38–126)
ALT: 33 U/L (ref 17–63)
AST: 34 U/L (ref 15–41)
Anion gap: 7 (ref 5–15)
BILIRUBIN TOTAL: 2 mg/dL — AB (ref 0.3–1.2)
BUN: 10 mg/dL (ref 6–20)
CALCIUM: 7.9 mg/dL — AB (ref 8.9–10.3)
CO2: 29 mmol/L (ref 22–32)
Chloride: 98 mmol/L — ABNORMAL LOW (ref 101–111)
Creatinine, Ser: 0.62 mg/dL (ref 0.61–1.24)
GFR calc Af Amer: >60 mL/min (ref >60)
GLUCOSE: 118 mg/dL — AB (ref 65–99)
Potassium: 3.7 mmol/L (ref 3.5–5.1)
Sodium: 134 mmol/L — ABNORMAL LOW (ref 135–145)
TOTAL PROTEIN: 5.7 g/dL — AB (ref 6.5–8.1)

## 2016-11-30 LAB — PROTIME-INR
INR: 1.29
Prothrombin Time: 15.9 seconds — ABNORMAL HIGH (ref 11.4–15.2)

## 2016-11-30 LAB — DIFFERENTIAL
Basophils Absolute: 0 10*3/uL (ref 0.0–0.1)
Basophils Relative: 0 %
EOS ABS: 0.2 10*3/uL (ref 0.0–0.7)
EOS PCT: 1 %
LYMPHS PCT: 10 %
Lymphs Abs: 1.2 10*3/uL (ref 0.7–4.0)
MONO ABS: 1.6 10*3/uL — AB (ref 0.1–1.0)
Monocytes Relative: 14 %
NEUTROS PCT: 75 %
Neutro Abs: 8.6 10*3/uL — ABNORMAL HIGH (ref 1.7–7.7)

## 2016-11-30 LAB — CBC
HCT: 23.7 % — ABNORMAL LOW (ref 39.0–52.0)
Hemoglobin: 7.7 g/dL — ABNORMAL LOW (ref 13.0–17.0)
MCH: 29.1 pg (ref 26.0–34.0)
MCHC: 32.5 g/dL (ref 30.0–36.0)
MCV: 89.4 fL (ref 78.0–100.0)
PLATELETS: 410 10*3/uL — AB (ref 150–400)
RBC: 2.65 MIL/uL — ABNORMAL LOW (ref 4.22–5.81)
RDW: 15.4 % (ref 11.5–15.5)
WBC: 11.5 10*3/uL — ABNORMAL HIGH (ref 4.0–10.5)

## 2016-11-30 LAB — TRIGLYCERIDES: TRIGLYCERIDES: 245 mg/dL — AB (ref <150)

## 2016-11-30 LAB — GLUCOSE, CAPILLARY
GLUCOSE-CAPILLARY: 128 mg/dL — AB (ref 65–99)
GLUCOSE-CAPILLARY: 116 mg/dL — AB (ref 65–99)
Glucose-Capillary: 117 mg/dL — ABNORMAL HIGH (ref 65–99)
Glucose-Capillary: 133 mg/dL — ABNORMAL HIGH (ref 65–99)
Glucose-Capillary: 110 mg/dL — ABNORMAL HIGH (ref 65–99)
Glucose-Capillary: 111 mg/dL — ABNORMAL HIGH (ref 65–99)

## 2016-11-30 LAB — PHOSPHORUS: Phosphorus: 5 mg/dL — ABNORMAL HIGH (ref 2.5–4.6)

## 2016-11-30 LAB — PREALBUMIN: Prealbumin: 8.7 mg/dL — ABNORMAL LOW (ref 18–38)

## 2016-11-30 LAB — MAGNESIUM: MAGNESIUM: 1.6 mg/dL — AB (ref 1.7–2.4)

## 2016-11-30 LAB — APTT: aPTT: 34 seconds (ref 24–36)

## 2016-11-30 MED ORDER — IPRATROPIUM-ALBUTEROL 0.5-2.5 (3) MG/3ML IN SOLN: 3.0000 mL | RESPIRATORY_TRACT | Status: DC | PRN

## 2016-11-30 MED ORDER — DEXMEDETOMIDINE HCL IN NACL 400 MCG/100ML IV SOLN
0.4000 ug/kg/h | INTRAVENOUS | Status: DC
  Administered 2016-11-30 – 2016-12-01 (×10): 1.6 ug/kg/h via INTRAVENOUS
  Administered 2016-12-01: 1.602 ug/kg/h via INTRAVENOUS
  Administered 2016-12-01 – 2016-12-02 (×10): 1.6 ug/kg/h via INTRAVENOUS
  Administered 2016-12-03: 1.8 ug/kg/h via INTRAVENOUS
  Administered 2016-12-03: 2 ug/kg/h via INTRAVENOUS
  Administered 2016-12-03: 1.6 ug/kg/h via INTRAVENOUS
  Administered 2016-12-03: 2 ug/kg/h via INTRAVENOUS
  Administered 2016-12-03: 1.6 ug/kg/h via INTRAVENOUS
  Administered 2016-12-03 (×2): 2 ug/kg/h via INTRAVENOUS
  Administered 2016-12-03: 1.8 ug/kg/h via INTRAVENOUS
  Administered 2016-12-03: 1.6 ug/kg/h via INTRAVENOUS
  Administered 2016-12-04 (×2): 2 ug/kg/h via INTRAVENOUS
  Administered 2016-12-04 (×3): 1.8 ug/kg/h via INTRAVENOUS
  Administered 2016-12-04 (×2): 2 ug/kg/h via INTRAVENOUS
  Administered 2016-12-04 – 2016-12-05 (×6): 1.8 ug/kg/h via INTRAVENOUS
  Administered 2016-12-05: 2 ug/kg/h via INTRAVENOUS
  Administered 2016-12-05 (×2): 1.8 ug/kg/h via INTRAVENOUS
  Administered 2016-12-05: 1.3 ug/kg/h via INTRAVENOUS
  Administered 2016-12-05 (×2): 1.8 ug/kg/h via INTRAVENOUS
  Administered 2016-12-06 (×2): 2 ug/kg/h via INTRAVENOUS
  Administered 2016-12-06: 1.8 ug/kg/h via INTRAVENOUS
  Administered 2016-12-06 (×3): 2 ug/kg/h via INTRAVENOUS
  Administered 2016-12-06: 1.2 ug/kg/h via INTRAVENOUS
  Administered 2016-12-06: 1.1 ug/kg/h via INTRAVENOUS
  Administered 2016-12-07 (×11): 2 ug/kg/h via INTRAVENOUS
  Administered 2016-12-08 (×2): 1.2 ug/kg/h via INTRAVENOUS
  Administered 2016-12-08: 1.4 ug/kg/h via INTRAVENOUS
  Administered 2016-12-08 (×2): 2 ug/kg/h via INTRAVENOUS
  Administered 2016-12-08 (×2): 0.8 ug/kg/h via INTRAVENOUS
  Administered 2016-12-09: 1.2 ug/kg/h via INTRAVENOUS
  Administered 2016-12-09: 1 ug/kg/h via INTRAVENOUS
  Administered 2016-12-09: 0.6 ug/kg/h via INTRAVENOUS
  Administered 2016-12-09: 1.2 ug/kg/h via INTRAVENOUS
  Administered 2016-12-10 (×2): 0.6 ug/kg/h via INTRAVENOUS
  Filled 2016-11-30 (×2): qty 100
  Filled 2016-11-30: qty 200
  Filled 2016-11-30 (×18): qty 100
  Filled 2016-11-30: qty 200
  Filled 2016-11-30 (×26): qty 100
  Filled 2016-11-30: qty 200
  Filled 2016-11-30 (×7): qty 100
  Filled 2016-11-30: qty 200
  Filled 2016-11-30 (×14): qty 100
  Filled 2016-11-30: qty 200
  Filled 2016-11-30 (×7): qty 100

## 2016-11-30 MED ORDER — TRACE MINERALS CR-CU-MN-SE-ZN 10-1000-500-60 MCG/ML IV SOLN
INTRAVENOUS | Status: AC
  Administered 2016-11-30: 18:00:00 via INTRAVENOUS
  Filled 2016-11-30: qty 2640

## 2016-11-30 MED ORDER — MAGNESIUM SULFATE 4 GM/100ML IV SOLN
4.0000 g | Freq: Once | INTRAVENOUS | Status: AC
  Administered 2016-11-30: 4 g via INTRAVENOUS
  Filled 2016-11-30 (×2): qty 100

## 2016-11-30 MED ORDER — FAT EMULSION 20 % IV EMUL
240.0000 mL | INTRAVENOUS | Status: AC
  Administered 2016-11-30: 240 mL via INTRAVENOUS
  Filled 2016-11-30: qty 250

## 2016-11-30 MED ORDER — POTASSIUM CHLORIDE 10 MEQ/50ML IV SOLN
10.0000 meq | INTRAVENOUS | Status: AC
  Administered 2016-11-30 (×4): 10 meq via INTRAVENOUS
  Filled 2016-11-30 (×4): qty 50

## 2016-11-30 MED ORDER — COLLAGENASE 250 UNIT/GM EX OINT
TOPICAL_OINTMENT | Freq: Every day | CUTANEOUS | Status: DC
  Administered 2016-11-30 – 2016-12-03 (×4): via TOPICAL
  Administered 2016-12-04: 1 via TOPICAL
  Administered 2016-12-05 – 2016-12-06 (×2): via TOPICAL
  Administered 2016-12-07 – 2016-12-08 (×2): 1 via TOPICAL
  Filled 2016-11-30: qty 30

## 2016-11-30 MED ORDER — ENOXAPARIN SODIUM 40 MG/0.4ML ~~LOC~~ SOLN
40.0000 mg | Freq: Every day | SUBCUTANEOUS | Status: DC
  Administered 2016-12-03 – 2016-12-14 (×11): 40 mg via SUBCUTANEOUS
  Filled 2016-11-30 (×13): qty 0.4

## 2016-11-30 NOTE — Consult Note (Signed)
WOC Nurse wound follow up Wound type: chin ulceration Patient with cervical collar in place, new area of ulceration under collar Measurement: 3cm x 2cm x 0.1cm  Wound bed:100% yellow Drainage (amount, consistency, odor) minimal, no odor Periwound:intact, patient trached as well Dressing procedure/placement/frequency: Add enzymatic debridement ointment and saline moist gauze with foam. Monitor status of wound each shift.  Explained rationale of treatment to patient's family member in the room, based on location and evolution of this wound may need plastic surgery to evaluate once clean.  WOC Nurse team will follow along with you for weekly wound assessments.  Please notify me of any acute changes in the wounds or any new areas of concerns Jazen Spraggins [facility] MSN, RN,CWOCN, CNS, CWON-AP [phone]

## 2016-11-30 NOTE — Progress Notes (Signed)
OT Cancellation Note  Patient Details Name: Chase Ayala MRN: [id number] DOB: 1990/07/07   Cancelled Treatment:    Reason Eval/Treat Not Completed: Medical issues which prohibited therapy. Will follow up as time allows.  Gaye Alken M.S., OTR/L Pager: [phone]  11/30/2016, 10:39 AM

## 2016-11-30 NOTE — Progress Notes (Signed)
Follow up - Trauma and Critical Care  Patient Details:    Chase Ayala is an 26 y.o. male.  Lines/tubes : PICC Triple Lumen 11/19/16 PICC Right Brachial 40 cm 0 cm (Active)  Indication for Insertion or Continuance of Line Prolonged intravenous therapies 11/29/2016  8:00 PM  Exposed Catheter (cm) 0 cm 11/24/2016  8:00 AM  Site Assessment Clean;Dry;Intact 11/29/2016  8:00 PM  Lumen #1 Status Infusing 11/29/2016  8:00 PM  Lumen #2 Status Infusing 11/29/2016  8:00 PM  Lumen #3 Status Infusing;In-line blood sampling system in place 11/29/2016  8:00 PM  Dressing Type Transparent;Occlusive 11/29/2016  8:00 PM  Dressing Status Clean;Dry;Intact;Antimicrobial disc in place 11/29/2016  8:00 PM  Line Care Connections checked and tightened 11/29/2016  8:00 PM  Dressing Intervention New dressing;Dressing changed;Antimicrobial disc changed 11/26/2016 11:00 PM  Dressing Change Due 12/03/16 11/29/2016  8:00 AM     Negative Pressure Wound Therapy Abdomen (Active)  Last dressing change 11/26/16 11/27/2016  8:00 AM  Site / Wound Assessment Dressing in place / Unable to assess 11/29/2016  8:00 PM  Peri-wound Assessment Intact 11/29/2016  9:00 AM  Cycle Continuous 11/28/2016  8:00 PM  Target Pressure (mmHg) 125 11/28/2016  8:00 PM  Instillation Volume 475 mL 11/21/2016  3:00 AM  Canister Changed No 11/28/2016  8:00 PM  Dressing Status Intact 11/29/2016  9:00 AM  Drainage Amount Minimal 11/29/2016  9:00 AM  Drainage Description Serous;Serosanguineous;Other (Comment) 11/29/2016  9:00 AM  Output (mL) 25 mL 11/30/2016  6:33 AM     NG/OG Tube Nasogastric 16 Fr. Right nare Xray Measured external length of tube (Active)  Site Assessment Clean;Intact;Dry 11/29/2016  8:00 PM  Ongoing Placement Verification No change in respiratory status;No acute changes, not attributed to clinical condition 11/29/2016  8:00 PM  Status Suction-low intermittent 11/29/2016  8:00 PM  Drainage Appearance Bile;Green 11/29/2016  8:00 PM  Output (mL) 600  mL 11/30/2016  6:33 AM     Urethral Catheter Carilyn Goodpasture, RN Double-lumen 16 Fr. (Active)  Indication for Insertion or Continuance of Catheter Acute urinary retention 11/29/2016  8:00 PM  Site Assessment Clean;Intact 11/29/2016  8:00 PM  Catheter Maintenance Bag below level of bladder;Catheter secured;Drainage bag/tubing not touching floor;Insertion date on drainage bag;No dependent loops;Seal intact 11/29/2016  8:00 PM  Collection Container Standard drainage bag 11/29/2016  8:00 PM  Securement Method Leg strap 11/29/2016  8:00 PM  Urinary Catheter Interventions Unclamped 11/28/2016  4:00 AM  Input (mL) 80 mL 11/18/2016  2:00 PM  Output (mL) 950 mL 11/30/2016  6:33 AM    Microbiology/Sepsis markers: Results for orders placed or performed during the hospital encounter of 11/05/16  Culture, Urine     Status: None   Collection Time: 11/12/16 10:34 AM  Result Value Ref Range Status   Specimen Description URINE, CATHETERIZED  Final   Special Requests NONE  Final   Culture NO GROWTH  Final   Report Status 11/13/2016 FINAL  Final  Culture, respiratory (NON-Expectorated)     Status: Abnormal   Collection Time: 11/12/16 11:11 AM  Result Value Ref Range Status   Specimen Description TRACHEAL ASPIRATE  Final   Special Requests NONE  Final   Gram Stain   Final    RARE SQUAMOUS EPITHELIAL CELLS PRESENT FEW WBC PRESENT,BOTH PMN AND MONONUCLEAR RARE GRAM NEGATIVE RODS RARE GRAM POSITIVE COCCI RARE GRAM NEGATIVE COCCI    Culture MULTIPLE ORGANISMS PRESENT, NONE PREDOMINANT (A)  Final   Report Status 11/14/2016 FINAL  Final  Culture, blood (routine x 2)     Status: None   Collection Time: 11/12/16 11:18 AM  Result Value Ref Range Status   Specimen Description BLOOD LEFT HAND  Final   Special Requests IN PEDIATRIC BOTTLE Blood Culture adequate volume  Final   Culture NO GROWTH 5 DAYS  Final   Report Status 11/17/2016 FINAL  Final  Culture, blood (routine x 2)     Status: None   Collection Time:  11/12/16 11:22 AM  Result Value Ref Range Status   Specimen Description BLOOD LEFT ANTECUBITAL  Final   Special Requests IN PEDIATRIC BOTTLE Blood Culture adequate volume  Final   Culture NO GROWTH 5 DAYS  Final   Report Status 11/17/2016 FINAL  Final  Culture, respiratory (NON-Expectorated)     Status: Abnormal   Collection Time: 11/13/16 12:10 PM  Result Value Ref Range Status   Specimen Description TRACHEAL ASPIRATE  Final   Special Requests Normal  Final   Gram Stain   Final    RARE WBC PRESENT,BOTH PMN AND MONONUCLEAR FEW GRAM VARIABLE ROD RARE GRAM POSITIVE COCCI IN PAIRS    Culture MULTIPLE ORGANISMS PRESENT, NONE PREDOMINANT (A)  Final   Report Status 11/15/2016 FINAL  Final  Culture, blood (Routine X 2) w Reflex to ID Panel     Status: None   Collection Time: 11/13/16 12:40 PM  Result Value Ref Range Status   Specimen Description BLOOD RIGHT ANTECUBITAL  Final   Special Requests IN PEDIATRIC BOTTLE Blood Culture adequate volume  Final   Culture NO GROWTH 5 DAYS  Final   Report Status 11/18/2016 FINAL  Final  Culture, blood (Routine X 2) w Reflex to ID Panel     Status: None   Collection Time: 11/13/16 12:40 PM  Result Value Ref Range Status   Specimen Description BLOOD LEFT ANTECUBITAL  Final   Special Requests IN PEDIATRIC BOTTLE Blood Culture adequate volume  Final   Culture NO GROWTH 5 DAYS  Final   Report Status 11/18/2016 FINAL  Final  Culture, Urine     Status: None   Collection Time: 11/13/16  1:56 PM  Result Value Ref Range Status   Specimen Description URINE, CATHETERIZED  Final   Special Requests Normal  Final   Culture NO GROWTH  Final   Report Status 11/14/2016 FINAL  Final  MRSA PCR Screening     Status: Abnormal   Collection Time: 11/14/16  1:43 PM  Result Value Ref Range Status   MRSA by PCR POSITIVE (A) NEGATIVE Final    Comment:        The GeneXpert MRSA Assay (FDA approved for NASAL specimens only), is one component of a comprehensive MRSA  colonization surveillance program. It is not intended to diagnose MRSA infection nor to guide or monitor treatment for MRSA infections. RESULT CALLED TO, READ BACK BY AND VERIFIED WITH: Audree Bane [id number] 09.01.2018 N. MORRIS   Aerobic/Anaerobic Culture (surgical/deep wound)     Status: None   Collection Time: 11/18/16  4:11 PM  Result Value Ref Range Status   Specimen Description PERITONEAL  Final   Special Requests POF VANC AND ZOSYN  Final   Gram Stain   Final    FEW WBC PRESENT, PREDOMINANTLY MONONUCLEAR ABUNDANT GRAM NEGATIVE RODS RARE GRAM POSITIVE COCCI IN PAIRS RARE GRAM POSITIVE RODS    Culture   Final    ABUNDANT ESCHERICHIA COLI MIXED ANAEROBIC FLORA PRESENT.  CALL LAB IF FURTHER IID REQUIRED.    Report  Status 11/22/2016 FINAL  Final   Organism ID, Bacteria ESCHERICHIA COLI  Final      Susceptibility   Escherichia coli - MIC*    AMPICILLIN 4 SENSITIVE Sensitive     CEFAZOLIN <=4 SENSITIVE Sensitive     CEFEPIME <=1 SENSITIVE Sensitive     CEFTAZIDIME <=1 SENSITIVE Sensitive     CEFTRIAXONE <=1 SENSITIVE Sensitive     CIPROFLOXACIN <=0.25 SENSITIVE Sensitive     GENTAMICIN <=1 SENSITIVE Sensitive     IMIPENEM <=0.25 SENSITIVE Sensitive     TRIMETH/SULFA <=20 SENSITIVE Sensitive     AMPICILLIN/SULBACTAM <=2 SENSITIVE Sensitive     PIP/TAZO <=4 SENSITIVE Sensitive     Extended ESBL NEGATIVE Sensitive     * ABUNDANT ESCHERICHIA COLI  Surgical PCR screen     Status: Abnormal   Collection Time: 11/20/16  5:26 AM  Result Value Ref Range Status   MRSA, PCR POSITIVE (A) NEGATIVE Final    Comment: RESULT CALLED TO, READ BACK BY AND VERIFIED WITH: C. Dupont RN 11:35 11/20/16 ([email or web address])    Staphylococcus aureus POSITIVE (A) NEGATIVE Final    Comment: (NOTE) The Xpert SA Assay (FDA approved for NASAL specimens in patients 59 years of age and older), is one component of a comprehensive surveillance program. It is not intended to diagnose infection nor to guide or monitor  treatment.     Anti-infectives:  Anti-infectives    Start     Dose/Rate Route Frequency Ordered Stop   11/29/16 1930  piperacillin-tazobactam (ZOSYN) IVPB 3.375 g     3.375 g 12.5 mL/hr over 240 Minutes Intravenous Every 8 hours 11/29/16 1844     11/17/16 0000  vancomycin (VANCOCIN) 1,500 mg in sodium chloride 0.9 % 500 mL IVPB  Status:  Discontinued     1,500 mg 250 mL/hr over 120 Minutes Intravenous Every 8 hours 11/16/16 1700 11/19/16 0947   11/15/16 1600  vancomycin (VANCOCIN) IVPB 1000 mg/200 mL premix  Status:  Discontinued     1,000 mg 200 mL/hr over 60 Minutes Intravenous Every 8 hours 11/15/16 1332 11/16/16 1700   11/15/16 0130  vancomycin (VANCOCIN) IVPB 1000 mg/200 mL premix  Status:  Discontinued     1,000 mg 200 mL/hr over 60 Minutes Intravenous Every 8 hours 11/14/16 1628 11/15/16 1332   11/14/16 1730  vancomycin (VANCOCIN) 2,000 mg in sodium chloride 0.9 % 500 mL IVPB     2,000 mg 250 mL/hr over 120 Minutes Intravenous  Once 11/14/16 1628 11/14/16 2042   11/13/16 1400  piperacillin-tazobactam (ZOSYN) IVPB 3.375 g  Status:  Discontinued     3.375 g 100 mL/hr over 30 Minutes Intravenous Every 8 hours 11/13/16 1115 11/13/16 1121   11/12/16 1130  piperacillin-tazobactam (ZOSYN) IVPB 3.375 g  Status:  Discontinued     3.375 g 12.5 mL/hr over 240 Minutes Intravenous Every 8 hours 11/12/16 1038 11/24/16 0943   11/05/16 0600  ceFAZolin (ANCEF) IVPB 2g/100 mL premix     2 g 200 mL/hr over 30 Minutes Intravenous  Once 11/05/16 0559 11/05/16 0729      Best Practice/Protocols:  VTE Prophylaxis: Lovenox (prophylaxtic dose) and Mechanical GI Prophylaxis: Proton Pump Inhibitor Continous Sedation Getting lots of enteral sedation also  Consults: Treatment Team:  Myrene Galas, MD Serena Colonel, MD    Events:  Subjective:    Overnight Issues: Drainage from abdominal wound VAC was purulent and NPWD had not been changed since Thursday  Objective:  Vital signs for last  24 hours: Temp:  [98.6  F (37 C)-100 F (37.8 C)] 100 F (37.8 C) (09/17 0800) Pulse Rate:  [63-112] 78 (09/17 0851) Resp:  [16-22] 16 (09/17 0851) BP: (125-155)/(50-98) 134/79 (09/17 0851) SpO2:  [98 %-100 %] 98 % (09/17 0851) FiO2 (%):  [30 %-40 %] 30 % (09/17 0851) Weight:  [92.4 kg (203 lb 11.3 oz)] 92.4 kg (203 lb 11.3 oz) (09/17 0500)  Hemodynamic parameters for last 24 hours:    Intake/Output from previous day: 09/16 0701 - 09/17 0700 In: 5861 [I.V.:5811; IV Piggyback:50] Out: 40981 [Urine:8800; Emesis/NG output:1550; Drains:50]  Intake/Output this shift: No intake/output data recorded.  Vent settings for last 24 hours: Vent Mode: PRVC FiO2 (%):  [30 %-40 %] 30 % Set Rate:  [16 bmp] 16 bmp Vt Set:  [560 mL] 560 mL PEEP:  [5 cmH20] 5 cmH20 Plateau Pressure:  [24 cmH20-31 cmH20] 28 cmH20  Physical Exam:  General: alert, no respiratory distress and still very agitated. Neuro: alert, oriented, RASS 0, agitated, weakness left upper extremity and weakness left lower extremity HEENT/Neck: trach-clean, intact CVS: regular rate and rhythm, S1, S2 normal, no murmur, click, rub or gallop and intermittent sinus tachycardia GI: Softer than last week when I last examinaed him.  Purulent drainage from the subfascial area of the upper abdomiinal wound.  NPWD removed and the patient placed on wet to dry dressings.  Able to get culture sent. Extremities: no edema, no erythema, pulses WNL and Significant edema of last week has resolved with diuresis  Results for orders placed or performed during the hospital encounter of 11/05/16 (from the past 24 hour(s))  Glucose, capillary     Status: Abnormal   Collection Time: 11/29/16 11:47 AM  Result Value Ref Range   Glucose-Capillary 107 (H) 65 - 99 mg/dL  Glucose, capillary     Status: Abnormal   Collection Time: 11/29/16  4:26 PM  Result Value Ref Range   Glucose-Capillary 119 (H) 65 - 99 mg/dL  Glucose, capillary     Status: Abnormal    Collection Time: 11/29/16  7:32 PM  Result Value Ref Range   Glucose-Capillary 121 (H) 65 - 99 mg/dL  Glucose, capillary     Status: None   Collection Time: 11/29/16 11:23 PM  Result Value Ref Range   Glucose-Capillary 91 65 - 99 mg/dL  Glucose, capillary     Status: Abnormal   Collection Time: 11/30/16  3:34 AM  Result Value Ref Range   Glucose-Capillary 128 (H) 65 - 99 mg/dL  Comprehensive metabolic panel     Status: Abnormal   Collection Time: 11/30/16  5:10 AM  Result Value Ref Range   Sodium 134 (L) 135 - 145 mmol/L   Potassium 3.7 3.5 - 5.1 mmol/L   Chloride 98 (L) 101 - 111 mmol/L   CO2 29 22 - 32 mmol/L   Glucose, Bld 118 (H) 65 - 99 mg/dL   BUN 10 6 - 20 mg/dL   Creatinine, Ser 1.91 0.61 - 1.24 mg/dL   Calcium 7.9 (L) 8.9 - 10.3 mg/dL   Total Protein 5.7 (L) 6.5 - 8.1 g/dL   Albumin 1.5 (L) 3.5 - 5.0 g/dL   AST 34 15 - 41 U/L   ALT 33 17 - 63 U/L   Alkaline Phosphatase 117 38 - 126 U/L   Total Bilirubin 2.0 (H) 0.3 - 1.2 mg/dL   GFR calc non Af Amer >60 >60 mL/min   GFR calc Af Amer >60 >60 mL/min   Anion gap 7 5 - 15  Magnesium     Status: Abnormal   Collection Time: 11/30/16  5:10 AM  Result Value Ref Range   Magnesium 1.6 (L) 1.7 - 2.4 mg/dL  Phosphorus     Status: Abnormal   Collection Time: 11/30/16  5:10 AM  Result Value Ref Range   Phosphorus 5.0 (H) 2.5 - 4.6 mg/dL  CBC     Status: Abnormal   Collection Time: 11/30/16  5:10 AM  Result Value Ref Range   WBC 11.5 (H) 4.0 - 10.5 K/uL   RBC 2.65 (L) 4.22 - 5.81 MIL/uL   Hemoglobin 7.7 (L) 13.0 - 17.0 g/dL   HCT 16.1 (L) 09.6 - 04.5 %   MCV 89.4 78.0 - 100.0 fL   MCH 29.1 26.0 - 34.0 pg   MCHC 32.5 30.0 - 36.0 g/dL   RDW 40.9 81.1 - 91.4 %   Platelets 410 (H) 150 - 400 K/uL  Differential     Status: Abnormal   Collection Time: 11/30/16  5:10 AM  Result Value Ref Range   Neutrophils Relative % 75 %   Neutro Abs 8.6 (H) 1.7 - 7.7 K/uL   Lymphocytes Relative 10 %   Lymphs Abs 1.2 0.7 - 4.0 K/uL    Monocytes Relative 14 %   Monocytes Absolute 1.6 (H) 0.1 - 1.0 K/uL   Eosinophils Relative 1 %   Eosinophils Absolute 0.2 0.0 - 0.7 K/uL   Basophils Relative 0 %   Basophils Absolute 0.0 0.0 - 0.1 K/uL  Triglycerides     Status: Abnormal   Collection Time: 11/30/16  5:10 AM  Result Value Ref Range   Triglycerides 245 (H) <150 mg/dL  Prealbumin     Status: Abnormal   Collection Time: 11/30/16  6:18 AM  Result Value Ref Range   Prealbumin 8.7 (L) 18 - 38 mg/dL  Glucose, capillary     Status: Abnormal   Collection Time: 11/30/16  8:53 AM  Result Value Ref Range   Glucose-Capillary 117 (H) 65 - 99 mg/dL   Comment 1 Notify RN    Comment 2 Document in Chart      Assessment/Plan:   NEURO  Altered Mental Status:  agitation   Plan: Trying to make the appropriate adjustments  PULM  Atelectasis/collapse (focal and LLL)   Plan: CPM.  Not able to tolerate weaning currently.  CARDIO  No specific issues   Plan: CPM  RENAL  Urine output and renal function are great   Plan: Appears as though the patient is autodiuresing which is great.  Renal function is good  GI  s/p Exploratory Laparotomy and compartment syndrome with SB ischemia with SB resection.  now has intra-abdominal abscesses.  No enteric drainage.     Plan: IR drainage of intraabdominal abscesses  ID  Abscess of Wound and Intra-Abdominal Abscess (post-operative)   Plan: IR drainage.  On Zosyn  HEME  Anemia acute blood loss anemia and anemia of critical illness)   Plan: No blood for now  ENDO No specificx issues   Plan: CPM  Global Issues  Needs IR drainage of abscess.  Cultures sent this morning.  Anaerobic specimen container not available.      LOS: 25 days   Additional comments:I reviewed the patient's new clinical lab test results. cbc/bmet and I reviewed the patients new imaging test results. cxr  Critical Care Total Time*: 45 Minutes  Hartley Urton 11/30/2016  *Care during the described time interval was  provided by me and/or other providers on the  critical care team.  I have reviewed this patient's available data, including medical history, events of note, physical examination and test results as part of my evaluation.

## 2016-11-30 NOTE — Progress Notes (Signed)
Patient ID: Chase Ayala, male   DOB: 1991-01-05, 26 y.o.   MRN: [id number]  IR aware of drain placement request In review  IR schedule unable to accommodate procedure today Will see pt in am with plan
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
## 2016-11-30 NOTE — Progress Notes (Signed)
PT Cancellation Note  Patient Details Name: Chase Ayala MRN: [id number] DOB: 09/25/1990   Cancelled Treatment:    Reason Eval/Treat Not Completed: Medical issues which prohibited therapy.  RN recommended holding (per OT) due to possible IR for drain placement.  PT will f/u tomorrow. Thanks,    Rollene Rotunda. Jeylin Woodmansee, PT, DPT [phone]   11/30/2016, 10:42 AM

## 2016-12-01 ENCOUNTER — Encounter (HOSPITAL_COMMUNITY): Payer: Self-pay

## 2016-12-01 LAB — CBC WITH DIFFERENTIAL/PLATELET
BASOS PCT: 0 %
Basophils Absolute: 0 10*3/uL (ref 0.0–0.1)
EOS ABS: 0.2 10*3/uL (ref 0.0–0.7)
Eosinophils Relative: 2 %
HCT: 24.4 % — ABNORMAL LOW (ref 39.0–52.0)
Hemoglobin: 8.3 g/dL — ABNORMAL LOW (ref 13.0–17.0)
LYMPHS PCT: 18 %
Lymphs Abs: 1.8 10*3/uL (ref 0.7–4.0)
MCH: 30.2 pg (ref 26.0–34.0)
MCHC: 34 g/dL (ref 30.0–36.0)
MCV: 88.7 fL (ref 78.0–100.0)
Monocytes Absolute: 0.9 10*3/uL (ref 0.1–1.0)
Monocytes Relative: 9 %
Neutro Abs: 7.3 10*3/uL (ref 1.7–7.7)
Neutrophils Relative %: 71 %
PLATELETS: 517 10*3/uL — AB (ref 150–400)
RBC: 2.75 MIL/uL — AB (ref 4.22–5.81)
RDW: 15.7 % — ABNORMAL HIGH (ref 11.5–15.5)
WBC: 10.2 10*3/uL (ref 4.0–10.5)

## 2016-12-01 LAB — BASIC METABOLIC PANEL
Anion gap: 8 (ref 5–15)
BUN: 10 mg/dL (ref 6–20)
CHLORIDE: 99 mmol/L — AB (ref 101–111)
CO2: 26 mmol/L (ref 22–32)
Calcium: 8 mg/dL — ABNORMAL LOW (ref 8.9–10.3)
Creatinine, Ser: 0.59 mg/dL — ABNORMAL LOW (ref 0.61–1.24)
GFR calc Af Amer: >60 mL/min (ref >60)
GFR calc non Af Amer: >60 mL/min (ref >60)
GLUCOSE: 113 mg/dL — AB (ref 65–99)
POTASSIUM: 3.7 mmol/L (ref 3.5–5.1)
SODIUM: 133 mmol/L — AB (ref 135–145)

## 2016-12-01 LAB — GLUCOSE, CAPILLARY
GLUCOSE-CAPILLARY: 122 mg/dL — AB (ref 65–99)
GLUCOSE-CAPILLARY: 125 mg/dL — AB (ref 65–99)
GLUCOSE-CAPILLARY: 103 mg/dL — AB (ref 65–99)
GLUCOSE-CAPILLARY: 114 mg/dL — AB (ref 65–99)

## 2016-12-01 LAB — PHOSPHORUS: Phosphorus: 4.5 mg/dL (ref 2.5–4.6)

## 2016-12-01 LAB — PATHOLOGIST SMEAR REVIEW

## 2016-12-01 LAB — MAGNESIUM: MAGNESIUM: 1.5 mg/dL — AB (ref 1.7–2.4)

## 2016-12-01 LAB — TRIGLYCERIDES: TRIGLYCERIDES: 277 mg/dL — AB (ref <150)

## 2016-12-01 MED ORDER — TRACE MINERALS CR-CU-MN-SE-ZN 10-1000-500-60 MCG/ML IV SOLN
INTRAVENOUS | Status: AC
  Administered 2016-12-01: 17:00:00 via INTRAVENOUS
  Filled 2016-12-01: qty 2640

## 2016-12-01 MED ORDER — MAGNESIUM SULFATE 2 GM/50ML IV SOLN
2.0000 g | Freq: Once | INTRAVENOUS | Status: AC
  Administered 2016-12-01: 2 g via INTRAVENOUS
  Filled 2016-12-01: qty 50

## 2016-12-01 MED ORDER — MIDAZOLAM HCL 2 MG/2ML IJ SOLN
2.0000 mg | INTRAMUSCULAR | Status: DC | PRN
  Administered 2016-12-03 – 2016-12-15 (×9): 2 mg via INTRAVENOUS
  Filled 2016-12-01 (×9): qty 2

## 2016-12-01 MED ORDER — MIDAZOLAM HCL 2 MG/2ML IJ SOLN
2.0000 mg | INTRAMUSCULAR | Status: AC | PRN
  Administered 2016-12-01 – 2016-12-02 (×3): 2 mg via INTRAVENOUS
  Filled 2016-12-01 (×3): qty 2

## 2016-12-01 MED ORDER — PROPOFOL 1000 MG/100ML IV EMUL: 0.0000 ug/kg/min | INTRAVENOUS | Status: DC

## 2016-12-01 MED ORDER — FAT EMULSION 20 % IV EMUL
250.0000 mL | INTRAVENOUS | Status: AC
  Administered 2016-12-01: 250 mL via INTRAVENOUS
  Filled 2016-12-01: qty 250

## 2016-12-01 MED ORDER — POTASSIUM CHLORIDE 10 MEQ/50ML IV SOLN
10.0000 meq | INTRAVENOUS | Status: AC
  Administered 2016-12-01 (×2): 10 meq via INTRAVENOUS
  Filled 2016-12-01 (×2): qty 50
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
## 2016-12-01 NOTE — Plan of Care (Signed)
Problem: Pain Managment: Goal: General experience of comfort will improve Outcome: Progressing Pt on fentanyl and versed with adequate pain management   Problem: Physical Regulation: Goal: Will remain free from infection Wound sites healing as expected  Problem: Skin Integrity: Goal: Risk for impaired skin integrity will decrease Outcome: Progressing Turn Q2hrs, heels off bed  Problem: Activity: Goal: Risk for activity intolerance will decrease Outcome: Progressing Purposeful movement RUE, RLE, LLE. No movement LUE  Problem: Bowel/Gastric: Goal: Will not experience complications related to bowel motility Outcome: Not Progressing No bowel sounds auscultated   Problem: Physical Regulation: Goal: Will regain or maintain usual level of consciousness Outcome: Progressing Responds to voice. Pt able to follow commands with RUE, LLE, RLE.  Goal: Neurologic status will improve Outcome: Progressing Pt able to follow commands with RUE, RLE, LLE  Problem: Respiratory: Goal: Ability to maintain adequate ventilation will improve Outcome: Progressing Pt on full ventilator support   Problem: Skin Integrity: Goal: Risk for impaired skin integrity will decrease Outcome: Progressing Turn Q2hrs, heels off bed  Problem: Psychosocial: Goal: Ability to verbalize positive feelings about self will improve Outcome: Not Progressing Pt nonverbal  Goal: Ability to participate in self-care as condition permits will improve Outcome: Not Progressing Pt dependent with ADLs Goal: Ability to identify appropriate support needs will improve Outcome: Not Progressing Pt nonverbal Goal: Ability to identify strategies to decrease anxiety will improve Outcome: Not Progressing Pt nonverbal Goal: Verbalization of feelings and concerns over difficulty with self-care will improve Outcome: Not Progressing Pt nonverbal  Problem: Nutritional: Goal: Dietary intake will improve Outcome: Progressing Pt on  TPN  Problem: Communication: Goal: Ability to communicate needs accurately will improve Outcome: Not Progressing Pt nonverbal
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
## 2016-12-01 NOTE — Progress Notes (Signed)
Patient ID: Chase Ayala, male   DOB: [birth year], 26 y.o.   MRN: [id number]  he is doing well with respect to the mandible. The wires are tight and occlusion is good. No evidence of infection outside or intraoral. The wires stay for 5 weeks and this is week 3.

## 2016-12-01 NOTE — Progress Notes (Signed)
Follow up - Trauma and Critical Care  Patient Details:    Chase Ayala is an 26 y.o. male.  Lines/tubes : PICC Triple Lumen 11/19/16 PICC Right Brachial 40 cm 0 cm (Active)  Indication for Insertion or Continuance of Line Administration of hyperosmolar/irritating solutions (i.e. TPN, Vancomycin, etc.) 12/01/2016  8:00 AM  Exposed Catheter (cm) 0 cm 11/24/2016  8:00 AM  Site Assessment Clean;Dry;Intact 12/01/2016  8:00 AM  Lumen #1 Status Infusing 12/01/2016  8:00 AM  Lumen #2 Status Infusing 12/01/2016  8:00 AM  Lumen #3 Status Infusing;In-line blood sampling system in place 12/01/2016  8:00 AM  Dressing Type Transparent;Occlusive 12/01/2016  8:00 AM  Dressing Status Clean;Dry;Intact;Antimicrobial disc in place 12/01/2016  8:00 AM  Line Care Connections checked and tightened 12/01/2016  8:00 AM  Dressing Intervention New dressing;Dressing changed;Antimicrobial disc changed 11/26/2016 11:00 PM  Dressing Change Due 12/03/16 12/01/2016  8:00 AM     Negative Pressure Wound Therapy Abdomen (Active)  Last dressing change 11/26/16 11/27/2016  8:00 AM  Site / Wound Assessment Dressing in place / Unable to assess 11/29/2016  8:00 PM  Peri-wound Assessment Intact 11/29/2016  9:00 AM  Cycle Continuous 11/28/2016  8:00 PM  Target Pressure (mmHg) 125 11/28/2016  8:00 PM  Instillation Volume 475 mL 11/21/2016  3:00 AM  Canister Changed No 11/28/2016  8:00 PM  Dressing Status Intact 11/29/2016  9:00 AM  Drainage Amount Minimal 11/29/2016  9:00 AM  Drainage Description Serous;Serosanguineous;Other (Comment) 11/29/2016  9:00 AM  Output (mL) 25 mL 11/30/2016  6:33 AM     NG/OG Tube Nasogastric 16 Fr. Right nare Xray Measured external length of tube (Active)  Site Assessment Clean;Intact;Dry 12/01/2016  4:00 AM  Ongoing Placement Verification No change in respiratory status;No acute changes, not attributed to clinical condition 12/01/2016  4:00 AM  Status Suction-low intermittent 12/01/2016  4:00 AM  Drainage  Appearance Bile;Green 12/01/2016  4:00 AM  Output (mL) 150 mL 12/01/2016  6:00 AM     Urethral Catheter Carilyn Goodpasture, RN Double-lumen 16 Fr. (Active)  Indication for Insertion or Continuance of Catheter Acute urinary retention 12/01/2016  8:00 AM  Site Assessment Clean;Intact 12/01/2016  8:00 AM  Catheter Maintenance Bag below level of bladder;Catheter secured;Drainage bag/tubing not touching floor;Insertion date on drainage bag;No dependent loops;Bag emptied prior to transport 12/01/2016  8:00 AM  Collection Container Standard drainage bag 12/01/2016  8:00 AM  Securement Method Leg strap 12/01/2016  8:00 AM  Urinary Catheter Interventions Unclamped 12/01/2016  8:00 AM  Input (mL) 80 mL 11/18/2016  2:00 PM  Output (mL) 200 mL 12/01/2016  8:00 AM    Microbiology/Sepsis markers: Results for orders placed or performed during the hospital encounter of 11/05/16  Culture, Urine     Status: None   Collection Time: 11/12/16 10:34 AM  Result Value Ref Range Status   Specimen Description URINE, CATHETERIZED  Final   Special Requests NONE  Final   Culture NO GROWTH  Final   Report Status 11/13/2016 FINAL  Final  Culture, respiratory (NON-Expectorated)     Status: Abnormal   Collection Time: 11/12/16 11:11 AM  Result Value Ref Range Status   Specimen Description TRACHEAL ASPIRATE  Final   Special Requests NONE  Final   Gram Stain   Final    RARE SQUAMOUS EPITHELIAL CELLS PRESENT FEW WBC PRESENT,BOTH PMN AND MONONUCLEAR RARE GRAM NEGATIVE RODS RARE GRAM POSITIVE COCCI RARE GRAM NEGATIVE COCCI    Culture MULTIPLE ORGANISMS PRESENT, NONE PREDOMINANT (A)  Final  Report Status 11/14/2016 FINAL  Final  Culture, blood (routine x 2)     Status: None   Collection Time: 11/12/16 11:18 AM  Result Value Ref Range Status   Specimen Description BLOOD LEFT HAND  Final   Special Requests IN PEDIATRIC BOTTLE Blood Culture adequate volume  Final   Culture NO GROWTH 5 DAYS  Final   Report Status 11/17/2016 FINAL   Final  Culture, blood (routine x 2)     Status: None   Collection Time: 11/12/16 11:22 AM  Result Value Ref Range Status   Specimen Description BLOOD LEFT ANTECUBITAL  Final   Special Requests IN PEDIATRIC BOTTLE Blood Culture adequate volume  Final   Culture NO GROWTH 5 DAYS  Final   Report Status 11/17/2016 FINAL  Final  Culture, respiratory (NON-Expectorated)     Status: Abnormal   Collection Time: 11/13/16 12:10 PM  Result Value Ref Range Status   Specimen Description TRACHEAL ASPIRATE  Final   Special Requests Normal  Final   Gram Stain   Final    RARE WBC PRESENT,BOTH PMN AND MONONUCLEAR FEW GRAM VARIABLE ROD RARE GRAM POSITIVE COCCI IN PAIRS    Culture MULTIPLE ORGANISMS PRESENT, NONE PREDOMINANT (A)  Final   Report Status 11/15/2016 FINAL  Final  Culture, blood (Routine X 2) w Reflex to ID Panel     Status: None   Collection Time: 11/13/16 12:40 PM  Result Value Ref Range Status   Specimen Description BLOOD RIGHT ANTECUBITAL  Final   Special Requests IN PEDIATRIC BOTTLE Blood Culture adequate volume  Final   Culture NO GROWTH 5 DAYS  Final   Report Status 11/18/2016 FINAL  Final  Culture, blood (Routine X 2) w Reflex to ID Panel     Status: None   Collection Time: 11/13/16 12:40 PM  Result Value Ref Range Status   Specimen Description BLOOD LEFT ANTECUBITAL  Final   Special Requests IN PEDIATRIC BOTTLE Blood Culture adequate volume  Final   Culture NO GROWTH 5 DAYS  Final   Report Status 11/18/2016 FINAL  Final  Culture, Urine     Status: None   Collection Time: 11/13/16  1:56 PM  Result Value Ref Range Status   Specimen Description URINE, CATHETERIZED  Final   Special Requests Normal  Final   Culture NO GROWTH  Final   Report Status 11/14/2016 FINAL  Final  MRSA PCR Screening     Status: Abnormal   Collection Time: 11/14/16  1:43 PM  Result Value Ref Range Status   MRSA by PCR POSITIVE (A) NEGATIVE Final    Comment:        The GeneXpert MRSA Assay (FDA approved  for NASAL specimens only), is one component of a comprehensive MRSA colonization surveillance program. It is not intended to diagnose MRSA infection nor to guide or monitor treatment for MRSA infections. RESULT CALLED TO, READ BACK BY AND VERIFIED WITH: Audree Bane [id number] 09.01.2018 N. MORRIS   Aerobic/Anaerobic Culture (surgical/deep wound)     Status: None   Collection Time: 11/18/16  4:11 PM  Result Value Ref Range Status   Specimen Description PERITONEAL  Final   Special Requests POF VANC AND ZOSYN  Final   Gram Stain   Final    FEW WBC PRESENT, PREDOMINANTLY MONONUCLEAR ABUNDANT GRAM NEGATIVE RODS RARE GRAM POSITIVE COCCI IN PAIRS RARE GRAM POSITIVE RODS    Culture   Final    ABUNDANT ESCHERICHIA COLI MIXED ANAEROBIC FLORA PRESENT.  CALL LAB IF  FURTHER IID REQUIRED.    Report Status 11/22/2016 FINAL  Final   Organism ID, Bacteria ESCHERICHIA COLI  Final      Susceptibility   Escherichia coli - MIC*    AMPICILLIN 4 SENSITIVE Sensitive     CEFAZOLIN <=4 SENSITIVE Sensitive     CEFEPIME <=1 SENSITIVE Sensitive     CEFTAZIDIME <=1 SENSITIVE Sensitive     CEFTRIAXONE <=1 SENSITIVE Sensitive     CIPROFLOXACIN <=0.25 SENSITIVE Sensitive     GENTAMICIN <=1 SENSITIVE Sensitive     IMIPENEM <=0.25 SENSITIVE Sensitive     TRIMETH/SULFA <=20 SENSITIVE Sensitive     AMPICILLIN/SULBACTAM <=2 SENSITIVE Sensitive     PIP/TAZO <=4 SENSITIVE Sensitive     Extended ESBL NEGATIVE Sensitive     * ABUNDANT ESCHERICHIA COLI  Surgical PCR screen     Status: Abnormal   Collection Time: 11/20/16  5:26 AM  Result Value Ref Range Status   MRSA, PCR POSITIVE (A) NEGATIVE Final    Comment: RESULT CALLED TO, READ BACK BY AND VERIFIED WITH: C. Dupont RN 11:35 11/20/16 ([email or web address])    Staphylococcus aureus POSITIVE (A) NEGATIVE Final    Comment: (NOTE) The Xpert SA Assay (FDA approved for NASAL specimens in patients 57 years of age and older), is one component of a comprehensive surveillance  program. It is not intended to diagnose infection nor to guide or monitor treatment.   Aerobic Culture (superficial specimen)     Status: None (Preliminary result)   Collection Time: 11/30/16 10:40 AM  Result Value Ref Range Status   Specimen Description WOUND ABDOMEN  Final   Special Requests Normal  Final   Gram Stain   Final    DEGENERATED CELLULAR MATERIAL PRESENT FEW GRAM NEGATIVE RODS RARE GRAM VARIABLE ROD    Culture PENDING  Incomplete   Report Status PENDING  Incomplete  Anaerobic culture     Status: None (Preliminary result)   Collection Time: 11/30/16  3:57 PM  Result Value Ref Range Status   Specimen Description WOUND ABDOMEN  Final   Special Requests Normal  Final   Gram Stain   Final    ABUNDANT WBC PRESENT,BOTH PMN AND MONONUCLEAR FEW GRAM VARIABLE ROD FEW GRAM NEGATIVE RODS    Culture PENDING  Incomplete   Report Status PENDING  Incomplete    Anti-infectives:  Anti-infectives    Start     Dose/Rate Route Frequency Ordered Stop   11/29/16 1930  piperacillin-tazobactam (ZOSYN) IVPB 3.375 g     3.375 g 12.5 mL/hr over 240 Minutes Intravenous Every 8 hours 11/29/16 1844     11/17/16 0000  vancomycin (VANCOCIN) 1,500 mg in sodium chloride 0.9 % 500 mL IVPB  Status:  Discontinued     1,500 mg 250 mL/hr over 120 Minutes Intravenous Every 8 hours 11/16/16 1700 11/19/16 0947   11/15/16 1600  vancomycin (VANCOCIN) IVPB 1000 mg/200 mL premix  Status:  Discontinued     1,000 mg 200 mL/hr over 60 Minutes Intravenous Every 8 hours 11/15/16 1332 11/16/16 1700   11/15/16 0130  vancomycin (VANCOCIN) IVPB 1000 mg/200 mL premix  Status:  Discontinued     1,000 mg 200 mL/hr over 60 Minutes Intravenous Every 8 hours 11/14/16 1628 11/15/16 1332   11/14/16 1730  vancomycin (VANCOCIN) 2,000 mg in sodium chloride 0.9 % 500 mL IVPB     2,000 mg 250 mL/hr over 120 Minutes Intravenous  Once 11/14/16 1628 11/14/16 2042   11/13/16 1400  piperacillin-tazobactam (ZOSYN) IVPB 3.375 g  Status:  Discontinued     3.375 g 100 mL/hr over 30 Minutes Intravenous Every 8 hours 11/13/16 1115 11/13/16 1121   11/12/16 1130  piperacillin-tazobactam (ZOSYN) IVPB 3.375 g  Status:  Discontinued     3.375 g 12.5 mL/hr over 240 Minutes Intravenous Every 8 hours 11/12/16 1038 11/24/16 0943   11/05/16 0600  ceFAZolin (ANCEF) IVPB 2g/100 mL premix     2 g 200 mL/hr over 30 Minutes Intravenous  Once 11/05/16 0559 11/05/16 0729      Best Practice/Protocols:  VTE Prophylaxis: Lovenox (prophylaxtic dose) and Mechanical GI Prophylaxis: Proton Pump Inhibitor Continous Sedation IV sedation  Consults: Treatment Team:  Myrene Galas, MD Serena Colonel, MD    Events:  Subjective:    Overnight Issues: No big issues overnight.  To have percutaneous drain placed into pelvic collections today.  Objective:  Vital signs for last 24 hours: Temp:  [97.8 F (36.6 C)-101 F (38.3 C)] 97.8 F (36.6 C) (09/18 0800) Pulse Rate:  [38-103] 60 (09/18 0800) Resp:  [0-32] 16 (09/18 0800) BP: (115-155)/(69-110) 127/80 (09/18 0800) SpO2:  [96 %-100 %] 100 % (09/18 0800) FiO2 (%):  [30 %] 30 % (09/18 0300) Weight:  [90.6 kg (199 lb 11.8 oz)] 90.6 kg (199 lb 11.8 oz) (09/18 0500)  Hemodynamic parameters for last 24 hours:    Intake/Output from previous day: 09/17 0701 - 09/18 0700 In: 3764.3 [I.V.:3764.3] Out: 5495 [WUJWJ:1914; Emesis/NG output:850]  Intake/Output this shift: Total I/O In: -  Out: 200 [Urine:200]  Vent settings for last 24 hours: Vent Mode: PRVC FiO2 (%):  [30 %] 30 % Set Rate:  [16 bmp] 16 bmp Vt Set:  [560 mL] 560 mL PEEP:  [5 cmH20] 5 cmH20 Plateau Pressure:  [22 cmH20-28 cmH20] 28 cmH20  Physical Exam:  General: no respiratory distress and sleepy today. Neuro: nonfocal exam and RASS -1 Resp: clear to auscultation bilaterally CVS: regular rate and rhythm, S1, S2 normal, no murmur, click, rub or gallop GI: Distended with pocket of subfascial fluid collections in  the upper part of the wound.  Not draining as much as yesterday.  Nothing enteric colored. Extremities: Edema in extremities much improved.  Results for orders placed or performed during the hospital encounter of 11/05/16 (from the past 24 hour(s))  Aerobic Culture (superficial specimen)     Status: None (Preliminary result)   Collection Time: 11/30/16 10:40 AM  Result Value Ref Range   Specimen Description WOUND ABDOMEN    Special Requests Normal    Gram Stain      DEGENERATED CELLULAR MATERIAL PRESENT FEW GRAM NEGATIVE RODS RARE GRAM VARIABLE ROD    Culture PENDING    Report Status PENDING   Glucose, capillary     Status: Abnormal   Collection Time: 11/30/16 11:36 AM  Result Value Ref Range   Glucose-Capillary 133 (H) 65 - 99 mg/dL  Protime-INR     Status: Abnormal   Collection Time: 11/30/16  1:56 PM  Result Value Ref Range   Prothrombin Time 15.9 (H) 11.4 - 15.2 seconds   INR 1.29   APTT     Status: None   Collection Time: 11/30/16  1:56 PM  Result Value Ref Range   aPTT 34 24 - 36 seconds  Anaerobic culture     Status: None (Preliminary result)   Collection Time: 11/30/16  3:57 PM  Result Value Ref Range   Specimen Description WOUND ABDOMEN    Special Requests Normal    Gram Stain  ABUNDANT WBC PRESENT,BOTH PMN AND MONONUCLEAR FEW GRAM VARIABLE ROD FEW GRAM NEGATIVE RODS    Culture PENDING    Report Status PENDING   Glucose, capillary     Status: Abnormal   Collection Time: 11/30/16  3:57 PM  Result Value Ref Range   Glucose-Capillary 110 (H) 65 - 99 mg/dL  Glucose, capillary     Status: Abnormal   Collection Time: 11/30/16  8:01 PM  Result Value Ref Range   Glucose-Capillary 116 (H) 65 - 99 mg/dL  Glucose, capillary     Status: Abnormal   Collection Time: 11/30/16 11:23 PM  Result Value Ref Range   Glucose-Capillary 111 (H) 65 - 99 mg/dL  Basic metabolic panel     Status: Abnormal   Collection Time: 12/01/16  5:30 AM  Result Value Ref Range   Sodium  133 (L) 135 - 145 mmol/L   Potassium 3.7 3.5 - 5.1 mmol/L   Chloride 99 (L) 101 - 111 mmol/L   CO2 26 22 - 32 mmol/L   Glucose, Bld 113 (H) 65 - 99 mg/dL   BUN 10 6 - 20 mg/dL   Creatinine, Ser 1.61 (L) 0.61 - 1.24 mg/dL   Calcium 8.0 (L) 8.9 - 10.3 mg/dL   GFR calc non Af Amer >60 >60 mL/min   GFR calc Af Amer >60 >60 mL/min   Anion gap 8 5 - 15  Magnesium     Status: Abnormal   Collection Time: 12/01/16  5:30 AM  Result Value Ref Range   Magnesium 1.5 (L) 1.7 - 2.4 mg/dL  Phosphorus     Status: None   Collection Time: 12/01/16  5:30 AM  Result Value Ref Range   Phosphorus 4.5 2.5 - 4.6 mg/dL  CBC with Differential/Platelet     Status: Abnormal   Collection Time: 12/01/16  5:30 AM  Result Value Ref Range   WBC 10.2 4.0 - 10.5 K/uL   RBC 2.75 (L) 4.22 - 5.81 MIL/uL   Hemoglobin 8.3 (L) 13.0 - 17.0 g/dL   HCT 09.6 (L) 04.5 - 40.9 %   MCV 88.7 78.0 - 100.0 fL   MCH 30.2 26.0 - 34.0 pg   MCHC 34.0 30.0 - 36.0 g/dL   RDW 81.1 (H) 91.4 - 78.2 %   Platelets 517 (H) 150 - 400 K/uL   Neutrophils Relative % 71 %   Lymphocytes Relative 18 %   Monocytes Relative 9 %   Eosinophils Relative 2 %   Basophils Relative 0 %   Neutro Abs 7.3 1.7 - 7.7 K/uL   Lymphs Abs 1.8 0.7 - 4.0 K/uL   Monocytes Absolute 0.9 0.1 - 1.0 K/uL   Eosinophils Absolute 0.2 0.0 - 0.7 K/uL   Basophils Absolute 0.0 0.0 - 0.1 K/uL   RBC Morphology POLYCHROMASIA PRESENT    WBC Morphology VACUOLATED NEUTROPHILS   Glucose, capillary     Status: Abnormal   Collection Time: 12/01/16  8:07 AM  Result Value Ref Range   Glucose-Capillary 122 (H) 65 - 99 mg/dL     Assessment/Plan:   NEURO  Altered Mental Status:  sedation   Plan: Adjustments could be better if we were able to give the patient enteric sedatives  PULM  Atelectasis/collapse (focal)   Plan: Continue to ventilate the patient, wean as tolerated, try to get the patient on trach collar.  CARDIO  No specific issue   Plan: CPM  RENAL  Still  autodiuresing.  Negative fludi balalnce for the last 24 hours.  Plan: CPM  GI  Open wound still draining semifoulsmelling fluid from the subfascial area of th4e upper abdominal wound.  Other areas in the pelvis to be drained today in IR.   Plan: IR for percutaneous drainage of the lower abdominal/pelvic fluid collections.  ID  Abscess of Wound and Intra-Abdominal Abscess (Pelvic)   Plan: CPM.  IR to drain fluid collections today.  Cultures of wound from yesterday are pending.  HEME  Anemia acute blood loss anemia)   Plan: Hemoglobin is up today.  No need for transfusion.  Will not check labs until Thursday.  ENDO No specific issues   Plan: CPM  Global Issues  To go down to IR for percutaneous drainage of intra-abdominal fluid collections.      LOS: 26 days   Additional comments:I reviewed the patient's new clinical lab test results. cbc/bmet and I have discussed and reviewed with family members patient's mother  Critical Care Total Time*: 30 Minutes  Laloni Rowton 12/01/2016  *Care during the described time interval was provided by me and/or other providers on the critical care team.  I have reviewed this patient's available data, including medical history, events of note, physical examination and test results as part of my evaluation.
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
## 2016-12-02 ENCOUNTER — Encounter (HOSPITAL_COMMUNITY): Payer: Self-pay

## 2016-12-02 ENCOUNTER — Inpatient Hospital Stay (HOSPITAL_COMMUNITY): Payer: Self-pay

## 2016-12-02 LAB — GLUCOSE, CAPILLARY
GLUCOSE-CAPILLARY: 103 mg/dL — AB (ref 65–99)
GLUCOSE-CAPILLARY: 111 mg/dL — AB (ref 65–99)
Glucose-Capillary: 126 mg/dL — ABNORMAL HIGH (ref 65–99)
Glucose-Capillary: 115 mg/dL — ABNORMAL HIGH (ref 65–99)
Glucose-Capillary: 124 mg/dL — ABNORMAL HIGH (ref 65–99)
Glucose-Capillary: 137 mg/dL — ABNORMAL HIGH (ref 65–99)
Glucose-Capillary: 96 mg/dL (ref 65–99)

## 2016-12-02 LAB — AEROBIC CULTURE W GRAM STAIN (SUPERFICIAL SPECIMEN): Special Requests: NORMAL

## 2016-12-02 LAB — AEROBIC CULTURE  (SUPERFICIAL SPECIMEN)

## 2016-12-02 LAB — PHOSPHORUS: PHOSPHORUS: 5.1 mg/dL — AB (ref 2.5–4.6)

## 2016-12-02 LAB — MAGNESIUM: MAGNESIUM: 1.4 mg/dL — AB (ref 1.7–2.4)

## 2016-12-02 MED ORDER — SODIUM CHLORIDE 0.9% FLUSH
10.0000 mL | Freq: Three times a day (TID) | INTRAVENOUS | Status: DC
  Administered 2016-12-02 – 2016-12-11 (×18): 10 mL via INTRAVENOUS

## 2016-12-02 MED ORDER — MIDAZOLAM HCL 2 MG/2ML IJ SOLN
INTRAMUSCULAR | Status: DC | PRN
  Administered 2016-12-02 (×2): 1 mg via INTRAVENOUS

## 2016-12-02 MED ORDER — LIDOCAINE HCL 1 % IJ SOLN
INTRAMUSCULAR | Status: AC
  Filled 2016-12-02: qty 20

## 2016-12-02 MED ORDER — MAGNESIUM SULFATE 4 GM/100ML IV SOLN
4.0000 g | Freq: Once | INTRAVENOUS | Status: AC
  Administered 2016-12-02: 4 g via INTRAVENOUS
  Filled 2016-12-02: qty 100

## 2016-12-02 MED ORDER — TRACE MINERALS CR-CU-MN-SE-ZN 10-1000-500-60 MCG/ML IV SOLN
INTRAVENOUS | Status: AC
  Administered 2016-12-02: 19:00:00 via INTRAVENOUS
  Filled 2016-12-02: qty 2640

## 2016-12-02 MED ORDER — FAT EMULSION 20 % IV EMUL
240.0000 mL | INTRAVENOUS | Status: AC
  Administered 2016-12-02: 240 mL via INTRAVENOUS
  Filled 2016-12-02: qty 250

## 2016-12-02 MED ORDER — POTASSIUM CHLORIDE 10 MEQ/50ML IV SOLN
10.0000 meq | INTRAVENOUS | Status: AC
  Administered 2016-12-02 (×4): 10 meq via INTRAVENOUS
  Filled 2016-12-02 (×4): qty 50

## 2016-12-02 MED ORDER — MIDAZOLAM HCL 2 MG/2ML IJ SOLN
INTRAMUSCULAR | Status: AC
  Filled 2016-12-02: qty 4

## 2016-12-02 MED ORDER — SODIUM CHLORIDE 0.9 % IV SOLN
1.0000 g | INTRAVENOUS | Status: AC
  Administered 2016-12-02 – 2016-12-11 (×10): 1 g via INTRAVENOUS
  Filled 2016-12-02 (×10): qty 1

## 2016-12-02 MED ORDER — FENTANYL CITRATE (PF) 100 MCG/2ML IJ SOLN
INTRAMUSCULAR | Status: AC
  Filled 2016-12-02: qty 4

## 2016-12-02 MED ORDER — FENTANYL CITRATE (PF) 100 MCG/2ML IJ SOLN
INTRAMUSCULAR | Status: DC | PRN
  Administered 2016-12-02 (×2): 50 ug via INTRAVENOUS

## 2016-12-02 NOTE — Procedures (Signed)
Interventional Radiology Procedure Note  Procedure: CT guided drainage of LLQ pelvic fluid collection  Complications: None  Estimated Blood Loss: None  Initial CT shows clear improvement in peritoneal fluid collections since 9/16 scan.  Collection in lower left pelvis just above bladder now 2 x 3 cm (previously 3.4 x 5.3).  Persistent fluid in LLQ in upper pelvis.  10 Fr drain placed with return of thin, serosanguinous fluid.  Sample sent for culture.  Drain connected to suction bulb.  Jodi Marble. Fredia Sorrow, M.D Pager:  [phone]
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
## 2016-12-02 NOTE — Progress Notes (Signed)
Follow up - Trauma and Critical Care  Patient Details:    Chase Ayala is an 26 y.o. male.  Lines/tubes : PICC Triple Lumen 11/19/16 PICC Right Brachial 40 cm 0 cm (Active)  Indication for Insertion or Continuance of Line Prolonged intravenous therapies;Administration of hyperosmolar/irritating solutions (i.e. TPN, Vancomycin, etc.) 12/01/2016 10:00 PM  Exposed Catheter (cm) 0 cm 11/24/2016  8:00 AM  Site Assessment Clean;Dry;Intact 12/01/2016 10:00 PM  Lumen #1 Status Infusing;Cap changed 12/01/2016 10:00 PM  Lumen #2 Status Infusing 12/01/2016 10:00 PM  Lumen #3 Status Infusing;In-line blood sampling system in place 12/01/2016 10:00 PM  Dressing Type Transparent 12/01/2016 10:00 PM  Dressing Status Clean;Dry;Intact;Antimicrobial disc in place 12/01/2016 10:00 PM  Line Care Connections checked and tightened 12/01/2016 10:00 PM  Dressing Intervention New dressing;Dressing changed;Antimicrobial disc changed 11/26/2016 11:00 PM  Dressing Change Due 12/03/16 12/01/2016 10:00 PM     Negative Pressure Wound Therapy Abdomen (Active)  Last dressing change 11/26/16 11/27/2016  8:00 AM  Site / Wound Assessment Dressing in place / Unable to assess 11/29/2016  8:00 PM  Peri-wound Assessment Intact 11/29/2016  9:00 AM  Cycle Continuous 11/28/2016  8:00 PM  Target Pressure (mmHg) 125 11/28/2016  8:00 PM  Instillation Volume 475 mL 11/21/2016  3:00 AM  Canister Changed No 11/28/2016  8:00 PM  Dressing Status Intact 11/29/2016  9:00 AM  Drainage Amount Minimal 11/29/2016  9:00 AM  Drainage Description Serous;Serosanguineous;Other (Comment) 11/29/2016  9:00 AM  Output (mL) 25 mL 11/30/2016  6:33 AM     NG/OG Tube Nasogastric 16 Fr. Right nare Xray Measured external length of tube (Active)  Site Assessment Clean;Intact;Dry 12/02/2016  4:00 AM  Ongoing Placement Verification No change in respiratory status;No acute changes, not attributed to clinical condition 12/02/2016  4:00 AM  Status Suction-low intermittent  12/02/2016  4:00 AM  Drainage Appearance Bile;Green 12/02/2016  4:00 AM  Output (mL) 75 mL 12/02/2016  5:32 AM     Urethral Catheter Carilyn Goodpasture, RN Double-lumen 16 Fr. (Active)  Indication for Insertion or Continuance of Catheter Acute urinary retention 12/02/2016  4:00 AM  Site Assessment Clean;Intact 12/02/2016  4:00 AM  Catheter Maintenance Bag below level of bladder;Catheter secured;Drainage bag/tubing not touching floor;Insertion date on drainage bag;No dependent loops;Bag emptied prior to transport 12/02/2016  4:00 AM  Collection Container Standard drainage bag 12/02/2016  4:00 AM  Securement Method Leg strap 12/02/2016  4:00 AM  Urinary Catheter Interventions Unclamped 12/02/2016  4:00 AM  Input (mL) 80 mL 11/18/2016  2:00 PM  Output (mL) 550 mL 12/02/2016 10:00 AM    Microbiology/Sepsis markers: Results for orders placed or performed during the hospital encounter of 11/05/16  Culture, Urine     Status: None   Collection Time: 11/12/16 10:34 AM  Result Value Ref Range Status   Specimen Description URINE, CATHETERIZED  Final   Special Requests NONE  Final   Culture NO GROWTH  Final   Report Status 11/13/2016 FINAL  Final  Culture, respiratory (NON-Expectorated)     Status: Abnormal   Collection Time: 11/12/16 11:11 AM  Result Value Ref Range Status   Specimen Description TRACHEAL ASPIRATE  Final   Special Requests NONE  Final   Gram Stain   Final    RARE SQUAMOUS EPITHELIAL CELLS PRESENT FEW WBC PRESENT,BOTH PMN AND MONONUCLEAR RARE GRAM NEGATIVE RODS RARE GRAM POSITIVE COCCI RARE GRAM NEGATIVE COCCI    Culture MULTIPLE ORGANISMS PRESENT, NONE PREDOMINANT (A)  Final   Report Status 11/14/2016 FINAL  Final  Culture, blood (routine x 2)     Status: None   Collection Time: 11/12/16 11:18 AM  Result Value Ref Range Status   Specimen Description BLOOD LEFT HAND  Final   Special Requests IN PEDIATRIC BOTTLE Blood Culture adequate volume  Final   Culture NO GROWTH 5 DAYS  Final    Report Status 11/17/2016 FINAL  Final  Culture, blood (routine x 2)     Status: None   Collection Time: 11/12/16 11:22 AM  Result Value Ref Range Status   Specimen Description BLOOD LEFT ANTECUBITAL  Final   Special Requests IN PEDIATRIC BOTTLE Blood Culture adequate volume  Final   Culture NO GROWTH 5 DAYS  Final   Report Status 11/17/2016 FINAL  Final  Culture, respiratory (NON-Expectorated)     Status: Abnormal   Collection Time: 11/13/16 12:10 PM  Result Value Ref Range Status   Specimen Description TRACHEAL ASPIRATE  Final   Special Requests Normal  Final   Gram Stain   Final    RARE WBC PRESENT,BOTH PMN AND MONONUCLEAR FEW GRAM VARIABLE ROD RARE GRAM POSITIVE COCCI IN PAIRS    Culture MULTIPLE ORGANISMS PRESENT, NONE PREDOMINANT (A)  Final   Report Status 11/15/2016 FINAL  Final  Culture, blood (Routine X 2) w Reflex to ID Panel     Status: None   Collection Time: 11/13/16 12:40 PM  Result Value Ref Range Status   Specimen Description BLOOD RIGHT ANTECUBITAL  Final   Special Requests IN PEDIATRIC BOTTLE Blood Culture adequate volume  Final   Culture NO GROWTH 5 DAYS  Final   Report Status 11/18/2016 FINAL  Final  Culture, blood (Routine X 2) w Reflex to ID Panel     Status: None   Collection Time: 11/13/16 12:40 PM  Result Value Ref Range Status   Specimen Description BLOOD LEFT ANTECUBITAL  Final   Special Requests IN PEDIATRIC BOTTLE Blood Culture adequate volume  Final   Culture NO GROWTH 5 DAYS  Final   Report Status 11/18/2016 FINAL  Final  Culture, Urine     Status: None   Collection Time: 11/13/16  1:56 PM  Result Value Ref Range Status   Specimen Description URINE, CATHETERIZED  Final   Special Requests Normal  Final   Culture NO GROWTH  Final   Report Status 11/14/2016 FINAL  Final  MRSA PCR Screening     Status: Abnormal   Collection Time: 11/14/16  1:43 PM  Result Value Ref Range Status   MRSA by PCR POSITIVE (A) NEGATIVE Final    Comment:        The  GeneXpert MRSA Assay (FDA approved for NASAL specimens only), is one component of a comprehensive MRSA colonization surveillance program. It is not intended to diagnose MRSA infection nor to guide or monitor treatment for MRSA infections. RESULT CALLED TO, READ BACK BY AND VERIFIED WITH: Audree Bane [id number] 09.01.2018 N. MORRIS   Aerobic/Anaerobic Culture (surgical/deep wound)     Status: None   Collection Time: 11/18/16  4:11 PM  Result Value Ref Range Status   Specimen Description PERITONEAL  Final   Special Requests POF VANC AND ZOSYN  Final   Gram Stain   Final    FEW WBC PRESENT, PREDOMINANTLY MONONUCLEAR ABUNDANT GRAM NEGATIVE RODS RARE GRAM POSITIVE COCCI IN PAIRS RARE GRAM POSITIVE RODS    Culture   Final    ABUNDANT ESCHERICHIA COLI MIXED ANAEROBIC FLORA PRESENT.  CALL LAB IF FURTHER IID REQUIRED.    Report  Status 11/22/2016 FINAL  Final   Organism ID, Bacteria ESCHERICHIA COLI  Final      Susceptibility   Escherichia coli - MIC*    AMPICILLIN 4 SENSITIVE Sensitive     CEFAZOLIN <=4 SENSITIVE Sensitive     CEFEPIME <=1 SENSITIVE Sensitive     CEFTAZIDIME <=1 SENSITIVE Sensitive     CEFTRIAXONE <=1 SENSITIVE Sensitive     CIPROFLOXACIN <=0.25 SENSITIVE Sensitive     GENTAMICIN <=1 SENSITIVE Sensitive     IMIPENEM <=0.25 SENSITIVE Sensitive     TRIMETH/SULFA <=20 SENSITIVE Sensitive     AMPICILLIN/SULBACTAM <=2 SENSITIVE Sensitive     PIP/TAZO <=4 SENSITIVE Sensitive     Extended ESBL NEGATIVE Sensitive     * ABUNDANT ESCHERICHIA COLI  Surgical PCR screen     Status: Abnormal   Collection Time: 11/20/16  5:26 AM  Result Value Ref Range Status   MRSA, PCR POSITIVE (A) NEGATIVE Final    Comment: RESULT CALLED TO, READ BACK BY AND VERIFIED WITH: C. Dupont RN 11:35 11/20/16 ([email or web address])    Staphylococcus aureus POSITIVE (A) NEGATIVE Final    Comment: (NOTE) The Xpert SA Assay (FDA approved for NASAL specimens in patients 41 years of age and older), is one component of  a comprehensive surveillance program. It is not intended to diagnose infection nor to guide or monitor treatment.   Aerobic Culture (superficial specimen)     Status: None   Collection Time: 11/30/16 10:40 AM  Result Value Ref Range Status   Specimen Description WOUND ABDOMEN  Final   Special Requests Normal  Final   Gram Stain   Final    DEGENERATED CELLULAR MATERIAL PRESENT FEW GRAM NEGATIVE RODS RARE GRAM VARIABLE ROD    Culture   Final    MODERATE KLEBSIELLA OXYTOCA MODERATE ESCHERICHIA COLI    Report Status 12/02/2016 FINAL  Final   Organism ID, Bacteria KLEBSIELLA OXYTOCA  Final   Organism ID, Bacteria ESCHERICHIA COLI  Final      Susceptibility   Escherichia coli - MIC*    AMPICILLIN >=32 RESISTANT Resistant     CEFAZOLIN >=64 RESISTANT Resistant     CEFEPIME <=1 SENSITIVE Sensitive     CEFTAZIDIME >=64 RESISTANT Resistant     CEFTRIAXONE >=64 RESISTANT Resistant     CIPROFLOXACIN <=0.25 SENSITIVE Sensitive     GENTAMICIN <=1 SENSITIVE Sensitive     IMIPENEM 0.5 SENSITIVE Sensitive     TRIMETH/SULFA <=20 SENSITIVE Sensitive     AMPICILLIN/SULBACTAM >=32 RESISTANT Resistant     PIP/TAZO >=128 RESISTANT Resistant     Extended ESBL NEGATIVE Sensitive     * MODERATE ESCHERICHIA COLI   Klebsiella oxytoca - MIC*    AMPICILLIN >=32 RESISTANT Resistant     CEFAZOLIN 16 SENSITIVE Sensitive     CEFEPIME <=1 SENSITIVE Sensitive     CEFTAZIDIME <=1 SENSITIVE Sensitive     CEFTRIAXONE <=1 SENSITIVE Sensitive     CIPROFLOXACIN <=0.25 SENSITIVE Sensitive     GENTAMICIN <=1 SENSITIVE Sensitive     IMIPENEM <=0.25 SENSITIVE Sensitive     TRIMETH/SULFA <=20 SENSITIVE Sensitive     AMPICILLIN/SULBACTAM 8 SENSITIVE Sensitive     PIP/TAZO <=4 SENSITIVE Sensitive     Extended ESBL NEGATIVE Sensitive     * MODERATE KLEBSIELLA OXYTOCA  Anaerobic culture     Status: None (Preliminary result)   Collection Time: 11/30/16  3:57 PM  Result Value Ref Range Status   Specimen Description  WOUND ABDOMEN  Final   Special Requests  Normal  Final   Gram Stain   Final    ABUNDANT WBC PRESENT,BOTH PMN AND MONONUCLEAR FEW GRAM VARIABLE ROD FEW GRAM NEGATIVE RODS    Culture   Final    MIXED ANAEROBIC FLORA PRESENT.  CALL LAB IF FURTHER IID REQUIRED.   Report Status PENDING  Incomplete    Anti-infectives:  Anti-infectives    Start     Dose/Rate Route Frequency Ordered Stop   11/29/16 1930  piperacillin-tazobactam (ZOSYN) IVPB 3.375 g     3.375 g 12.5 mL/hr over 240 Minutes Intravenous Every 8 hours 11/29/16 1844     11/17/16 0000  vancomycin (VANCOCIN) 1,500 mg in sodium chloride 0.9 % 500 mL IVPB  Status:  Discontinued     1,500 mg 250 mL/hr over 120 Minutes Intravenous Every 8 hours 11/16/16 1700 11/19/16 0947   11/15/16 1600  vancomycin (VANCOCIN) IVPB 1000 mg/200 mL premix  Status:  Discontinued     1,000 mg 200 mL/hr over 60 Minutes Intravenous Every 8 hours 11/15/16 1332 11/16/16 1700   11/15/16 0130  vancomycin (VANCOCIN) IVPB 1000 mg/200 mL premix  Status:  Discontinued     1,000 mg 200 mL/hr over 60 Minutes Intravenous Every 8 hours 11/14/16 1628 11/15/16 1332   11/14/16 1730  vancomycin (VANCOCIN) 2,000 mg in sodium chloride 0.9 % 500 mL IVPB     2,000 mg 250 mL/hr over 120 Minutes Intravenous  Once 11/14/16 1628 11/14/16 2042   11/13/16 1400  piperacillin-tazobactam (ZOSYN) IVPB 3.375 g  Status:  Discontinued     3.375 g 100 mL/hr over 30 Minutes Intravenous Every 8 hours 11/13/16 1115 11/13/16 1121   11/12/16 1130  piperacillin-tazobactam (ZOSYN) IVPB 3.375 g  Status:  Discontinued     3.375 g 12.5 mL/hr over 240 Minutes Intravenous Every 8 hours 11/12/16 1038 11/24/16 0943   11/05/16 0600  ceFAZolin (ANCEF) IVPB 2g/100 mL premix     2 g 200 mL/hr over 30 Minutes Intravenous  Once 11/05/16 0559 11/05/16 4742      Best Practice/Protocols:  VTE Prophylaxis: Lovenox (prophylaxtic dose) and Mechanical GI Prophylaxis: Proton Pump Inhibitor Intermittent  Sedation  Consults: Treatment Team:  Myrene Galas, MD Serena Colonel, MD    Events:  Subjective:    Overnight Issues: Did not get percutaneous drain yesterday.  Mother and staff not very happy about that.  Objective:  Vital signs for last 24 hours: Temp:  [98.3 F (36.8 C)-100.1 F (37.8 C)] 98.8 F (37.1 C) (09/19 0800) Pulse Rate:  [52-107] 90 (09/19 0858) Resp:  [0-28] 21 (09/19 0858) BP: (120-148)/(69-120) 132/83 (09/19 0800) SpO2:  [95 %-100 %] 100 % (09/19 0858) FiO2 (%):  [30 %] 30 % (09/19 0858) Weight:  [87.8 kg (193 lb 9 oz)] 87.8 kg (193 lb 9 oz) (09/19 0437)  Hemodynamic parameters for last 24 hours:    Intake/Output from previous day: 09/18 0701 - 09/19 0700 In: 4710.4 [I.V.:4660.4; IV Piggyback:50] Out: 4475 [Urine:3500; Emesis/NG output:975]  Intake/Output this shift: Total I/O In: -  Out: 550 [Urine:550]  Vent settings for last 24 hours: Vent Mode: PRVC FiO2 (%):  [30 %] 30 % Set Rate:  [16 bmp] 16 bmp Vt Set:  [560 mL] 560 mL PEEP:  [5 cmH20] 5 cmH20 Plateau Pressure:  [26 cmH20-29 cmH20] 28 cmH20  Physical Exam:  General: alert, no respiratory distress and Not following a lot of commands this morning. Neuro: alert, nonfocal exam and RASS 0 Resp: clear to auscultation bilaterally CVS: regular rate and rhythm,  S1, S2 normal, no murmur, click, rub or gallop GI: Still draining from the open wound in the upper part of the wound where the fascia is partially necrotic Extremities: no edema, no erythema, pulses WNL  Results for orders placed or performed during the hospital encounter of 11/05/16 (from the past 24 hour(s))  Glucose, capillary     Status: Abnormal   Collection Time: 12/01/16  3:48 PM  Result Value Ref Range   Glucose-Capillary 103 (H) 65 - 99 mg/dL   Comment 1 Notify RN    Comment 2 Document in Chart   Glucose, capillary     Status: Abnormal   Collection Time: 12/01/16  8:15 PM  Result Value Ref Range   Glucose-Capillary 114  (H) 65 - 99 mg/dL  Triglycerides     Status: Abnormal   Collection Time: 12/01/16 10:10 PM  Result Value Ref Range   Triglycerides 277 (H) <150 mg/dL  Glucose, capillary     Status: Abnormal   Collection Time: 12/02/16 12:07 AM  Result Value Ref Range   Glucose-Capillary 111 (H) 65 - 99 mg/dL  Glucose, capillary     Status: Abnormal   Collection Time: 12/02/16  4:34 AM  Result Value Ref Range   Glucose-Capillary 126 (H) 65 - 99 mg/dL  Magnesium     Status: Abnormal   Collection Time: 12/02/16  5:00 AM  Result Value Ref Range   Magnesium 1.4 (L) 1.7 - 2.4 mg/dL  Phosphorus     Status: Abnormal   Collection Time: 12/02/16  5:00 AM  Result Value Ref Range   Phosphorus 5.1 (H) 2.5 - 4.6 mg/dL  Glucose, capillary     Status: Abnormal   Collection Time: 12/02/16  8:34 AM  Result Value Ref Range   Glucose-Capillary 115 (H) 65 - 99 mg/dL     Assessment/Plan:   NEURO  Altered Mental Status:  agitation and sedation   Plan: Wean and adjust sedation as appropriate for weaning from the vent after procedures done today.  PULM  No specific changes.   Plan: Will try to get him weaned to trach collar after procedure today.  CARDIO  No significnat issues   Plan: CPM  RENAL  Urine output and renal functiion has been good   Plan: CPM  GI  No specific issues   Plan: CPM.  Not passing gas yet.  No bowel movement.  Only 975 out of NGT in the last 24 hours.  May be able to clamp the tube soon.  ID  Wound culture growing out Klebsiella and E. coli   Plan: Will start Invanz today.  HEME  Did not check CBC today.  Has had stable hemoglobin.  Will check tomorrow.   Plan: CPM  ENDO No specific issues   Plan: CPM  Global Issues  Patient doing well and about to go down for percutaneous drainage of pelvic abscess.  E.coli and Klebsiella growing from abdominal wound.  Will starte Invanz.  NGT output not that high and may be able to start to clamp the NGT tomorrow.    LOS: 27 days    Additional comments:I have discussed and reviewed with family members patient's Spoke with the mother again today.  Critical Care Total Time*: 33 minutes  Peyton Rossner 12/02/2016  *Care during the described time interval was provided by me and/or other providers on the critical care team.  I have reviewed this patient's available data, including medical history, events of note, physical examination and test results as  part of my evaluation.

## 2016-12-03 LAB — CBC WITH DIFFERENTIAL/PLATELET
Basophils Absolute: 0 10*3/uL (ref 0.0–0.1)
Basophils Relative: 0 %
EOS ABS: 0.2 10*3/uL (ref 0.0–0.7)
EOS PCT: 2 %
HCT: 24.1 % — ABNORMAL LOW (ref 39.0–52.0)
Hemoglobin: 7.9 g/dL — ABNORMAL LOW (ref 13.0–17.0)
Lymphocytes Relative: 14 %
Lymphs Abs: 1.5 10*3/uL (ref 0.7–4.0)
MCH: 29.4 pg (ref 26.0–34.0)
MCHC: 32.8 g/dL (ref 30.0–36.0)
MCV: 89.6 fL (ref 78.0–100.0)
MONO ABS: 1 10*3/uL (ref 0.1–1.0)
Monocytes Relative: 9 %
Neutro Abs: 8.3 10*3/uL — ABNORMAL HIGH (ref 1.7–7.7)
Neutrophils Relative %: 75 %
PLATELETS: 579 10*3/uL — AB (ref 150–400)
RBC: 2.69 MIL/uL — AB (ref 4.22–5.81)
RDW: 15.4 % (ref 11.5–15.5)
WBC: 11 10*3/uL — AB (ref 4.0–10.5)

## 2016-12-03 LAB — COMPREHENSIVE METABOLIC PANEL
ALT: 32 U/L (ref 17–63)
ANION GAP: 7 (ref 5–15)
AST: 30 U/L (ref 15–41)
Albumin: 1.6 g/dL — ABNORMAL LOW (ref 3.5–5.0)
Alkaline Phosphatase: 175 U/L — ABNORMAL HIGH (ref 38–126)
BUN: 10 mg/dL (ref 6–20)
CALCIUM: 7.9 mg/dL — AB (ref 8.9–10.3)
CHLORIDE: 100 mmol/L — AB (ref 101–111)
CO2: 26 mmol/L (ref 22–32)
CREATININE: 0.57 mg/dL — AB (ref 0.61–1.24)
GFR calc non Af Amer: >60 mL/min (ref >60)
Glucose, Bld: 132 mg/dL — ABNORMAL HIGH (ref 65–99)
POTASSIUM: 4 mmol/L (ref 3.5–5.1)
SODIUM: 133 mmol/L — AB (ref 135–145)
TOTAL PROTEIN: 6.2 g/dL — AB (ref 6.5–8.1)
Total Bilirubin: 1.1 mg/dL (ref 0.3–1.2)

## 2016-12-03 LAB — GLUCOSE, CAPILLARY
GLUCOSE-CAPILLARY: 107 mg/dL — AB (ref 65–99)
GLUCOSE-CAPILLARY: 124 mg/dL — AB (ref 65–99)
GLUCOSE-CAPILLARY: 117 mg/dL — AB (ref 65–99)
Glucose-Capillary: 105 mg/dL — ABNORMAL HIGH (ref 65–99)
Glucose-Capillary: 115 mg/dL — ABNORMAL HIGH (ref 65–99)
Glucose-Capillary: 129 mg/dL — ABNORMAL HIGH (ref 65–99)

## 2016-12-03 LAB — PHOSPHORUS: PHOSPHORUS: 4.1 mg/dL (ref 2.5–4.6)

## 2016-12-03 LAB — MAGNESIUM: Magnesium: 1.4 mg/dL — ABNORMAL LOW (ref 1.7–2.4)

## 2016-12-03 MED ORDER — ALTEPLASE 2 MG IJ SOLR
2.0000 mg | Freq: Once | INTRAMUSCULAR | Status: AC
  Administered 2016-12-03: 2 mg

## 2016-12-03 MED ORDER — MAGNESIUM SULFATE 4 GM/100ML IV SOLN
4.0000 g | Freq: Once | INTRAVENOUS | Status: AC
  Administered 2016-12-03: 4 g via INTRAVENOUS
  Filled 2016-12-03: qty 100

## 2016-12-03 MED ORDER — TRACE MINERALS CR-CU-MN-SE-ZN 10-1000-500-60 MCG/ML IV SOLN: INTRAVENOUS | Status: DC

## 2016-12-03 MED ORDER — CLINIMIX E/DEXTROSE (5/15) 5 % IV SOLN
INTRAVENOUS | Status: AC
  Administered 2016-12-03: 17:00:00 via INTRAVENOUS
  Filled 2016-12-03: qty 2640

## 2016-12-03 MED ORDER — FAT EMULSION 20 % IV EMUL
250.0000 mL | INTRAVENOUS | Status: AC
  Administered 2016-12-03: 250 mL via INTRAVENOUS
  Filled 2016-12-03: qty 250

## 2016-12-03 MED ORDER — FAT EMULSION 20 % IV EMUL: 240.0000 mL | INTRAVENOUS | Status: DC
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
## 2016-12-03 NOTE — Progress Notes (Signed)
Pt continuously trying to exit bed, needing continuous physical repositioning to keep safe. MD notified. Sedative gtts are maxed out. Low bed ordered, floor mat placed on ground, ASAP safety sitter ordered.

## 2016-12-03 NOTE — Progress Notes (Signed)
Follow up - Trauma Critical Care  Patient Details:    Chase Ayala is an 26 y.o. male.  Lines/tubes : PICC Triple Lumen 11/19/16 PICC Right Brachial 40 cm 0 cm (Active)  Indication for Insertion or Continuance of Line Administration of hyperosmolar/irritating solutions (i.e. TPN, Vancomycin, etc.);Prolonged intravenous therapies 12/02/2016  8:00 PM  Exposed Catheter (cm) 0 cm 11/24/2016  8:00 AM  Site Assessment Clean;Dry;Intact 12/02/2016  8:00 PM  Lumen #1 Status Infusing 12/02/2016  8:00 PM  Lumen #2 Status Infusing 12/02/2016  8:00 PM  Lumen #3 Status Infusing 12/02/2016  8:00 PM  Dressing Type Transparent 12/02/2016  8:00 PM  Dressing Status Clean;Dry;Intact 12/02/2016  8:00 PM  Line Care Connections checked and tightened 12/02/2016  8:00 PM  Dressing Intervention Other (Comment) 12/02/2016  8:00 PM  Dressing Change Due 12/03/16 12/02/2016  8:00 PM     Closed System Drain 1 Left Abdomen Bulb (JP) 10 Fr. (Active)  Site Description Unable to view 12/02/2016  8:00 PM  Dressing Status Clean;Dry;Intact 12/02/2016  8:00 PM  Drainage Appearance Serosanguineous;Milky 12/02/2016  8:00 PM  Status To suction (Charged) 12/02/2016  8:00 PM  Output (mL) 5 mL 12/03/2016 10:00 AM     NG/OG Tube Nasogastric Left nare Xray (Active)  Site Assessment Clean;Dry;Intact 12/02/2016  8:00 PM  Ongoing Placement Verification No change in cm markings or external length of tube from initial placement;No change in respiratory status;No acute changes, not attributed to clinical condition;Xray 12/02/2016  8:00 PM  Status Suction-low intermittent 12/02/2016  8:00 PM  Drainage Appearance Green;Brown 12/02/2016  8:00 PM  Output (mL) 200 mL 12/03/2016 10:00 AM     Urethral Catheter Carilyn Goodpasture, RN Double-lumen 16 Fr. (Active)  Indication for Insertion or Continuance of Catheter Acute urinary retention 12/02/2016  8:00 PM  Site Assessment Clean;Intact 12/02/2016  8:00 PM  Catheter Maintenance Bag below level of  bladder;Catheter secured;Drainage bag/tubing not touching floor;No dependent loops;Seal intact;Insertion date on drainage bag 12/02/2016  8:00 PM  Collection Container Standard drainage bag 12/02/2016  8:00 PM  Securement Method Securing device (Describe) 12/02/2016  8:00 PM  Urinary Catheter Interventions Unclamped 12/02/2016  8:00 PM  Input (mL) 80 mL 11/18/2016  2:00 PM  Output (mL) 500 mL 12/03/2016 10:00 AM    Microbiology/Sepsis markers: Results for orders placed or performed during the hospital encounter of 11/05/16  Culture, Urine     Status: None   Collection Time: 11/12/16 10:34 AM  Result Value Ref Range Status   Specimen Description URINE, CATHETERIZED  Final   Special Requests NONE  Final   Culture NO GROWTH  Final   Report Status 11/13/2016 FINAL  Final  Culture, respiratory (NON-Expectorated)     Status: Abnormal   Collection Time: 11/12/16 11:11 AM  Result Value Ref Range Status   Specimen Description TRACHEAL ASPIRATE  Final   Special Requests NONE  Final   Gram Stain   Final    RARE SQUAMOUS EPITHELIAL CELLS PRESENT FEW WBC PRESENT,BOTH PMN AND MONONUCLEAR RARE GRAM NEGATIVE RODS RARE GRAM POSITIVE COCCI RARE GRAM NEGATIVE COCCI    Culture MULTIPLE ORGANISMS PRESENT, NONE PREDOMINANT (A)  Final   Report Status 11/14/2016 FINAL  Final  Culture, blood (routine x 2)     Status: None   Collection Time: 11/12/16 11:18 AM  Result Value Ref Range Status   Specimen Description BLOOD LEFT HAND  Final   Special Requests IN PEDIATRIC BOTTLE Blood Culture adequate volume  Final   Culture NO GROWTH 5  DAYS  Final   Report Status 11/17/2016 FINAL  Final  Culture, blood (routine x 2)     Status: None   Collection Time: 11/12/16 11:22 AM  Result Value Ref Range Status   Specimen Description BLOOD LEFT ANTECUBITAL  Final   Special Requests IN PEDIATRIC BOTTLE Blood Culture adequate volume  Final   Culture NO GROWTH 5 DAYS  Final   Report Status 11/17/2016 FINAL  Final  Culture,  respiratory (NON-Expectorated)     Status: Abnormal   Collection Time: 11/13/16 12:10 PM  Result Value Ref Range Status   Specimen Description TRACHEAL ASPIRATE  Final   Special Requests Normal  Final   Gram Stain   Final    RARE WBC PRESENT,BOTH PMN AND MONONUCLEAR FEW GRAM VARIABLE ROD RARE GRAM POSITIVE COCCI IN PAIRS    Culture MULTIPLE ORGANISMS PRESENT, NONE PREDOMINANT (A)  Final   Report Status 11/15/2016 FINAL  Final  Culture, blood (Routine X 2) w Reflex to ID Panel     Status: None   Collection Time: 11/13/16 12:40 PM  Result Value Ref Range Status   Specimen Description BLOOD RIGHT ANTECUBITAL  Final   Special Requests IN PEDIATRIC BOTTLE Blood Culture adequate volume  Final   Culture NO GROWTH 5 DAYS  Final   Report Status 11/18/2016 FINAL  Final  Culture, blood (Routine X 2) w Reflex to ID Panel     Status: None   Collection Time: 11/13/16 12:40 PM  Result Value Ref Range Status   Specimen Description BLOOD LEFT ANTECUBITAL  Final   Special Requests IN PEDIATRIC BOTTLE Blood Culture adequate volume  Final   Culture NO GROWTH 5 DAYS  Final   Report Status 11/18/2016 FINAL  Final  Culture, Urine     Status: None   Collection Time: 11/13/16  1:56 PM  Result Value Ref Range Status   Specimen Description URINE, CATHETERIZED  Final   Special Requests Normal  Final   Culture NO GROWTH  Final   Report Status 11/14/2016 FINAL  Final  MRSA PCR Screening     Status: Abnormal   Collection Time: 11/14/16  1:43 PM  Result Value Ref Range Status   MRSA by PCR POSITIVE (A) NEGATIVE Final    Comment:        The GeneXpert MRSA Assay (FDA approved for NASAL specimens only), is one component of a comprehensive MRSA colonization surveillance program. It is not intended to diagnose MRSA infection nor to guide or monitor treatment for MRSA infections. RESULT CALLED TO, READ BACK BY AND VERIFIED WITH: Audree Bane [id number] 09.01.2018 N. MORRIS   Aerobic/Anaerobic Culture  (surgical/deep wound)     Status: None   Collection Time: 11/18/16  4:11 PM  Result Value Ref Range Status   Specimen Description PERITONEAL  Final   Special Requests POF VANC AND ZOSYN  Final   Gram Stain   Final    FEW WBC PRESENT, PREDOMINANTLY MONONUCLEAR ABUNDANT GRAM NEGATIVE RODS RARE GRAM POSITIVE COCCI IN PAIRS RARE GRAM POSITIVE RODS    Culture   Final    ABUNDANT ESCHERICHIA COLI MIXED ANAEROBIC FLORA PRESENT.  CALL LAB IF FURTHER IID REQUIRED.    Report Status 11/22/2016 FINAL  Final   Organism ID, Bacteria ESCHERICHIA COLI  Final      Susceptibility   Escherichia coli - MIC*    AMPICILLIN 4 SENSITIVE Sensitive     CEFAZOLIN <=4 SENSITIVE Sensitive     CEFEPIME <=1 SENSITIVE Sensitive  CEFTAZIDIME <=1 SENSITIVE Sensitive     CEFTRIAXONE <=1 SENSITIVE Sensitive     CIPROFLOXACIN <=0.25 SENSITIVE Sensitive     GENTAMICIN <=1 SENSITIVE Sensitive     IMIPENEM <=0.25 SENSITIVE Sensitive     TRIMETH/SULFA <=20 SENSITIVE Sensitive     AMPICILLIN/SULBACTAM <=2 SENSITIVE Sensitive     PIP/TAZO <=4 SENSITIVE Sensitive     Extended ESBL NEGATIVE Sensitive     * ABUNDANT ESCHERICHIA COLI  Surgical PCR screen     Status: Abnormal   Collection Time: 11/20/16  5:26 AM  Result Value Ref Range Status   MRSA, PCR POSITIVE (A) NEGATIVE Final    Comment: RESULT CALLED TO, READ BACK BY AND VERIFIED WITH: C. Dupont RN 11:35 11/20/16 ([email or web address])    Staphylococcus aureus POSITIVE (A) NEGATIVE Final    Comment: (NOTE) The Xpert SA Assay (FDA approved for NASAL specimens in patients 6 years of age and older), is one component of a comprehensive surveillance program. It is not intended to diagnose infection nor to guide or monitor treatment.   Aerobic Culture (superficial specimen)     Status: None   Collection Time: 11/30/16 10:40 AM  Result Value Ref Range Status   Specimen Description WOUND ABDOMEN  Final   Special Requests Normal  Final   Gram Stain   Final    DEGENERATED  CELLULAR MATERIAL PRESENT FEW GRAM NEGATIVE RODS RARE GRAM VARIABLE ROD    Culture   Final    MODERATE KLEBSIELLA OXYTOCA MODERATE ESCHERICHIA COLI    Report Status 12/02/2016 FINAL  Final   Organism ID, Bacteria KLEBSIELLA OXYTOCA  Final   Organism ID, Bacteria ESCHERICHIA COLI  Final      Susceptibility   Escherichia coli - MIC*    AMPICILLIN >=32 RESISTANT Resistant     CEFAZOLIN >=64 RESISTANT Resistant     CEFEPIME <=1 SENSITIVE Sensitive     CEFTAZIDIME >=64 RESISTANT Resistant     CEFTRIAXONE >=64 RESISTANT Resistant     CIPROFLOXACIN <=0.25 SENSITIVE Sensitive     GENTAMICIN <=1 SENSITIVE Sensitive     IMIPENEM 0.5 SENSITIVE Sensitive     TRIMETH/SULFA <=20 SENSITIVE Sensitive     AMPICILLIN/SULBACTAM >=32 RESISTANT Resistant     PIP/TAZO >=128 RESISTANT Resistant     Extended ESBL NEGATIVE Sensitive     * MODERATE ESCHERICHIA COLI   Klebsiella oxytoca - MIC*    AMPICILLIN >=32 RESISTANT Resistant     CEFAZOLIN 16 SENSITIVE Sensitive     CEFEPIME <=1 SENSITIVE Sensitive     CEFTAZIDIME <=1 SENSITIVE Sensitive     CEFTRIAXONE <=1 SENSITIVE Sensitive     CIPROFLOXACIN <=0.25 SENSITIVE Sensitive     GENTAMICIN <=1 SENSITIVE Sensitive     IMIPENEM <=0.25 SENSITIVE Sensitive     TRIMETH/SULFA <=20 SENSITIVE Sensitive     AMPICILLIN/SULBACTAM 8 SENSITIVE Sensitive     PIP/TAZO <=4 SENSITIVE Sensitive     Extended ESBL NEGATIVE Sensitive     * MODERATE KLEBSIELLA OXYTOCA  Anaerobic culture     Status: None (Preliminary result)   Collection Time: 11/30/16  3:57 PM  Result Value Ref Range Status   Specimen Description WOUND ABDOMEN  Final   Special Requests Normal  Final   Gram Stain   Final    ABUNDANT WBC PRESENT,BOTH PMN AND MONONUCLEAR FEW GRAM VARIABLE ROD FEW GRAM NEGATIVE RODS    Culture   Final    MIXED ANAEROBIC FLORA PRESENT.  CALL LAB IF FURTHER IID REQUIRED.   Report Status PENDING  Incomplete  Aerobic/Anaerobic Culture (surgical/deep wound)     Status:  None (Preliminary result)   Collection Time: 12/02/16  1:56 PM  Result Value Ref Range Status   Specimen Description ABSCESS LOWER LEFT  Final   Special Requests LEFT LOWER QUADRANT ABDOMINAL ABSCESS FLUID  Final   Gram Stain   Final    RARE WBC PRESENT,BOTH PMN AND MONONUCLEAR NO ORGANISMS SEEN    Culture NO GROWTH < 24 HOURS  Final   Report Status PENDING  Incomplete    Anti-infectives:  Anti-infectives    Start     Dose/Rate Route Frequency Ordered Stop   12/02/16 1500  ertapenem (INVANZ) 1 g in sodium chloride 0.9 % 50 mL IVPB     1 g 100 mL/hr over 30 Minutes Intravenous Every 24 hours 12/02/16 1319     11/29/16 1930  piperacillin-tazobactam (ZOSYN) IVPB 3.375 g  Status:  Discontinued     3.375 g 12.5 mL/hr over 240 Minutes Intravenous Every 8 hours 11/29/16 1844 12/02/16 1319   11/17/16 0000  vancomycin (VANCOCIN) 1,500 mg in sodium chloride 0.9 % 500 mL IVPB  Status:  Discontinued     1,500 mg 250 mL/hr over 120 Minutes Intravenous Every 8 hours 11/16/16 1700 11/19/16 0947   11/15/16 1600  vancomycin (VANCOCIN) IVPB 1000 mg/200 mL premix  Status:  Discontinued     1,000 mg 200 mL/hr over 60 Minutes Intravenous Every 8 hours 11/15/16 1332 11/16/16 1700   11/15/16 0130  vancomycin (VANCOCIN) IVPB 1000 mg/200 mL premix  Status:  Discontinued     1,000 mg 200 mL/hr over 60 Minutes Intravenous Every 8 hours 11/14/16 1628 11/15/16 1332   11/14/16 1730  vancomycin (VANCOCIN) 2,000 mg in sodium chloride 0.9 % 500 mL IVPB     2,000 mg 250 mL/hr over 120 Minutes Intravenous  Once 11/14/16 1628 11/14/16 2042   11/13/16 1400  piperacillin-tazobactam (ZOSYN) IVPB 3.375 g  Status:  Discontinued     3.375 g 100 mL/hr over 30 Minutes Intravenous Every 8 hours 11/13/16 1115 11/13/16 1121   11/12/16 1130  piperacillin-tazobactam (ZOSYN) IVPB 3.375 g  Status:  Discontinued     3.375 g 12.5 mL/hr over 240 Minutes Intravenous Every 8 hours 11/12/16 1038 11/24/16 0943   11/05/16 0600   ceFAZolin (ANCEF) IVPB 2g/100 mL premix     2 g 200 mL/hr over 30 Minutes Intravenous  Once 11/05/16 0559 11/05/16 1610      Best Practice/Protocols:  VTE Prophylaxis: Lovenox (prophylaxtic dose) and Mechanical GI Prophylaxis: Proton Pump Inhibitor Continous Sedation  Consults: Treatment Team:  Myrene Galas, MD Serena Colonel, MD    Studies:    Events:  Subjective:    Overnight Issues:  Agitation. Patient maxxed out on versed, fentanyl, and precedex. Patient had smear of BM this AM  Objective:  Vital signs for last 24 hours: Temp:  [98.4 F (36.9 C)-100.4 F (38 C)] 100.1 F (37.8 C) (09/20 0400) Pulse Rate:  [34-111] 87 (09/20 0828) Resp:  [15-39] 39 (09/20 0828) BP: (114-152)/(68-102) 126/70 (09/20 0828) SpO2:  [95 %-100 %] 100 % (09/20 0828) FiO2 (%):  [30 %-35 %] 35 % (09/20 0828) Weight:  [93.2 kg (205 lb 7.5 oz)] 93.2 kg (205 lb 7.5 oz) (09/20 0352)  Hemodynamic parameters for last 24 hours:    Intake/Output from previous day: 09/19 0701 - 09/20 0700 In: 4839.4 [I.V.:4739.4; IV Piggyback:100] Out: 4575 [Urine:3550; Emesis/NG output:1000; Drains:25]  Intake/Output this shift: Total I/O In: -  Out: 705 [Urine:500;  Emesis/NG output:200; Drains:5]  Vent settings for last 24 hours: Vent Mode: PRVC FiO2 (%):  [30 %-35 %] 35 % Set Rate:  [16 bmp] 16 bmp Vt Set:  [560 mL] 560 mL PEEP:  [5 cmH20] 5 cmH20 Plateau Pressure:  [21 cmH20-24 cmH20] 23 cmH20  Physical Exam:  General: no respiratory distress and opening eyes to voice, soft restraints present Neuro: agitated and opens eyes to voice, F/C HEENT/Neck: trach-clean, intact Resp: expiratory wheeze on the right lower lung field CVS: regular rate and rhythm, S1, S2 normal, no murmur, click, rub or gallop GI: tinkling bowel sounds, distention, midline wound with beefy red granulation tissue on the sides and slough present medially   Results for orders placed or performed during the hospital encounter  of 11/05/16 (from the past 24 hour(s))  Glucose, capillary     Status: Abnormal   Collection Time: 12/02/16 11:56 AM  Result Value Ref Range   Glucose-Capillary 103 (H) 65 - 99 mg/dL  Aerobic/Anaerobic Culture (surgical/deep wound)     Status: None (Preliminary result)   Collection Time: 12/02/16  1:56 PM  Result Value Ref Range   Specimen Description ABSCESS LOWER LEFT    Special Requests LEFT LOWER QUADRANT ABDOMINAL ABSCESS FLUID    Gram Stain      RARE WBC PRESENT,BOTH PMN AND MONONUCLEAR NO ORGANISMS SEEN    Culture NO GROWTH < 24 HOURS    Report Status PENDING   Glucose, capillary     Status: Abnormal   Collection Time: 12/02/16  4:34 PM  Result Value Ref Range   Glucose-Capillary 124 (H) 65 - 99 mg/dL  Glucose, capillary     Status: Abnormal   Collection Time: 12/02/16  7:59 PM  Result Value Ref Range   Glucose-Capillary 137 (H) 65 - 99 mg/dL  Glucose, capillary     Status: None   Collection Time: 12/02/16 11:24 PM  Result Value Ref Range   Glucose-Capillary 96 65 - 99 mg/dL  Glucose, capillary     Status: Abnormal   Collection Time: 12/03/16  3:39 AM  Result Value Ref Range   Glucose-Capillary 107 (H) 65 - 99 mg/dL   Comment 1 Procedure Error   Comprehensive metabolic panel     Status: Abnormal   Collection Time: 12/03/16  5:17 AM  Result Value Ref Range   Sodium 133 (L) 135 - 145 mmol/L   Potassium 4.0 3.5 - 5.1 mmol/L   Chloride 100 (L) 101 - 111 mmol/L   CO2 26 22 - 32 mmol/L   Glucose, Bld 132 (H) 65 - 99 mg/dL   BUN 10 6 - 20 mg/dL   Creatinine, Ser 7.82 (L) 0.61 - 1.24 mg/dL   Calcium 7.9 (L) 8.9 - 10.3 mg/dL   Total Protein 6.2 (L) 6.5 - 8.1 g/dL   Albumin 1.6 (L) 3.5 - 5.0 g/dL   AST 30 15 - 41 U/L   ALT 32 17 - 63 U/L   Alkaline Phosphatase 175 (H) 38 - 126 U/L   Total Bilirubin 1.1 0.3 - 1.2 mg/dL   GFR calc non Af Amer >60 >60 mL/min   GFR calc Af Amer >60 >60 mL/min   Anion gap 7 5 - 15  Magnesium     Status: Abnormal   Collection Time:  12/03/16  5:17 AM  Result Value Ref Range   Magnesium 1.4 (L) 1.7 - 2.4 mg/dL  Phosphorus     Status: None   Collection Time: 12/03/16  5:17 AM  Result  Value Ref Range   Phosphorus 4.1 2.5 - 4.6 mg/dL  CBC with Differential/Platelet     Status: Abnormal   Collection Time: 12/03/16  5:17 AM  Result Value Ref Range   WBC 11.0 (H) 4.0 - 10.5 K/uL   RBC 2.69 (L) 4.22 - 5.81 MIL/uL   Hemoglobin 7.9 (L) 13.0 - 17.0 g/dL   HCT 16.1 (L) 09.6 - 04.5 %   MCV 89.6 78.0 - 100.0 fL   MCH 29.4 26.0 - 34.0 pg   MCHC 32.8 30.0 - 36.0 g/dL   RDW 40.9 81.1 - 91.4 %   Platelets 579 (H) 150 - 400 K/uL   Neutrophils Relative % 75 %   Neutro Abs 8.3 (H) 1.7 - 7.7 K/uL   Lymphocytes Relative 14 %   Lymphs Abs 1.5 0.7 - 4.0 K/uL   Monocytes Relative 9 %   Monocytes Absolute 1.0 0.1 - 1.0 K/uL   Eosinophils Relative 2 %   Eosinophils Absolute 0.2 0.0 - 0.7 K/uL   Basophils Relative 0 %   Basophils Absolute 0.0 0.0 - 0.1 K/uL  Glucose, capillary     Status: Abnormal   Collection Time: 12/03/16  8:00 AM  Result Value Ref Range   Glucose-Capillary 129 (H) 65 - 99 mg/dL    Assessment & Plan: Present on Admission: . Open skull fracture (HCC) . Brachial plexus injury, left . Right scapula fracture    LOS: 28 days   Additional comments: Open L skull fx with SDH/SAH- closed head injury; per Dr. Wynetta Emery Right rib fxs 3-7 with hemopneumothorax Comminuted manibular fx; Right orbital fx, nasal bone fx, nasal septal fx- s/p trach, ORIF mandible fx with fixation, ORIF R tripod fx, closed reductions nasal fx Dr Jearld Fenton 8/24 Right scapula fx- sling, non-op, WBAT per Dr. 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 Wynetta Emery. Planreferral to Dr. Nedra Hai at [facility] for plexus reconstruction after discharge, initiate PROM to prevent/limit development of contractures - continue resting hand splint Vent dependent acute hypoxic  resp failure - weaning  Anxiety/agitaiton- impairing weaning. Precedex/ versed  Once we can use his gut will try to decrease drips S/sp ex lap and SBR for abodminal compartment syndrome 9/5 Dr. Lindie Spruce, S/P ex lap SBR 9/7 Dr. Janee Morn, S/P SB anastamosis and closure of abdomen 9/10 Dr. Janee Morn - Ileus - start some clamping trials today Intra-abdominal abscess/Wound infection - s/p IR drainage - continue IV abx - WBC 11.0, Tmax 100.1 - drain with 25 cc output in 24hr  FEN- NGT output 1000 in 24 hr; Clamping trials, continue TPN ABL anemia - up a bit ID- Zosyn (9/17>9/); Cx grew klebsiella and E.coli started on Invanz (9/19>>) RUE edema- duplex negative, resolved VTE prophlayxis - Lovenox  Dispo: ICU. Foot restraints ordered. Clamping trials.   Critical Care Total Time*: 30 Minutes  Wells Guiles , [facility] Surgery 12/03/2016, 12:08 PM Pager: [phone] Mon-Fri 7:00 am-4:30 pm Sat-Sun 7:00 am-11:30 am  12/03/2016  *Care during the described time interval was provided by me. I have reviewed this patient's available data, including medical history, events of note, physical examination and test results as part of my evaluation.

## 2016-12-03 NOTE — Progress Notes (Signed)
Follow up - Trauma and Critical Care  Patient Details:    Chase Ayala is an 26 y.o. male.  Lines/tubes : PICC Triple Lumen 11/19/16 PICC Right Brachial 40 cm 0 cm (Active)  Indication for Insertion or Continuance of Line Administration of hyperosmolar/irritating solutions (i.e. TPN, Vancomycin, etc.);Prolonged intravenous therapies 12/02/2016  8:00 PM  Exposed Catheter (cm) 0 cm 11/24/2016  8:00 AM  Site Assessment Clean;Dry;Intact 12/02/2016  8:00 PM  Lumen #1 Status Infusing 12/02/2016  8:00 PM  Lumen #2 Status Infusing 12/02/2016  8:00 PM  Lumen #3 Status Infusing 12/02/2016  8:00 PM  Dressing Type Transparent 12/02/2016  8:00 PM  Dressing Status Clean;Dry;Intact 12/02/2016  8:00 PM  Line Care Connections checked and tightened 12/02/2016  8:00 PM  Dressing Intervention Other (Comment) 12/02/2016  8:00 PM  Dressing Change Due 12/03/16 12/02/2016  8:00 PM     Closed System Drain 1 Left Abdomen Bulb (JP) 10 Fr. (Active)  Site Description Unable to view 12/02/2016  8:00 PM  Dressing Status Clean;Dry;Intact 12/02/2016  8:00 PM  Drainage Appearance Serosanguineous;Milky 12/02/2016  8:00 PM  Status To suction (Charged) 12/02/2016  8:00 PM  Output (mL) 5 mL 12/03/2016 10:00 AM     NG/OG Tube Nasogastric Left nare Xray (Active)  Site Assessment Clean;Dry;Intact 12/02/2016  8:00 PM  Ongoing Placement Verification No change in cm markings or external length of tube from initial placement;No change in respiratory status;No acute changes, not attributed to clinical condition;Xray 12/02/2016  8:00 PM  Status Suction-low intermittent 12/02/2016  8:00 PM  Drainage Appearance Green;Brown 12/02/2016  8:00 PM  Output (mL) 200 mL 12/03/2016 10:00 AM     Urethral Catheter Carilyn Goodpasture, RN Double-lumen 16 Fr. (Active)  Indication for Insertion or Continuance of Catheter Acute urinary retention 12/02/2016  8:00 PM  Site Assessment Clean;Intact 12/02/2016  8:00 PM  Catheter Maintenance Bag below level of  bladder;Catheter secured;Drainage bag/tubing not touching floor;No dependent loops;Seal intact;Insertion date on drainage bag 12/02/2016  8:00 PM  Collection Container Standard drainage bag 12/02/2016  8:00 PM  Securement Method Securing device (Describe) 12/02/2016  8:00 PM  Urinary Catheter Interventions Unclamped 12/02/2016  8:00 PM  Input (mL) 80 mL 11/18/2016  2:00 PM  Output (mL) 500 mL 12/03/2016 10:00 AM    Microbiology/Sepsis markers: Results for orders placed or performed during the hospital encounter of 11/05/16  Culture, Urine     Status: None   Collection Time: 11/12/16 10:34 AM  Result Value Ref Range Status   Specimen Description URINE, CATHETERIZED  Final   Special Requests NONE  Final   Culture NO GROWTH  Final   Report Status 11/13/2016 FINAL  Final  Culture, respiratory (NON-Expectorated)     Status: Abnormal   Collection Time: 11/12/16 11:11 AM  Result Value Ref Range Status   Specimen Description TRACHEAL ASPIRATE  Final   Special Requests NONE  Final   Gram Stain   Final    RARE SQUAMOUS EPITHELIAL CELLS PRESENT FEW WBC PRESENT,BOTH PMN AND MONONUCLEAR RARE GRAM NEGATIVE RODS RARE GRAM POSITIVE COCCI RARE GRAM NEGATIVE COCCI    Culture MULTIPLE ORGANISMS PRESENT, NONE PREDOMINANT (A)  Final   Report Status 11/14/2016 FINAL  Final  Culture, blood (routine x 2)     Status: None   Collection Time: 11/12/16 11:18 AM  Result Value Ref Range Status   Specimen Description BLOOD LEFT HAND  Final   Special Requests IN PEDIATRIC BOTTLE Blood Culture adequate volume  Final   Culture NO GROWTH  5 DAYS  Final   Report Status 11/17/2016 FINAL  Final  Culture, blood (routine x 2)     Status: None   Collection Time: 11/12/16 11:22 AM  Result Value Ref Range Status   Specimen Description BLOOD LEFT ANTECUBITAL  Final   Special Requests IN PEDIATRIC BOTTLE Blood Culture adequate volume  Final   Culture NO GROWTH 5 DAYS  Final   Report Status 11/17/2016 FINAL  Final  Culture,  respiratory (NON-Expectorated)     Status: Abnormal   Collection Time: 11/13/16 12:10 PM  Result Value Ref Range Status   Specimen Description TRACHEAL ASPIRATE  Final   Special Requests Normal  Final   Gram Stain   Final    RARE WBC PRESENT,BOTH PMN AND MONONUCLEAR FEW GRAM VARIABLE ROD RARE GRAM POSITIVE COCCI IN PAIRS    Culture MULTIPLE ORGANISMS PRESENT, NONE PREDOMINANT (A)  Final   Report Status 11/15/2016 FINAL  Final  Culture, blood (Routine X 2) w Reflex to ID Panel     Status: None   Collection Time: 11/13/16 12:40 PM  Result Value Ref Range Status   Specimen Description BLOOD RIGHT ANTECUBITAL  Final   Special Requests IN PEDIATRIC BOTTLE Blood Culture adequate volume  Final   Culture NO GROWTH 5 DAYS  Final   Report Status 11/18/2016 FINAL  Final  Culture, blood (Routine X 2) w Reflex to ID Panel     Status: None   Collection Time: 11/13/16 12:40 PM  Result Value Ref Range Status   Specimen Description BLOOD LEFT ANTECUBITAL  Final   Special Requests IN PEDIATRIC BOTTLE Blood Culture adequate volume  Final   Culture NO GROWTH 5 DAYS  Final   Report Status 11/18/2016 FINAL  Final  Culture, Urine     Status: None   Collection Time: 11/13/16  1:56 PM  Result Value Ref Range Status   Specimen Description URINE, CATHETERIZED  Final   Special Requests Normal  Final   Culture NO GROWTH  Final   Report Status 11/14/2016 FINAL  Final  MRSA PCR Screening     Status: Abnormal   Collection Time: 11/14/16  1:43 PM  Result Value Ref Range Status   MRSA by PCR POSITIVE (A) NEGATIVE Final    Comment:        The GeneXpert MRSA Assay (FDA approved for NASAL specimens only), is one component of a comprehensive MRSA colonization surveillance program. It is not intended to diagnose MRSA infection nor to guide or monitor treatment for MRSA infections. RESULT CALLED TO, READ BACK BY AND VERIFIED WITH: Audree Bane [id number] 09.01.2018 N. MORRIS   Aerobic/Anaerobic Culture  (surgical/deep wound)     Status: None   Collection Time: 11/18/16  4:11 PM  Result Value Ref Range Status   Specimen Description PERITONEAL  Final   Special Requests POF VANC AND ZOSYN  Final   Gram Stain   Final    FEW WBC PRESENT, PREDOMINANTLY MONONUCLEAR ABUNDANT GRAM NEGATIVE RODS RARE GRAM POSITIVE COCCI IN PAIRS RARE GRAM POSITIVE RODS    Culture   Final    ABUNDANT ESCHERICHIA COLI MIXED ANAEROBIC FLORA PRESENT.  CALL LAB IF FURTHER IID REQUIRED.    Report Status 11/22/2016 FINAL  Final   Organism ID, Bacteria ESCHERICHIA COLI  Final      Susceptibility   Escherichia coli - MIC*    AMPICILLIN 4 SENSITIVE Sensitive     CEFAZOLIN <=4 SENSITIVE Sensitive     CEFEPIME <=1 SENSITIVE Sensitive  CEFTAZIDIME <=1 SENSITIVE Sensitive     CEFTRIAXONE <=1 SENSITIVE Sensitive     CIPROFLOXACIN <=0.25 SENSITIVE Sensitive     GENTAMICIN <=1 SENSITIVE Sensitive     IMIPENEM <=0.25 SENSITIVE Sensitive     TRIMETH/SULFA <=20 SENSITIVE Sensitive     AMPICILLIN/SULBACTAM <=2 SENSITIVE Sensitive     PIP/TAZO <=4 SENSITIVE Sensitive     Extended ESBL NEGATIVE Sensitive     * ABUNDANT ESCHERICHIA COLI  Surgical PCR screen     Status: Abnormal   Collection Time: 11/20/16  5:26 AM  Result Value Ref Range Status   MRSA, PCR POSITIVE (A) NEGATIVE Final    Comment: RESULT CALLED TO, READ BACK BY AND VERIFIED WITH: C. Dupont RN 11:35 11/20/16 ([email or web address])    Staphylococcus aureus POSITIVE (A) NEGATIVE Final    Comment: (NOTE) The Xpert SA Assay (FDA approved for NASAL specimens in patients 6 years of age and older), is one component of a comprehensive surveillance program. It is not intended to diagnose infection nor to guide or monitor treatment.   Aerobic Culture (superficial specimen)     Status: None   Collection Time: 11/30/16 10:40 AM  Result Value Ref Range Status   Specimen Description WOUND ABDOMEN  Final   Special Requests Normal  Final   Gram Stain   Final    DEGENERATED  CELLULAR MATERIAL PRESENT FEW GRAM NEGATIVE RODS RARE GRAM VARIABLE ROD    Culture   Final    MODERATE KLEBSIELLA OXYTOCA MODERATE ESCHERICHIA COLI    Report Status 12/02/2016 FINAL  Final   Organism ID, Bacteria KLEBSIELLA OXYTOCA  Final   Organism ID, Bacteria ESCHERICHIA COLI  Final      Susceptibility   Escherichia coli - MIC*    AMPICILLIN >=32 RESISTANT Resistant     CEFAZOLIN >=64 RESISTANT Resistant     CEFEPIME <=1 SENSITIVE Sensitive     CEFTAZIDIME >=64 RESISTANT Resistant     CEFTRIAXONE >=64 RESISTANT Resistant     CIPROFLOXACIN <=0.25 SENSITIVE Sensitive     GENTAMICIN <=1 SENSITIVE Sensitive     IMIPENEM 0.5 SENSITIVE Sensitive     TRIMETH/SULFA <=20 SENSITIVE Sensitive     AMPICILLIN/SULBACTAM >=32 RESISTANT Resistant     PIP/TAZO >=128 RESISTANT Resistant     Extended ESBL NEGATIVE Sensitive     * MODERATE ESCHERICHIA COLI   Klebsiella oxytoca - MIC*    AMPICILLIN >=32 RESISTANT Resistant     CEFAZOLIN 16 SENSITIVE Sensitive     CEFEPIME <=1 SENSITIVE Sensitive     CEFTAZIDIME <=1 SENSITIVE Sensitive     CEFTRIAXONE <=1 SENSITIVE Sensitive     CIPROFLOXACIN <=0.25 SENSITIVE Sensitive     GENTAMICIN <=1 SENSITIVE Sensitive     IMIPENEM <=0.25 SENSITIVE Sensitive     TRIMETH/SULFA <=20 SENSITIVE Sensitive     AMPICILLIN/SULBACTAM 8 SENSITIVE Sensitive     PIP/TAZO <=4 SENSITIVE Sensitive     Extended ESBL NEGATIVE Sensitive     * MODERATE KLEBSIELLA OXYTOCA  Anaerobic culture     Status: None (Preliminary result)   Collection Time: 11/30/16  3:57 PM  Result Value Ref Range Status   Specimen Description WOUND ABDOMEN  Final   Special Requests Normal  Final   Gram Stain   Final    ABUNDANT WBC PRESENT,BOTH PMN AND MONONUCLEAR FEW GRAM VARIABLE ROD FEW GRAM NEGATIVE RODS    Culture   Final    MIXED ANAEROBIC FLORA PRESENT.  CALL LAB IF FURTHER IID REQUIRED.   Report Status PENDING  Incomplete  Aerobic/Anaerobic Culture (surgical/deep wound)     Status:  None (Preliminary result)   Collection Time: 12/02/16  1:56 PM  Result Value Ref Range Status   Specimen Description ABSCESS LOWER LEFT  Final   Special Requests LEFT LOWER QUADRANT ABDOMINAL ABSCESS FLUID  Final   Gram Stain   Final    RARE WBC PRESENT,BOTH PMN AND MONONUCLEAR NO ORGANISMS SEEN    Culture NO GROWTH < 24 HOURS  Final   Report Status PENDING  Incomplete    Anti-infectives:  Anti-infectives    Start     Dose/Rate Route Frequency Ordered Stop   12/02/16 1500  ertapenem (INVANZ) 1 g in sodium chloride 0.9 % 50 mL IVPB     1 g 100 mL/hr over 30 Minutes Intravenous Every 24 hours 12/02/16 1319     11/29/16 1930  piperacillin-tazobactam (ZOSYN) IVPB 3.375 g  Status:  Discontinued     3.375 g 12.5 mL/hr over 240 Minutes Intravenous Every 8 hours 11/29/16 1844 12/02/16 1319   11/17/16 0000  vancomycin (VANCOCIN) 1,500 mg in sodium chloride 0.9 % 500 mL IVPB  Status:  Discontinued     1,500 mg 250 mL/hr over 120 Minutes Intravenous Every 8 hours 11/16/16 1700 11/19/16 0947   11/15/16 1600  vancomycin (VANCOCIN) IVPB 1000 mg/200 mL premix  Status:  Discontinued     1,000 mg 200 mL/hr over 60 Minutes Intravenous Every 8 hours 11/15/16 1332 11/16/16 1700   11/15/16 0130  vancomycin (VANCOCIN) IVPB 1000 mg/200 mL premix  Status:  Discontinued     1,000 mg 200 mL/hr over 60 Minutes Intravenous Every 8 hours 11/14/16 1628 11/15/16 1332   11/14/16 1730  vancomycin (VANCOCIN) 2,000 mg in sodium chloride 0.9 % 500 mL IVPB     2,000 mg 250 mL/hr over 120 Minutes Intravenous  Once 11/14/16 1628 11/14/16 2042   11/13/16 1400  piperacillin-tazobactam (ZOSYN) IVPB 3.375 g  Status:  Discontinued     3.375 g 100 mL/hr over 30 Minutes Intravenous Every 8 hours 11/13/16 1115 11/13/16 1121   11/12/16 1130  piperacillin-tazobactam (ZOSYN) IVPB 3.375 g  Status:  Discontinued     3.375 g 12.5 mL/hr over 240 Minutes Intravenous Every 8 hours 11/12/16 1038 11/24/16 0943   11/05/16 0600   ceFAZolin (ANCEF) IVPB 2g/100 mL premix     2 g 200 mL/hr over 30 Minutes Intravenous  Once 11/05/16 0559 11/05/16 0729      Best Practice/Protocols:  VTE Prophylaxis: Lovenox (prophylaxtic dose) and Mechanical GI Prophylaxis: Proton Pump Inhibitor Only getting enteral sedation.  Consults: Treatment Team:  Myrene Galas, MD Serena Colonel, MD    Events:  Subjective:    Overnight Issues: No new issues overnight.  Not able to last on trach collar much  Objective:  Vital signs for last 24 hours: Temp:  [98.4 F (36.9 C)-100.4 F (38 C)] 100.1 F (37.8 C) (09/20 0400) Pulse Rate:  [34-111] 87 (09/20 0828) Resp:  [15-39] 39 (09/20 0828) BP: (114-152)/(68-102) 126/70 (09/20 0828) SpO2:  [95 %-100 %] 100 % (09/20 0828) FiO2 (%):  [30 %-35 %] 35 % (09/20 0828) Weight:  [93.2 kg (205 lb 7.5 oz)] 93.2 kg (205 lb 7.5 oz) (09/20 0352)  Hemodynamic parameters for last 24 hours:    Intake/Output from previous day: 09/19 0701 - 09/20 0700 In: 4839.4 [I.V.:4739.4; IV Piggyback:100] Out: 4575 [Urine:3550; Emesis/NG output:1000; Drains:25]  Intake/Output this shift: Total I/O In: -  Out: 705 [Urine:500; Emesis/NG output:200; Drains:5]  Vent settings  for last 24 hours: Vent Mode: PRVC FiO2 (%):  [30 %-35 %] 35 % Set Rate:  [16 bmp] 16 bmp Vt Set:  [560 mL] 560 mL PEEP:  [5 cmH20] 5 cmH20 Plateau Pressure:  [21 cmH20-24 cmH20] 23 cmH20  Physical Exam:  General: no respiratory distress and sleepy now Neuro: nonfocal exam and RASS -1 HEENT/Neck: trach-clean, intact Resp: clear to auscultation bilaterally CVS: regular rate and rhythm, S1, S2 normal, no murmur, click, rub or gallop GI: G-tube sight okay with improved erythema and induration. Extremities: edema 2+ and still has some significant lower extremty edema  Results for orders placed or performed during the hospital encounter of 11/05/16 (from the past 24 hour(s))  Glucose, capillary     Status: Abnormal   Collection  Time: 12/02/16 11:56 AM  Result Value Ref Range   Glucose-Capillary 103 (H) 65 - 99 mg/dL  Aerobic/Anaerobic Culture (surgical/deep wound)     Status: None (Preliminary result)   Collection Time: 12/02/16  1:56 PM  Result Value Ref Range   Specimen Description ABSCESS LOWER LEFT    Special Requests LEFT LOWER QUADRANT ABDOMINAL ABSCESS FLUID    Gram Stain      RARE WBC PRESENT,BOTH PMN AND MONONUCLEAR NO ORGANISMS SEEN    Culture NO GROWTH < 24 HOURS    Report Status PENDING   Glucose, capillary     Status: Abnormal   Collection Time: 12/02/16  4:34 PM  Result Value Ref Range   Glucose-Capillary 124 (H) 65 - 99 mg/dL  Glucose, capillary     Status: Abnormal   Collection Time: 12/02/16  7:59 PM  Result Value Ref Range   Glucose-Capillary 137 (H) 65 - 99 mg/dL  Glucose, capillary     Status: None   Collection Time: 12/02/16 11:24 PM  Result Value Ref Range   Glucose-Capillary 96 65 - 99 mg/dL  Glucose, capillary     Status: Abnormal   Collection Time: 12/03/16  3:39 AM  Result Value Ref Range   Glucose-Capillary 107 (H) 65 - 99 mg/dL   Comment 1 Procedure Error   Comprehensive metabolic panel     Status: Abnormal   Collection Time: 12/03/16  5:17 AM  Result Value Ref Range   Sodium 133 (L) 135 - 145 mmol/L   Potassium 4.0 3.5 - 5.1 mmol/L   Chloride 100 (L) 101 - 111 mmol/L   CO2 26 22 - 32 mmol/L   Glucose, Bld 132 (H) 65 - 99 mg/dL   BUN 10 6 - 20 mg/dL   Creatinine, Ser 1.61 (L) 0.61 - 1.24 mg/dL   Calcium 7.9 (L) 8.9 - 10.3 mg/dL   Total Protein 6.2 (L) 6.5 - 8.1 g/dL   Albumin 1.6 (L) 3.5 - 5.0 g/dL   AST 30 15 - 41 U/L   ALT 32 17 - 63 U/L   Alkaline Phosphatase 175 (H) 38 - 126 U/L   Total Bilirubin 1.1 0.3 - 1.2 mg/dL   GFR calc non Af Amer >60 >60 mL/min   GFR calc Af Amer >60 >60 mL/min   Anion gap 7 5 - 15  Magnesium     Status: Abnormal   Collection Time: 12/03/16  5:17 AM  Result Value Ref Range   Magnesium 1.4 (L) 1.7 - 2.4 mg/dL  Phosphorus      Status: None   Collection Time: 12/03/16  5:17 AM  Result Value Ref Range   Phosphorus 4.1 2.5 - 4.6 mg/dL  CBC with Differential/Platelet  Status: Abnormal   Collection Time: 12/03/16  5:17 AM  Result Value Ref Range   WBC 11.0 (H) 4.0 - 10.5 K/uL   RBC 2.69 (L) 4.22 - 5.81 MIL/uL   Hemoglobin 7.9 (L) 13.0 - 17.0 g/dL   HCT 40.9 (L) 81.1 - 91.4 %   MCV 89.6 78.0 - 100.0 fL   MCH 29.4 26.0 - 34.0 pg   MCHC 32.8 30.0 - 36.0 g/dL   RDW 78.2 95.6 - 21.3 %   Platelets 579 (H) 150 - 400 K/uL   Neutrophils Relative % 75 %   Neutro Abs 8.3 (H) 1.7 - 7.7 K/uL   Lymphocytes Relative 14 %   Lymphs Abs 1.5 0.7 - 4.0 K/uL   Monocytes Relative 9 %   Monocytes Absolute 1.0 0.1 - 1.0 K/uL   Eosinophils Relative 2 %   Eosinophils Absolute 0.2 0.0 - 0.7 K/uL   Basophils Relative 0 %   Basophils Absolute 0.0 0.0 - 0.1 K/uL  Glucose, capillary     Status: Abnormal   Collection Time: 12/03/16  8:00 AM  Result Value Ref Range   Glucose-Capillary 129 (H) 65 - 99 mg/dL     Assessment/Plan:   NEURO  Altered Mental Status:  sedation   Plan: Getting sedation through G-tube  PULM  No significant issues except he could not stay on trach collar   Plan: CPM.  Will continue to try weaning to trach collar  CARDIO  Sinus Tachycardia   Plan: No changes  RENAL  Urine output and renal function are good   Plan: CPM  GI  Erythema around G-tube site.   Plan: CPM.  Looks better  ID  No new infectious sorces   Plan: CPM  HEME  Anemia acute blood loss anemia)   Plan: CPM  ENDO No significant issues   Plan: CPM  Global Issues  Not able to wean to trach collar very easily.  May be a matter of altering sedation.  Will try to get up in chair while on the ventilator.    LOS: 28 days   Additional comments:I reviewed the patient's new clinical lab test results. cbc/bmet  Critical Care Total Time*: 30 Minutes  Aanvi Voyles 12/03/2016  *Care during the described time interval was provided by me  and/or other providers on the critical care team.  I have reviewed this patient's available data, including medical history, events of note, physical examination and test results as part of my evaluation.
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
## 2016-12-04 LAB — MAGNESIUM: Magnesium: 1.5 mg/dL — ABNORMAL LOW (ref 1.7–2.4)

## 2016-12-04 LAB — GLUCOSE, CAPILLARY
GLUCOSE-CAPILLARY: 130 mg/dL — AB (ref 65–99)
GLUCOSE-CAPILLARY: 102 mg/dL — AB (ref 65–99)
GLUCOSE-CAPILLARY: 117 mg/dL — AB (ref 65–99)
GLUCOSE-CAPILLARY: 142 mg/dL — AB (ref 65–99)
Glucose-Capillary: 130 mg/dL — ABNORMAL HIGH (ref 65–99)

## 2016-12-04 LAB — TRIGLYCERIDES: Triglycerides: 197 mg/dL — ABNORMAL HIGH (ref <150)

## 2016-12-04 LAB — PHOSPHORUS: PHOSPHORUS: 5.1 mg/dL — AB (ref 2.5–4.6)

## 2016-12-04 MED ORDER — TRACE MINERALS CR-CU-MN-SE-ZN 10-1000-500-60 MCG/ML IV SOLN
INTRAVENOUS | Status: AC
  Administered 2016-12-04: 18:00:00 via INTRAVENOUS
  Filled 2016-12-04: qty 2640

## 2016-12-04 MED ORDER — CLONAZEPAM 1 MG PO TABS
1.0000 mg | ORAL_TABLET | Freq: Two times a day (BID) | ORAL | Status: DC
  Administered 2016-12-04 – 2016-12-07 (×8): 1 mg via ORAL
  Filled 2016-12-04 (×9): qty 1

## 2016-12-04 MED ORDER — FAT EMULSION 20 % IV EMUL
240.0000 mL | INTRAVENOUS | Status: AC
  Administered 2016-12-04: 240 mL via INTRAVENOUS
  Filled 2016-12-04: qty 250

## 2016-12-04 MED ORDER — FENTANYL 25 MCG/HR TD PT72
100.0000 ug | MEDICATED_PATCH | TRANSDERMAL | Status: DC
  Administered 2016-12-04 – 2016-12-13 (×3): 100 ug via TRANSDERMAL
  Filled 2016-12-04 (×2): qty 2
  Filled 2016-12-04: qty 4
  Filled 2016-12-04: qty 2

## 2016-12-04 MED ORDER — MAGNESIUM SULFATE 4 GM/100ML IV SOLN
4.0000 g | Freq: Once | INTRAVENOUS | Status: AC
  Administered 2016-12-04: 4 g via INTRAVENOUS
  Filled 2016-12-04: qty 100

## 2016-12-04 MED ORDER — QUETIAPINE FUMARATE 25 MG PO TABS
50.0000 mg | ORAL_TABLET | Freq: Three times a day (TID) | ORAL | Status: DC
  Administered 2016-12-04 – 2016-12-07 (×12): 50 mg
  Filled 2016-12-04 (×14): qty 2

## 2016-12-04 MED ORDER — CLONAZEPAM 0.1 MG/ML ORAL SUSPENSION: 1.0000 mg | Freq: Two times a day (BID) | ORAL | Status: DC

## 2016-12-04 MED ORDER — PIVOT 1.5 CAL PO LIQD
1000.0000 mL | ORAL | Status: DC
  Administered 2016-12-04: 1000 mL
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
## 2016-12-04 NOTE — Progress Notes (Signed)
RT called to room for patient agitation and desaturation. Patient O2 sats in the 70s when RT arrived and RN was bagging. Patient put back on ventilator full support to rest for the night. RT will continue to monitor.
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
## 2016-12-05 ENCOUNTER — Encounter (HOSPITAL_COMMUNITY): Payer: Self-pay | Admitting: Neurology

## 2016-12-05 LAB — CBC WITH DIFFERENTIAL/PLATELET
BASOS ABS: 0 10*3/uL (ref 0.0–0.1)
BASOS PCT: 0 %
EOS ABS: 0.1 10*3/uL (ref 0.0–0.7)
Eosinophils Relative: 1 %
HCT: 26 % — ABNORMAL LOW (ref 39.0–52.0)
HEMOGLOBIN: 8.4 g/dL — AB (ref 13.0–17.0)
Lymphocytes Relative: 14 %
Lymphs Abs: 2 10*3/uL (ref 0.7–4.0)
MCH: 29.1 pg (ref 26.0–34.0)
MCHC: 32.3 g/dL (ref 30.0–36.0)
MCV: 90 fL (ref 78.0–100.0)
MONOS PCT: 6 %
Monocytes Absolute: 0.9 10*3/uL (ref 0.1–1.0)
NEUTROS PCT: 79 %
Neutro Abs: 11.4 10*3/uL — ABNORMAL HIGH (ref 1.7–7.7)
Platelets: 613 10*3/uL — ABNORMAL HIGH (ref 150–400)
RBC: 2.89 MIL/uL — ABNORMAL LOW (ref 4.22–5.81)
RDW: 15.8 % — ABNORMAL HIGH (ref 11.5–15.5)
WBC: 14.4 10*3/uL — AB (ref 4.0–10.5)

## 2016-12-05 LAB — BASIC METABOLIC PANEL
ANION GAP: 4 — AB (ref 5–15)
BUN: 10 mg/dL (ref 6–20)
CALCIUM: 7 mg/dL — AB (ref 8.9–10.3)
CHLORIDE: 101 mmol/L (ref 101–111)
CO2: 29 mmol/L (ref 22–32)
Creatinine, Ser: 0.55 mg/dL — ABNORMAL LOW (ref 0.61–1.24)
GFR calc non Af Amer: >60 mL/min (ref >60)
GLUCOSE: 115 mg/dL — AB (ref 65–99)
Potassium: 4.6 mmol/L (ref 3.5–5.1)
SODIUM: 134 mmol/L — AB (ref 135–145)

## 2016-12-05 LAB — GLUCOSE, CAPILLARY
GLUCOSE-CAPILLARY: 110 mg/dL — AB (ref 65–99)
GLUCOSE-CAPILLARY: 113 mg/dL — AB (ref 65–99)
GLUCOSE-CAPILLARY: 116 mg/dL — AB (ref 65–99)
GLUCOSE-CAPILLARY: 119 mg/dL — AB (ref 65–99)
Glucose-Capillary: 135 mg/dL — ABNORMAL HIGH (ref 65–99)
Glucose-Capillary: 118 mg/dL — ABNORMAL HIGH (ref 65–99)
Glucose-Capillary: 125 mg/dL — ABNORMAL HIGH (ref 65–99)

## 2016-12-05 LAB — ANAEROBIC CULTURE: SPECIAL REQUESTS: NORMAL

## 2016-12-05 LAB — PHOSPHORUS: PHOSPHORUS: 3.9 mg/dL (ref 2.5–4.6)

## 2016-12-05 LAB — MAGNESIUM: Magnesium: 1.2 mg/dL — ABNORMAL LOW (ref 1.7–2.4)

## 2016-12-05 MED ORDER — TRACE MINERALS CR-CU-MN-SE-ZN 10-1000-500-60 MCG/ML IV SOLN: INTRAVENOUS | Status: DC

## 2016-12-05 MED ORDER — TRACE MINERALS CR-CU-MN-SE-ZN 10-1000-500-60 MCG/ML IV SOLN
INTRAVENOUS | Status: AC
  Administered 2016-12-05: 18:00:00 via INTRAVENOUS
  Filled 2016-12-05: qty 1992

## 2016-12-05 MED ORDER — FAT EMULSION 20 % IV EMUL
240.0000 mL | INTRAVENOUS | Status: AC
  Administered 2016-12-05: 240 mL via INTRAVENOUS
  Filled 2016-12-05: qty 250

## 2016-12-05 MED ORDER — FAT EMULSION 20 % IV EMUL: 240.0000 mL | INTRAVENOUS | Status: DC

## 2016-12-05 MED ORDER — MAGNESIUM SULFATE 4 GM/100ML IV SOLN
4.0000 g | Freq: Two times a day (BID) | INTRAVENOUS | Status: AC
  Administered 2016-12-05 (×2): 4 g via INTRAVENOUS
  Filled 2016-12-05 (×2): qty 100
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
## 2016-12-06 LAB — GLUCOSE, CAPILLARY
GLUCOSE-CAPILLARY: 114 mg/dL — AB (ref 65–99)
Glucose-Capillary: 125 mg/dL — ABNORMAL HIGH (ref 65–99)
Glucose-Capillary: 123 mg/dL — ABNORMAL HIGH (ref 65–99)
Glucose-Capillary: 115 mg/dL — ABNORMAL HIGH (ref 65–99)
Glucose-Capillary: 123 mg/dL — ABNORMAL HIGH (ref 65–99)

## 2016-12-06 MED ORDER — PIVOT 1.5 CAL PO LIQD
1000.0000 mL | ORAL | Status: DC
  Administered 2016-12-06 (×2): 1000 mL

## 2016-12-06 MED ORDER — ACETAMINOPHEN 160 MG/5ML PO SOLN
650.0000 mg | Freq: Four times a day (QID) | ORAL | Status: DC | PRN
  Administered 2016-12-06 – 2016-12-12 (×4): 650 mg
  Filled 2016-12-06 (×4): qty 20.3

## 2016-12-06 MED ORDER — TRACE MINERALS CR-CU-MN-SE-ZN 10-1000-500-60 MCG/ML IV SOLN
INTRAVENOUS | Status: AC
  Administered 2016-12-06: 18:00:00 via INTRAVENOUS
  Filled 2016-12-06: qty 960

## 2016-12-06 NOTE — Progress Notes (Signed)
Increased PS to 15 due to patient WOB and RR

## 2016-12-06 NOTE — Progress Notes (Signed)
Patient placed on ATC at 35% and 8L flow. Sats are 98% and vitals are stable. RT will continue to monitor.
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
## 2016-12-06 NOTE — Progress Notes (Signed)
RT had a trach change assignment show up on worklist. Patient's trach was changed on 11/27/2016 and per protocol is not due to be changed again until 12/11/2016. Patient's trach will need to be changed at two weeks on 12/11/2016.
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
## 2016-12-07 LAB — DIFFERENTIAL
BASOS PCT: 1 %
Basophils Absolute: 0 10*3/uL (ref 0.0–0.1)
EOS ABS: 0.2 10*3/uL (ref 0.0–0.7)
EOS PCT: 3 %
Lymphocytes Relative: 26 %
Lymphs Abs: 1.8 10*3/uL (ref 0.7–4.0)
Monocytes Absolute: 0.6 10*3/uL (ref 0.1–1.0)
Monocytes Relative: 9 %
NEUTROS PCT: 61 %
Neutro Abs: 4.1 10*3/uL (ref 1.7–7.7)

## 2016-12-07 LAB — GLUCOSE, CAPILLARY
GLUCOSE-CAPILLARY: 123 mg/dL — AB (ref 65–99)
GLUCOSE-CAPILLARY: 111 mg/dL — AB (ref 65–99)
Glucose-Capillary: 100 mg/dL — ABNORMAL HIGH (ref 65–99)
Glucose-Capillary: 103 mg/dL — ABNORMAL HIGH (ref 65–99)
Glucose-Capillary: 108 mg/dL — ABNORMAL HIGH (ref 65–99)
Glucose-Capillary: 112 mg/dL — ABNORMAL HIGH (ref 65–99)
Glucose-Capillary: 125 mg/dL — ABNORMAL HIGH (ref 65–99)

## 2016-12-07 LAB — CBC
HCT: 27.7 % — ABNORMAL LOW (ref 39.0–52.0)
Hemoglobin: 8.7 g/dL — ABNORMAL LOW (ref 13.0–17.0)
MCH: 28.8 pg (ref 26.0–34.0)
MCHC: 31.4 g/dL (ref 30.0–36.0)
MCV: 91.7 fL (ref 78.0–100.0)
PLATELETS: 593 10*3/uL — AB (ref 150–400)
RBC: 3.02 MIL/uL — AB (ref 4.22–5.81)
RDW: 15.9 % — AB (ref 11.5–15.5)
WBC: 6.6 10*3/uL (ref 4.0–10.5)

## 2016-12-07 LAB — COMPREHENSIVE METABOLIC PANEL
ALK PHOS: 196 U/L — AB (ref 38–126)
ALT: 56 U/L (ref 17–63)
ANION GAP: 8 (ref 5–15)
AST: 40 U/L (ref 15–41)
Albumin: 1.7 g/dL — ABNORMAL LOW (ref 3.5–5.0)
BILIRUBIN TOTAL: 0.8 mg/dL (ref 0.3–1.2)
BUN: 10 mg/dL (ref 6–20)
CALCIUM: 8.6 mg/dL — AB (ref 8.9–10.3)
CO2: 29 mmol/L (ref 22–32)
CREATININE: 0.5 mg/dL — AB (ref 0.61–1.24)
Chloride: 100 mmol/L — ABNORMAL LOW (ref 101–111)
GFR calc Af Amer: >60 mL/min (ref >60)
GLUCOSE: 115 mg/dL — AB (ref 65–99)
Potassium: 3.9 mmol/L (ref 3.5–5.1)
Sodium: 137 mmol/L (ref 135–145)
TOTAL PROTEIN: 7.3 g/dL (ref 6.5–8.1)

## 2016-12-07 LAB — AEROBIC/ANAEROBIC CULTURE W GRAM STAIN (SURGICAL/DEEP WOUND)

## 2016-12-07 LAB — TRIGLYCERIDES: TRIGLYCERIDES: 163 mg/dL — AB (ref <150)

## 2016-12-07 LAB — PHOSPHORUS: Phosphorus: 6 mg/dL — ABNORMAL HIGH (ref 2.5–4.6)

## 2016-12-07 LAB — AEROBIC/ANAEROBIC CULTURE (SURGICAL/DEEP WOUND): CULTURE: NO GROWTH

## 2016-12-07 LAB — MAGNESIUM: MAGNESIUM: 1.3 mg/dL — AB (ref 1.7–2.4)

## 2016-12-07 LAB — PREALBUMIN: PREALBUMIN: 17.8 mg/dL — AB (ref 18–38)

## 2016-12-07 MED ORDER — INSULIN ASPART 100 UNIT/ML ~~LOC~~ SOLN
0.0000 [IU] | SUBCUTANEOUS | Status: DC
  Administered 2016-12-09 – 2016-12-13 (×6): 2 [IU] via SUBCUTANEOUS
  Administered 2016-12-14: 1 [IU] via SUBCUTANEOUS

## 2016-12-07 MED ORDER — PIVOT 1.5 CAL PO LIQD
1000.0000 mL | ORAL | Status: DC
  Administered 2016-12-07 – 2016-12-09 (×2): 1000 mL
  Filled 2016-12-07 (×2): qty 1000

## 2016-12-07 MED ORDER — INSULIN ASPART 100 UNIT/ML ~~LOC~~ SOLN: 0.0000 [IU] | Freq: Three times a day (TID) | SUBCUTANEOUS | Status: DC

## 2016-12-07 NOTE — Progress Notes (Signed)
Patient ID: Chase Ayala, male   DOB: 21-Jun-1990, 26 y.o.   MRN: [id number]    Referring Physician(s): Dr. Jimmye Norman  Supervising Physician: Gilmer Mor  Patient Status: [facility] - In-pt  Chief Complaint: Intra-abdominal abscess  Subjective: Patient in bed and is knocking on the side of the bed.  Allergies: Patient has no known allergies.  Medications: Prior to Admission medications   Not on File    Vital Signs: BP 140/79   Pulse 76   Temp (!) 100.6 F (38.1 C) (Axillary)   Resp (!) 24   Ht  (1.803 m)   Wt 192 lb (87.1 kg)   SpO2 95%   BMI 26.78 kg/m   Physical Exam: Abd: soft, drain in place with dried old blood around the site.  Drain with minimal serousang output.  Imaging: No results found.  Labs:  CBC:  Recent Labs  12/01/16 0530 12/03/16 0517 12/05/16 0410 12/07/16 0456  WBC 10.2 11.0* 14.4* 6.6  HGB 8.3* 7.9* 8.4* 8.7*  HCT 24.4* 24.1* 26.0* 27.7*  PLT 517* 579* 613* 593*    COAGS:  Recent Labs  11/05/16 0409 11/06/16 0500 11/30/16 1356  INR 1.15 1.32 1.29  APTT  --   --  34    BMP:  Recent Labs  12/01/16 0530 12/03/16 0517 12/05/16 0410 12/07/16 0456  NA 133* 133* 134* 137  K 3.7 4.0 4.6 3.9  CL 99* 100* 101 100*  CO2 GLUCOSE 113* 132* 115* 115*  BUN CALCIUM 8.0* 7.9* 7.0* 8.6*  CREATININE 0.59* 0.57* 0.55* 0.50*  GFRNONAA >60 >60 >60 >60  GFRAA >60 >60 >60 >60    LIVER FUNCTION TESTS:  Recent Labs  11/26/16 0911 11/30/16 0510 12/03/16 0517 12/07/16 0456  BILITOT 1.9* 2.0* 1.1 0.8  AST 34 34 30 40  ALT 31 33 32 56  ALKPHOS 83 117 175* 196*  PROT 5.1* 5.7* 6.2* 7.3  ALBUMIN 1.4* 1.5* 1.6* 1.7*    Assessment and Plan: 1. Intra-abdominal abscess, s/p perc drain   Minimal drainage from JP drain.  Will plan for repeat CT scan tomorrow to evaluate this fluid collection.  Electronically Signed: Letha Cape 12/07/2016, 1:26 PM   I spent a total of 15 Minutes at the  the patient's bedside AND on the patient's hospital floor or unit, greater than 50% of which was counseling/coordinating care for intra-abdominal abscess

## 2016-12-07 NOTE — Progress Notes (Signed)
PICC line was slightly kinked under the dressing. Once the line was adjusted it flushed and gave good blood return. Nurse made aware. Consuello Masse
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
## 2016-12-08 ENCOUNTER — Inpatient Hospital Stay (HOSPITAL_COMMUNITY): Payer: Self-pay

## 2016-12-08 LAB — GLUCOSE, CAPILLARY
GLUCOSE-CAPILLARY: 100 mg/dL — AB (ref 65–99)
GLUCOSE-CAPILLARY: 112 mg/dL — AB (ref 65–99)
GLUCOSE-CAPILLARY: 115 mg/dL — AB (ref 65–99)
Glucose-Capillary: 113 mg/dL — ABNORMAL HIGH (ref 65–99)
Glucose-Capillary: 114 mg/dL — ABNORMAL HIGH (ref 65–99)
Glucose-Capillary: 116 mg/dL — ABNORMAL HIGH (ref 65–99)

## 2016-12-08 MED ORDER — IOPAMIDOL (ISOVUE-300) INJECTION 61%
INTRAVENOUS | Status: AC
  Administered 2016-12-08: 100 mL
  Filled 2016-12-08: qty 100

## 2016-12-08 MED ORDER — CLONAZEPAM 1 MG PO TABS
2.0000 mg | ORAL_TABLET | Freq: Three times a day (TID) | ORAL | Status: DC
  Administered 2016-12-08 – 2016-12-15 (×20): 2 mg via ORAL
  Filled 2016-12-08 (×21): qty 2

## 2016-12-08 MED ORDER — QUETIAPINE FUMARATE 100 MG PO TABS
100.0000 mg | ORAL_TABLET | Freq: Three times a day (TID) | ORAL | Status: DC
  Administered 2016-12-08 (×3): 100 mg
  Filled 2016-12-08 (×3): qty 1

## 2016-12-08 MED ORDER — CHLORHEXIDINE GLUCONATE 0.12 % MT SOLN
15.0000 mL | Freq: Two times a day (BID) | OROMUCOSAL | Status: DC
  Administered 2016-12-08 – 2016-12-15 (×14): 15 mL via OROMUCOSAL
  Filled 2016-12-08 (×11): qty 15

## 2016-12-08 MED ORDER — ORAL CARE MOUTH RINSE
15.0000 mL | Freq: Two times a day (BID) | OROMUCOSAL | Status: DC
  Administered 2016-12-09 – 2016-12-13 (×8): 15 mL via OROMUCOSAL

## 2016-12-08 NOTE — Consult Note (Signed)
WOC Nurse wound follow up Wound type: HAPI device related chin from cervical collar Measurement:3cm x 1cm x 0.2cm  Wound bed:100% pink Drainage (amount, consistency, odor) serosanguinous, no odor Periwound: intact  Dressing procedure/placement/frequency: dressing is staying moist due to patient secretions, his mouth is wired shut and it is difficult to contain his saliva. Will switch to silver hydrofiber now that the wound is clean, cover with foam.  Will switch the dressing with next dressing change.  WOC Nurse team will follow along with you for weekly wound assessments.  Please notify me of any acute changes in the wounds or any new areas of concerns Manmeet Arzola [facility] MSN, RN,CWOCN, CNS, CWON-AP [phone]
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
## 2016-12-08 NOTE — Progress Notes (Signed)
Referring Physician(s): Trauma MD Elwyn Lade MD  Supervising Physician: Irish Lack  Patient Status:  [facility] - In-pt  Chief Complaint:  Ischemic small bowel peforation  Subjective:  Abscess drain placed 9/19 CT yesterday looks great' IMPRESSION: 1. Marked improvement in prior abscesses. There is no significant residual abscess collection around the pigtail drainage catheter, correlate with any output from the strain in deciding whether to remove. Most of the other abscesses have either resolved or are dramatically smaller. The two remaining potential abscesses are a 4 cubic cm phlegmon or abscess in the upper abdomen midline just posterior to the upper margin of the wound (this abscess was previously 77 cubic cm), and a 23 cubic cm fluid collection just above the posterior urinary bladder (this abscess was previously 150 cubic cm  OP minimal Afeb Wbc wnl  Removal per Dr Fredia Sorrow  Allergies: Patient has no known allergies.  Medications: Prior to Admission medications   Not on File     Vital Signs: BP 125/69   Pulse 64   Temp 98.4 F (36.9 C) (Axillary)   Resp 14   Ht  (1.803 m)   Wt 190 lb (86.2 kg)   SpO2 100%   BMI 26.50 kg/m   Physical Exam  Skin: Skin is warm and dry.  Drain removal without complication Dressing placed    Imaging: Ct Abdomen Pelvis W Contrast  Result Date: 12/08/2016 CLINICAL DATA:  Abdominal pain and fever. Possible abscess. Abdominal vacuum assisted was tear. Laparotomy and bowel resection. EXAM: CT ABDOMEN AND PELVIS WITH CONTRAST TECHNIQUE: Multidetector CT imaging of the abdomen and pelvis was performed using the standard protocol following bolus administration of intravenous contrast. CONTRAST:  ISOVUE-300 IOPAMIDOL (ISOVUE-300) INJECTION 61% COMPARISON:  Multiple exams, including 11/29/2016 FINDINGS: Streak artifact from the patient's arms extends across the upper abdominal structures. Lower chest: Mild to  moderate enlargement of the cardiopericardial silhouette. At least moderate bilateral pleural effusions with atelectasis of the lower lobes. A nasogastric tube enters the stomach and terminates in the duodenal bulb. Hepatobiliary: New contracted gallbladder. No discrete liver lesion identified. Pancreas: Unremarkable Spleen: Unremarkable Adrenals/Urinary Tract: Foley catheter in the urinary bladder along with a small amount of gas in the urinary bladder. No hydronephrosis or hydroureter. No findings of pyelonephritis. Stomach/Bowel: Today' s exam was performed without oral contrast which can leads difficulties in separating loops of bowel from adjacent abscesses. Postoperative findings in right-sided loops of small bowel. New new new new no extraluminal gas is identified. New in general the small bowel loops are mildly thick walled with adjacent mesenteric edema. Fluid-filled colon, potentially from diarrheal process. Vascular/Lymphatic: Unremarkable Reproductive: Unremarkable. Other: Left lower quadrant Pete healed drainage catheter appears to have successfully drained the associated abscess, as there is no significant surrounding fluid collection currently. In the pelvis in just above the vaginal cuff there is a 2.6 by 3.7 by 4.5 cm (volume = 23 cm^3) localized fluid collection with enhancing margins in the, this previously measured 7.0 by 4.6 by 8.7 cm (volume = 150 cm^3). In the upper abdomen just along the posterior urinary bladder there is a 2.5 by 1.5 by 2.0 cm (volume = 3.9 cm^3) phlegmon or abscess shown on image 34/3, previously this measured 3.6 by 7.0 by 5.8 cm (volume = 77 cm^3). Along the wound site which is healing by secondary intention, there are some new linear collections of high density along the upper omentum on images 27-43 series 3. Presumably this may represent some type  of suture material or packing/ filling material. Strictly speaking, gossypyboma is not totally excluded but is considered  less likely given the morphology and appearance. The other abscesses appear to have resolved. Diffuse mesenteric edema. Presacral edema. Small amount of ascites for example along the right paracolic gutter. Musculoskeletal: Known right lateral rib fractures are partially included on today' s exam. On image 48/3, there is a 1.0 by 1.0 by 1.6 cm of rim enhancement in the right psoas muscle just above the vertical level of the iliac crest, with intermediate central density, this was not present on the prior exam from 11/29/2016. IMPRESSION: 1. Marked improvement in prior abscesses. There is no significant residual abscess collection around the pigtail drainage catheter, correlate with any output from the strain in deciding whether to remove. Most of the other abscesses have either resolved or are dramatically smaller. The two remaining potential abscesses are a 4 cubic cm phlegmon or abscess in the upper abdomen midline just posterior to the upper margin of the wound (this abscess was previously 77 cubic cm), and a 23 cubic cm fluid collection just above the posterior urinary bladder (this abscess was previously 150 cubic cm). 2. There are some new presumed postoperative findings including irregular high density is along the upper wound site which are presumably related to suture material or possibly dystrophic calcification. Strictly speaking, gossypyboma can have a similar appearance but based on the morphology is considered less likely. Correlate with sponge counts and previous operative material used for repair along the upper wound site. 3. New 1.0 by 1.0 by 0.6 cm focus of rim enhancement or high rim density in the right psoas muscle, potentially a small psoas hematoma or small abscess. 4. Diffuse small bowel wall thickening and mild mesenteric and presacral edema. Small amount of ascites. 5. Bilateral pleural effusions with bilateral lower lobe atelectasis. 6. Mild-to-moderate cardiomegaly, abnormal for age. 7.  The enteric tube is in the duodenal bulb. 8. Right rib fractures as previously diagnosed. Electronically Signed   By: Gaylyn Rong M.D.   On: 12/08/2016 08:37    Labs:  CBC:  Recent Labs  12/01/16 0530 12/03/16 0517 12/05/16 0410 12/07/16 0456  WBC 10.2 11.0* 14.4* 6.6  HGB 8.3* 7.9* 8.4* 8.7*  HCT 24.4* 24.1* 26.0* 27.7*  PLT 517* 579* 613* 593*    COAGS:  Recent Labs  11/05/16 0409 11/06/16 0500 11/30/16 1356  INR 1.15 1.32 1.29  APTT  --   --  34    BMP:  Recent Labs  12/01/16 0530 12/03/16 0517 12/05/16 0410 12/07/16 0456  NA 133* 133* 134* 137  K 3.7 4.0 4.6 3.9  CL 99* 100* 101 100*  CO2 GLUCOSE 113* 132* 115* 115*  BUN CALCIUM 8.0* 7.9* 7.0* 8.6*  CREATININE 0.59* 0.57* 0.55* 0.50*  GFRNONAA >60 >60 >60 >60  GFRAA >60 >60 >60 >60    LIVER FUNCTION TESTS:  Recent Labs  11/26/16 0911 11/30/16 0510 12/03/16 0517 12/07/16 0456  BILITOT 1.9* 2.0* 1.1 0.8  AST 34 34 30 40  ALT 31 33 32 56  ALKPHOS 83 117 175* 196*  PROT 5.1* 5.7* 6.2* 7.3  ALBUMIN 1.4* 1.5* 1.6* 1.7*    Assessment and Plan:  abd abscess resolved Drain removed  Electronically Signed: Gerrett Loman A, PA-C 12/08/2016, 9:33 AM   I spent a total of 15 Minutes at the the patient's bedside AND on the patient's hospital floor or unit, greater than 50%  of which was counseling/coordinating care for abd abscess drain removal

## 2016-12-08 NOTE — Progress Notes (Signed)
Wasted 60 mL of versed in sink with Verneda Skill, RN.

## 2016-12-09 ENCOUNTER — Inpatient Hospital Stay (HOSPITAL_COMMUNITY): Payer: Self-pay

## 2016-12-09 LAB — GLUCOSE, CAPILLARY
GLUCOSE-CAPILLARY: 123 mg/dL — AB (ref 65–99)
GLUCOSE-CAPILLARY: 110 mg/dL — AB (ref 65–99)
Glucose-Capillary: 114 mg/dL — ABNORMAL HIGH (ref 65–99)
Glucose-Capillary: 115 mg/dL — ABNORMAL HIGH (ref 65–99)

## 2016-12-09 LAB — CBC
HCT: 28.3 % — ABNORMAL LOW (ref 39.0–52.0)
HEMOGLOBIN: 9 g/dL — AB (ref 13.0–17.0)
MCH: 28.7 pg (ref 26.0–34.0)
MCHC: 31.8 g/dL (ref 30.0–36.0)
MCV: 90.1 fL (ref 78.0–100.0)
Platelets: 604 10*3/uL — ABNORMAL HIGH (ref 150–400)
RBC: 3.14 MIL/uL — AB (ref 4.22–5.81)
RDW: 16.6 % — ABNORMAL HIGH (ref 11.5–15.5)
WBC: 8.1 10*3/uL (ref 4.0–10.5)

## 2016-12-09 MED ORDER — ONDANSETRON HCL 4 MG/2ML IJ SOLN
INTRAMUSCULAR | Status: AC
  Administered 2016-12-09: 4 mg
  Filled 2016-12-09: qty 2

## 2016-12-09 MED ORDER — METOCLOPRAMIDE HCL 5 MG/ML IJ SOLN
10.0000 mg | Freq: Four times a day (QID) | INTRAMUSCULAR | Status: DC
  Administered 2016-12-09 – 2016-12-15 (×24): 10 mg via INTRAVENOUS
  Filled 2016-12-09 (×25): qty 2

## 2016-12-09 MED ORDER — QUETIAPINE FUMARATE 100 MG PO TABS
200.0000 mg | ORAL_TABLET | Freq: Three times a day (TID) | ORAL | Status: DC
  Administered 2016-12-09 – 2016-12-13 (×14): 200 mg
  Filled 2016-12-09: qty 2
  Filled 2016-12-09 (×3): qty 1
  Filled 2016-12-09 (×3): qty 2
  Filled 2016-12-09: qty 8
  Filled 2016-12-09 (×2): qty 2
  Filled 2016-12-09: qty 1
  Filled 2016-12-09: qty 2
  Filled 2016-12-09 (×2): qty 1

## 2016-12-09 MED ORDER — PROCHLORPERAZINE EDISYLATE 5 MG/ML IJ SOLN
10.0000 mg | Freq: Four times a day (QID) | INTRAMUSCULAR | Status: DC | PRN
  Administered 2016-12-09 (×2): 10 mg via INTRAVENOUS
  Filled 2016-12-09 (×3): qty 2

## 2016-12-09 MED ORDER — ONDANSETRON HCL 4 MG/2ML IJ SOLN
4.0000 mg | Freq: Four times a day (QID) | INTRAMUSCULAR | Status: DC | PRN
  Administered 2016-12-09 – 2016-12-14 (×3): 4 mg via INTRAVENOUS
  Filled 2016-12-09 (×4): qty 2
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
## 2016-12-09 NOTE — Progress Notes (Signed)
SLP Cancellation Note  Patient Details Name: BRALON ANTKOWIAK MRN: [id number] DOB: January 10, 1991   Cancelled treatment:   Pt was being seen for cognition/PMV placement; Cuff was deflated and valve placed at intervals of 1-3 breath cycles.  Pt appeared to be uncomfortable and indicated he did not want the valve on, at which point it was removed and pt began vomiting.  Cuff was immediately reinflated and RN arrived at room.  Pt with no decrease in Sp02.  Called RT and asked her to ensure adequate cuff pressure.  Will attempt PMV again next date.        Blenda Mounts Laurice 12/09/2016, 1:28 PM

## 2016-12-09 NOTE — Progress Notes (Signed)
Pt vomiting.  Trach cuff inflated.  RN in room.
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
## 2016-12-10 DIAGNOSIS — S42101A Fracture of unspecified part of scapula, right shoulder, initial encounter for closed fracture: Secondary | ICD-10-CM

## 2016-12-10 DIAGNOSIS — S069X9A Unspecified intracranial injury with loss of consciousness of unspecified duration, initial encounter: Secondary | ICD-10-CM

## 2016-12-10 DIAGNOSIS — S143XXA Injury of brachial plexus, initial encounter: Secondary | ICD-10-CM

## 2016-12-10 LAB — CBC
HEMATOCRIT: 29.1 % — AB (ref 39.0–52.0)
Hemoglobin: 9.4 g/dL — ABNORMAL LOW (ref 13.0–17.0)
MCH: 29.1 pg (ref 26.0–34.0)
MCHC: 32.3 g/dL (ref 30.0–36.0)
MCV: 90.1 fL (ref 78.0–100.0)
PLATELETS: 630 10*3/uL — AB (ref 150–400)
RBC: 3.23 MIL/uL — ABNORMAL LOW (ref 4.22–5.81)
RDW: 16.8 % — AB (ref 11.5–15.5)
WBC: 8.6 10*3/uL (ref 4.0–10.5)

## 2016-12-10 LAB — GLUCOSE, CAPILLARY
GLUCOSE-CAPILLARY: 106 mg/dL — AB (ref 65–99)
GLUCOSE-CAPILLARY: 112 mg/dL — AB (ref 65–99)
GLUCOSE-CAPILLARY: 93 mg/dL (ref 65–99)
Glucose-Capillary: 105 mg/dL — ABNORMAL HIGH (ref 65–99)
Glucose-Capillary: 99 mg/dL (ref 65–99)
Glucose-Capillary: 105 mg/dL — ABNORMAL HIGH (ref 65–99)
Glucose-Capillary: 100 mg/dL — ABNORMAL HIGH (ref 65–99)
Glucose-Capillary: 109 mg/dL — ABNORMAL HIGH (ref 65–99)

## 2016-12-10 MED ORDER — BETHANECHOL CHLORIDE 25 MG PO TABS
25.0000 mg | ORAL_TABLET | Freq: Three times a day (TID) | ORAL | Status: DC
  Administered 2016-12-10 – 2016-12-14 (×15): 25 mg
  Filled 2016-12-10 (×2): qty 1
  Filled 2016-12-10: qty 3
  Filled 2016-12-10 (×4): qty 1
  Filled 2016-12-10 (×2): qty 3
  Filled 2016-12-10 (×5): qty 1
  Filled 2016-12-10: qty 3
  Filled 2016-12-10: qty 1

## 2016-12-10 MED ORDER — PIVOT 1.5 CAL PO LIQD
1000.0000 mL | ORAL | Status: DC
  Administered 2016-12-10 – 2016-12-11 (×2): 1000 mL

## 2016-12-10 NOTE — Progress Notes (Signed)
30 cc of IV fentanyl from gtt wasted into sink when we d/c'd pt's gtt. Waste witnessed by Katheran James RN
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
## 2016-12-11 LAB — BASIC METABOLIC PANEL
ANION GAP: 6 (ref 5–15)
BUN: 9 mg/dL (ref 6–20)
CHLORIDE: 100 mmol/L — AB (ref 101–111)
CO2: 31 mmol/L (ref 22–32)
Calcium: 9 mg/dL (ref 8.9–10.3)
Creatinine, Ser: 0.62 mg/dL (ref 0.61–1.24)
GFR calc non Af Amer: >60 mL/min (ref >60)
Glucose, Bld: 114 mg/dL — ABNORMAL HIGH (ref 65–99)
POTASSIUM: 3.3 mmol/L — AB (ref 3.5–5.1)
SODIUM: 137 mmol/L (ref 135–145)

## 2016-12-11 LAB — GLUCOSE, CAPILLARY
GLUCOSE-CAPILLARY: 112 mg/dL — AB (ref 65–99)
GLUCOSE-CAPILLARY: 117 mg/dL — AB (ref 65–99)
Glucose-Capillary: 129 mg/dL — ABNORMAL HIGH (ref 65–99)
Glucose-Capillary: 108 mg/dL — ABNORMAL HIGH (ref 65–99)

## 2016-12-11 MED ORDER — FENTANYL CITRATE (PF) 100 MCG/2ML IJ SOLN
INTRAMUSCULAR | Status: AC
  Filled 2016-12-11: qty 2

## 2016-12-11 MED ORDER — FENTANYL CITRATE (PF) 100 MCG/2ML IJ SOLN
25.0000 ug | INTRAMUSCULAR | Status: DC | PRN
  Administered 2016-12-11 – 2016-12-13 (×8): 75 ug via INTRAVENOUS
  Administered 2016-12-14: 25 ug via INTRAVENOUS
  Filled 2016-12-11 (×8): qty 2

## 2016-12-11 MED ORDER — PANTOPRAZOLE SODIUM 40 MG PO PACK
40.0000 mg | PACK | Freq: Every day | ORAL | Status: DC
  Administered 2016-12-11 – 2016-12-15 (×5): 40 mg
  Filled 2016-12-11 (×5): qty 20

## 2016-12-11 MED ORDER — POTASSIUM CHLORIDE 20 MEQ/15ML (10%) PO SOLN
30.0000 meq | Freq: Once | ORAL | Status: AC
  Administered 2016-12-11: 30 meq via ORAL
  Filled 2016-12-11: qty 30

## 2016-12-11 MED ORDER — PIVOT 1.5 CAL PO LIQD
1000.0000 mL | ORAL | Status: DC
  Administered 2016-12-12: 1000 mL
  Filled 2016-12-11 (×3): qty 1000

## 2016-12-11 MED ORDER — POTASSIUM CHLORIDE CRYS ER 20 MEQ PO TBCR: 30.0000 meq | EXTENDED_RELEASE_TABLET | Freq: Once | ORAL | Status: DC
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
## 2016-12-11 NOTE — Procedures (Addendum)
Tracheostomy Change Note  Patient Details:   Name: EPIC TRIBBETT DOB: [birth year] MRN: [id number]    Airway Documentation:     Evaluation  O2 sats: stable throughout Complications: No apparent complications Patient did tolerate procedure well. Bilateral Breath Sounds: Rhonchi   RT used tube exchanger, ETCO2 detector had good color change, bilateral breath sounds.       Forest Becker Idona Stach 12/11/2016, 1:05 PM

## 2016-12-11 NOTE — Progress Notes (Signed)
Rehab admissions - I am following for potential acute inpatient rehab admission once patient is medically stable.  Mom is not here at this time, but is due back this pm.  I will try to meet with mom later this afternoon.  Call me for questions.  #[phone]
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
## 2016-12-12 ENCOUNTER — Encounter (HOSPITAL_COMMUNITY): Payer: Self-pay

## 2016-12-12 LAB — GLUCOSE, CAPILLARY
GLUCOSE-CAPILLARY: 123 mg/dL — AB (ref 65–99)
GLUCOSE-CAPILLARY: 126 mg/dL — AB (ref 65–99)
GLUCOSE-CAPILLARY: 107 mg/dL — AB (ref 65–99)
Glucose-Capillary: 116 mg/dL — ABNORMAL HIGH (ref 65–99)
Glucose-Capillary: 124 mg/dL — ABNORMAL HIGH (ref 65–99)
Glucose-Capillary: 126 mg/dL — ABNORMAL HIGH (ref 65–99)

## 2016-12-12 LAB — BASIC METABOLIC PANEL
Anion gap: 9 (ref 5–15)
BUN: 9 mg/dL (ref 6–20)
CALCIUM: 9.4 mg/dL (ref 8.9–10.3)
CHLORIDE: 100 mmol/L — AB (ref 101–111)
CO2: 29 mmol/L (ref 22–32)
CREATININE: 0.6 mg/dL — AB (ref 0.61–1.24)
GFR calc Af Amer: >60 mL/min (ref >60)
GFR calc non Af Amer: >60 mL/min (ref >60)
Glucose, Bld: 114 mg/dL — ABNORMAL HIGH (ref 65–99)
Potassium: 3.7 mmol/L (ref 3.5–5.1)
SODIUM: 138 mmol/L (ref 135–145)

## 2016-12-12 LAB — MAGNESIUM: Magnesium: 1.6 mg/dL — ABNORMAL LOW (ref 1.7–2.4)

## 2016-12-12 MED ORDER — MORPHINE SULFATE (PF) 2 MG/ML IV SOLN
1.0000 mg | INTRAVENOUS | Status: DC | PRN
  Administered 2016-12-12: 2 mg via INTRAVENOUS
  Administered 2016-12-12: 3 mg via INTRAVENOUS
  Filled 2016-12-12: qty 1
  Filled 2016-12-12: qty 2

## 2016-12-12 NOTE — Progress Notes (Signed)
Patient noted to be swinging legs out of bed.  Specialty low bed ordered for pt.  Floor mats placed.
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
## 2016-12-12 NOTE — Progress Notes (Signed)
Patient repeatedly pulling oxygen collar off of trach.  Patient has been repositioned repeatedly.  Patient has been educated as to the need to leave collar in place.  Patient has been redirected repeatedly.  Patient has been given trach care and suctioned repeatedly.  Patient's right soft wrist restraint has been tightened.  All of patient's pain and/or anxiety medications have been administered.  Patient's mother has repeatedly redirected patient. Patient continues to move in bed into a position where he can removed the oxygen collar.  PA for Trauma service has been paged.

## 2016-12-13 ENCOUNTER — Encounter (HOSPITAL_COMMUNITY): Payer: Self-pay | Admitting: *Deleted

## 2016-12-13 LAB — GLUCOSE, CAPILLARY
GLUCOSE-CAPILLARY: 111 mg/dL — AB (ref 65–99)
Glucose-Capillary: 104 mg/dL — ABNORMAL HIGH (ref 65–99)
Glucose-Capillary: 109 mg/dL — ABNORMAL HIGH (ref 65–99)
Glucose-Capillary: 124 mg/dL — ABNORMAL HIGH (ref 65–99)
Glucose-Capillary: 131 mg/dL — ABNORMAL HIGH (ref 65–99)
Glucose-Capillary: 133 mg/dL — ABNORMAL HIGH (ref 65–99)

## 2016-12-13 LAB — URINALYSIS, ROUTINE W REFLEX MICROSCOPIC
BILIRUBIN URINE: NEGATIVE
GLUCOSE, UA: NEGATIVE mg/dL
KETONES UR: NEGATIVE mg/dL
LEUKOCYTES UA: NEGATIVE
NITRITE: NEGATIVE
PH: 7 (ref 5.0–8.0)
Protein, ur: 100 mg/dL — AB
Specific Gravity, Urine: 1.018 (ref 1.005–1.030)
Squamous Epithelial / LPF: NONE SEEN

## 2016-12-13 MED ORDER — PIVOT 1.5 CAL PO LIQD
1000.0000 mL | ORAL | Status: DC
  Filled 2016-12-13 (×3): qty 1000
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
## 2016-12-14 ENCOUNTER — Inpatient Hospital Stay (HOSPITAL_COMMUNITY): Payer: Self-pay

## 2016-12-14 LAB — GLUCOSE, CAPILLARY
GLUCOSE-CAPILLARY: 112 mg/dL — AB (ref 65–99)
GLUCOSE-CAPILLARY: 109 mg/dL — AB (ref 65–99)
GLUCOSE-CAPILLARY: 118 mg/dL — AB (ref 65–99)
Glucose-Capillary: 120 mg/dL — ABNORMAL HIGH (ref 65–99)
Glucose-Capillary: 120 mg/dL — ABNORMAL HIGH (ref 65–99)
Glucose-Capillary: 121 mg/dL — ABNORMAL HIGH (ref 65–99)

## 2016-12-14 MED ORDER — QUETIAPINE FUMARATE 100 MG PO TABS
200.0000 mg | ORAL_TABLET | Freq: Three times a day (TID) | ORAL | Status: DC
  Administered 2016-12-14 (×3): 200 mg
  Filled 2016-12-14 (×3): qty 2

## 2016-12-14 MED ORDER — FENTANYL CITRATE (PF) 100 MCG/2ML IJ SOLN: 25.0000 ug | INTRAMUSCULAR | Status: DC | PRN

## 2016-12-14 MED ORDER — PREGABALIN 50 MG PO CAPS
50.0000 mg | ORAL_CAPSULE | Freq: Three times a day (TID) | ORAL | Status: DC
  Administered 2016-12-14 – 2016-12-15 (×5): 50 mg
  Filled 2016-12-14 (×5): qty 1

## 2016-12-14 MED ORDER — JEVITY 1.2 CAL PO LIQD: 1000.0000 mL | ORAL | Status: DC

## 2016-12-14 MED ORDER — QUETIAPINE FUMARATE 100 MG PO TABS: 300.0000 mg | ORAL_TABLET | Freq: Three times a day (TID) | ORAL | Status: DC

## 2016-12-14 MED ORDER — PIVOT 1.5 CAL PO LIQD
1000.0000 mL | ORAL | Status: DC
  Administered 2016-12-14: 1000 mL
  Filled 2016-12-14 (×3): qty 1000

## 2016-12-14 MED ORDER — HYDROCODONE-ACETAMINOPHEN 7.5-325 MG/15ML PO SOLN
10.0000 mL | ORAL | Status: DC | PRN
  Administered 2016-12-14 – 2016-12-15 (×2): 10 mL via ORAL
  Filled 2016-12-14 (×2): qty 15
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
## 2016-12-14 NOTE — Progress Notes (Signed)
Pt leaving floor for CT. RT will assess upon return
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
## 2016-12-15 ENCOUNTER — Encounter (HOSPITAL_COMMUNITY): Payer: Self-pay | Admitting: Emergency Medicine

## 2016-12-15 ENCOUNTER — Inpatient Hospital Stay (HOSPITAL_COMMUNITY)
Admission: RE | Admit: 2016-12-15 | Payer: MEDICAID | Source: Intra-hospital | Admitting: Physical Medicine & Rehabilitation

## 2016-12-15 ENCOUNTER — Inpatient Hospital Stay (HOSPITAL_COMMUNITY)
Admission: RE | Admit: 2016-12-15 | Discharge: 2017-01-01 | DRG: 945 | Disposition: A | Discharge status: Home Health | Payer: Self-pay | Source: Intra-hospital | Attending: Physical Medicine & Rehabilitation | Admitting: Physical Medicine & Rehabilitation

## 2016-12-15 DIAGNOSIS — I10 Essential (primary) hypertension: Secondary | ICD-10-CM | POA: Diagnosis present

## 2016-12-15 DIAGNOSIS — R1312 Dysphagia, oropharyngeal phase: Secondary | ICD-10-CM

## 2016-12-15 DIAGNOSIS — S0081XD Abrasion of other part of head, subsequent encounter: Secondary | ICD-10-CM

## 2016-12-15 DIAGNOSIS — S0240CD Maxillary fracture, right side, subsequent encounter for fracture with routine healing: Secondary | ICD-10-CM

## 2016-12-15 DIAGNOSIS — S0291XB Unspecified fracture of skull, initial encounter for open fracture: Secondary | ICD-10-CM | POA: Diagnosis present

## 2016-12-15 DIAGNOSIS — S066X9D Traumatic subarachnoid hemorrhage with loss of consciousness of unspecified duration, subsequent encounter: Principal | ICD-10-CM

## 2016-12-15 DIAGNOSIS — R339 Retention of urine, unspecified: Secondary | ICD-10-CM | POA: Diagnosis present

## 2016-12-15 DIAGNOSIS — R1310 Dysphagia, unspecified: Secondary | ICD-10-CM | POA: Diagnosis present

## 2016-12-15 DIAGNOSIS — S022XXD Fracture of nasal bones, subsequent encounter for fracture with routine healing: Secondary | ICD-10-CM

## 2016-12-15 DIAGNOSIS — S069X3S Unspecified intracranial injury with loss of consciousness of 1 hour to 5 hours 59 minutes, sequela: Secondary | ICD-10-CM

## 2016-12-15 DIAGNOSIS — S02609D Fracture of mandible, unspecified, subsequent encounter for fracture with routine healing: Secondary | ICD-10-CM

## 2016-12-15 DIAGNOSIS — Z930 Tracheostomy status: Secondary | ICD-10-CM

## 2016-12-15 DIAGNOSIS — Z23 Encounter for immunization: Secondary | ICD-10-CM

## 2016-12-15 DIAGNOSIS — F17210 Nicotine dependence, cigarettes, uncomplicated: Secondary | ICD-10-CM | POA: Diagnosis present

## 2016-12-15 DIAGNOSIS — T79A3XD Traumatic compartment syndrome of abdomen, subsequent encounter: Secondary | ICD-10-CM

## 2016-12-15 DIAGNOSIS — S12600D Unspecified displaced fracture of seventh cervical vertebra, subsequent encounter for fracture with routine healing: Secondary | ICD-10-CM

## 2016-12-15 DIAGNOSIS — S42101D Fracture of unspecified part of scapula, right shoulder, subsequent encounter for fracture with routine healing: Secondary | ICD-10-CM

## 2016-12-15 DIAGNOSIS — S062X3S Diffuse traumatic brain injury with loss of consciousness of 1 hour to 5 hours 59 minutes, sequela: Secondary | ICD-10-CM

## 2016-12-15 DIAGNOSIS — D62 Acute posthemorrhagic anemia: Secondary | ICD-10-CM | POA: Diagnosis present

## 2016-12-15 DIAGNOSIS — S0281XD Fracture of other specified skull and facial bones, right side, subsequent encounter for fracture with routine healing: Secondary | ICD-10-CM

## 2016-12-15 DIAGNOSIS — S143XXD Injury of brachial plexus, subsequent encounter: Secondary | ICD-10-CM

## 2016-12-15 DIAGNOSIS — S143XXA Injury of brachial plexus, initial encounter: Secondary | ICD-10-CM | POA: Diagnosis present

## 2016-12-15 HISTORY — DX: Nausea with vomiting, unspecified: R11.2

## 2016-12-15 HISTORY — DX: Other specified health status: Z78.9

## 2016-12-15 HISTORY — DX: Other specified postprocedural states: Z98.890

## 2016-12-15 LAB — CBC
HEMATOCRIT: 33.6 % — AB (ref 39.0–52.0)
HEMATOCRIT: 33.3 % — AB (ref 39.0–52.0)
HEMOGLOBIN: 10.1 g/dL — AB (ref 13.0–17.0)
HEMOGLOBIN: 10.5 g/dL — AB (ref 13.0–17.0)
MCH: 27.4 pg (ref 26.0–34.0)
MCH: 28.5 pg (ref 26.0–34.0)
MCHC: 30.1 g/dL (ref 30.0–36.0)
MCHC: 31.5 g/dL (ref 30.0–36.0)
MCV: 91.3 fL (ref 78.0–100.0)
MCV: 90.5 fL (ref 78.0–100.0)
Platelets: 590 10*3/uL — ABNORMAL HIGH (ref 150–400)
Platelets: 586 10*3/uL — ABNORMAL HIGH (ref 150–400)
RBC: 3.68 MIL/uL — ABNORMAL LOW (ref 4.22–5.81)
RBC: 3.68 MIL/uL — AB (ref 4.22–5.81)
RDW: 16 % — ABNORMAL HIGH (ref 11.5–15.5)
RDW: 16 % — ABNORMAL HIGH (ref 11.5–15.5)
WBC: 9.5 10*3/uL (ref 4.0–10.5)
WBC: 10 10*3/uL (ref 4.0–10.5)

## 2016-12-15 LAB — BASIC METABOLIC PANEL
Anion gap: 9 (ref 5–15)
BUN: 18 mg/dL (ref 6–20)
CALCIUM: 9.7 mg/dL (ref 8.9–10.3)
CO2: 32 mmol/L (ref 22–32)
CREATININE: 0.64 mg/dL (ref 0.61–1.24)
Chloride: 99 mmol/L — ABNORMAL LOW (ref 101–111)
Glucose, Bld: 123 mg/dL — ABNORMAL HIGH (ref 65–99)
Potassium: 4.1 mmol/L (ref 3.5–5.1)
Sodium: 140 mmol/L (ref 135–145)

## 2016-12-15 LAB — CREATININE, SERUM
Creatinine, Ser: 0.69 mg/dL (ref 0.61–1.24)
GFR calc non Af Amer: >60 mL/min (ref >60)

## 2016-12-15 LAB — GLUCOSE, CAPILLARY
GLUCOSE-CAPILLARY: 116 mg/dL — AB (ref 65–99)
GLUCOSE-CAPILLARY: 134 mg/dL — AB (ref 65–99)
GLUCOSE-CAPILLARY: 120 mg/dL — AB (ref 65–99)
GLUCOSE-CAPILLARY: 123 mg/dL — AB (ref 65–99)
Glucose-Capillary: 106 mg/dL — ABNORMAL HIGH (ref 65–99)

## 2016-12-15 MED ORDER — PREGABALIN 50 MG PO CAPS
50.0000 mg | ORAL_CAPSULE | Freq: Three times a day (TID) | ORAL | Status: DC
  Administered 2016-12-15 – 2016-12-20 (×16): 50 mg
  Filled 2016-12-15 (×16): qty 1

## 2016-12-15 MED ORDER — ENOXAPARIN SODIUM 40 MG/0.4ML ~~LOC~~ SOLN: 40.0000 mg | SUBCUTANEOUS | Status: DC

## 2016-12-15 MED ORDER — INSULIN ASPART 100 UNIT/ML ~~LOC~~ SOLN
0.0000 [IU] | SUBCUTANEOUS | Status: DC
  Administered 2016-12-16 – 2016-12-18 (×3): 2 [IU] via SUBCUTANEOUS
  Administered 2016-12-18: 3 [IU] via SUBCUTANEOUS
  Administered 2016-12-18 – 2016-12-19 (×3): 2 [IU] via SUBCUTANEOUS
  Administered 2016-12-19: 3 [IU] via SUBCUTANEOUS

## 2016-12-15 MED ORDER — ONDANSETRON HCL 4 MG/2ML IJ SOLN: 4.0000 mg | Freq: Four times a day (QID) | INTRAMUSCULAR | Status: DC | PRN

## 2016-12-15 MED ORDER — SODIUM CHLORIDE 0.9% FLUSH
10.0000 mL | INTRAVENOUS | Status: DC | PRN
  Administered 2016-12-15: 10 mL
  Administered 2016-12-17: 30 mL
  Filled 2016-12-15 (×2): qty 40

## 2016-12-15 MED ORDER — FENTANYL CITRATE (PF) 100 MCG/2ML IJ SOLN: 25.0000 ug | INTRAMUSCULAR | Status: DC | PRN

## 2016-12-15 MED ORDER — ONDANSETRON HCL 4 MG/2ML IJ SOLN: 4.0000 mg | Freq: Once | INTRAMUSCULAR | Status: DC | PRN

## 2016-12-15 MED ORDER — PANTOPRAZOLE SODIUM 40 MG PO PACK
40.0000 mg | PACK | Freq: Every day | ORAL | Status: DC
  Administered 2016-12-16 – 2016-12-20 (×5): 40 mg
  Filled 2016-12-15 (×2): qty 20

## 2016-12-15 MED ORDER — PROCHLORPERAZINE EDISYLATE 5 MG/ML IJ SOLN
10.0000 mg | Freq: Four times a day (QID) | INTRAMUSCULAR | Status: DC | PRN
  Administered 2016-12-16 (×2): 10 mg via INTRAVENOUS
  Filled 2016-12-15: qty 2

## 2016-12-15 MED ORDER — QUETIAPINE FUMARATE 50 MG PO TABS
150.0000 mg | ORAL_TABLET | Freq: Three times a day (TID) | ORAL | Status: DC
  Administered 2016-12-15 – 2016-12-20 (×16): 150 mg
  Filled 2016-12-15 (×10): qty 1
  Filled 2016-12-15: qty 2
  Filled 2016-12-15 (×6): qty 1

## 2016-12-15 MED ORDER — MEPERIDINE HCL 25 MG/ML IJ SOLN: 6.2500 mg | INTRAMUSCULAR | Status: DC | PRN

## 2016-12-15 MED ORDER — CLONAZEPAM 0.5 MG PO TABS
2.0000 mg | ORAL_TABLET | Freq: Three times a day (TID) | ORAL | Status: DC | PRN
  Administered 2016-12-16 (×2): 2 mg
  Filled 2016-12-15 (×3): qty 4

## 2016-12-15 MED ORDER — CLONAZEPAM 1 MG PO TABS: 2.0000 mg | ORAL_TABLET | Freq: Three times a day (TID) | ORAL | Status: DC | PRN

## 2016-12-15 MED ORDER — BACITRACIN ZINC 500 UNIT/GM EX OINT
TOPICAL_OINTMENT | Freq: Two times a day (BID) | CUTANEOUS | Status: DC
  Administered 2016-12-15: 23:00:00 via TOPICAL
  Administered 2016-12-16: 31.5556 via TOPICAL
  Administered 2016-12-16 – 2016-12-19 (×6): via TOPICAL
  Administered 2016-12-20 (×2): 31.5556 via TOPICAL
  Administered 2016-12-21: 09:00:00 via TOPICAL
  Administered 2016-12-22: 31.5556 via TOPICAL
  Filled 2016-12-15 (×7): qty 28.35

## 2016-12-15 MED ORDER — HYDROCODONE-ACETAMINOPHEN 7.5-325 MG/15ML PO SOLN
10.0000 mL | ORAL | Status: DC | PRN
  Administered 2016-12-15 – 2016-12-19 (×8): 10 mL
  Filled 2016-12-15 (×8): qty 15

## 2016-12-15 MED ORDER — BETHANECHOL CHLORIDE 10 MG PO TABS
10.0000 mg | ORAL_TABLET | Freq: Three times a day (TID) | ORAL | Status: DC
  Administered 2016-12-15 – 2016-12-18 (×7): 10 mg
  Filled 2016-12-15 (×8): qty 1

## 2016-12-15 MED ORDER — BETHANECHOL CHLORIDE 10 MG PO TABS
10.0000 mg | ORAL_TABLET | Freq: Three times a day (TID) | ORAL | Status: DC
  Administered 2016-12-15 (×2): 10 mg
  Filled 2016-12-15 (×3): qty 1

## 2016-12-15 MED ORDER — ENOXAPARIN SODIUM 40 MG/0.4ML ~~LOC~~ SOLN
40.0000 mg | Freq: Every day | SUBCUTANEOUS | Status: DC
Start: 2016-12-16 — End: 2017-01-01
  Administered 2016-12-16 – 2016-12-27 (×10): 40 mg via SUBCUTANEOUS
  Filled 2016-12-15 (×15): qty 0.4

## 2016-12-15 MED ORDER — METOCLOPRAMIDE HCL 5 MG/ML IJ SOLN
10.0000 mg | Freq: Four times a day (QID) | INTRAMUSCULAR | Status: DC
  Administered 2016-12-15 – 2016-12-17 (×7): 10 mg via INTRAVENOUS
  Filled 2016-12-15 (×6): qty 2

## 2016-12-15 MED ORDER — QUETIAPINE FUMARATE 25 MG PO TABS
150.0000 mg | ORAL_TABLET | Freq: Three times a day (TID) | ORAL | Status: DC
  Administered 2016-12-15 (×2): 150 mg
  Filled 2016-12-15 (×2): qty 1

## 2016-12-15 MED ORDER — PIVOT 1.5 CAL PO LIQD
1000.0000 mL | ORAL | Status: DC
Start: 2016-12-16 — End: 2016-12-16
  Filled 2016-12-15 (×2): qty 1000

## 2016-12-15 MED ORDER — ACETAMINOPHEN 160 MG/5ML PO SOLN: 650.0000 mg | Freq: Four times a day (QID) | ORAL | Status: DC | PRN

## 2016-12-15 MED ORDER — POLYETHYLENE GLYCOL 3350 17 G PO PACK
17.0000 g | PACK | Freq: Every day | ORAL | Status: DC
  Administered 2016-12-16 – 2016-12-20 (×5): 17 g
  Filled 2016-12-15 (×5): qty 1

## 2016-12-15 MED ORDER — POLYETHYLENE GLYCOL 3350 17 G PO PACK
17.0000 g | PACK | Freq: Every day | ORAL | Status: DC
  Administered 2016-12-15: 17 g
  Filled 2016-12-15: qty 1
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
## 2016-12-15 NOTE — Progress Notes (Signed)
Rehab admissions - I met with mom this am.  Patient currently working with therapy.  Mom does want inpatient rehab admission.  We do have beds available and will plan to admit to CIR today.  I will meet with mom again around 1215 pm today to firm up plan.  Call me for questions.  #[phone]
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
## 2016-12-15 NOTE — H&P (Deleted)
  The note originally documented on this encounter has been moved the the encounter in which it belongs.  

## 2016-12-15 NOTE — Progress Notes (Signed)
Orthopedic Tech Progress Note Patient Details:  Chase Ayala 1990-11-18 [id number]  Ortho Devices Type of Ortho Device: Sling immobilizer Ortho Device/Splint Location: lue Ortho Device/Splint Interventions: Ordered, Application, Adjustment   [name] 12/15/2016, 9:50 AM
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
## 2016-12-15 NOTE — Progress Notes (Signed)
Late entry-  This RT was unavailable for noon trach check.  Charge RT aware. Trach change ordered today.  Initially trach was unavailable Banker ordered).  Then, when trach available- this RT and RT x 3 unavailable to change trach d/t multiple emergencies and procedures.  Charge RT aware.  Ok Anis RRT took over pt care at approximately 1530 today and aware of above.
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
## 2016-12-16 ENCOUNTER — Encounter (HOSPITAL_COMMUNITY): Payer: Self-pay

## 2016-12-16 ENCOUNTER — Inpatient Hospital Stay (HOSPITAL_COMMUNITY): Payer: Self-pay | Admitting: Physical Therapy

## 2016-12-16 ENCOUNTER — Inpatient Hospital Stay (HOSPITAL_COMMUNITY): Payer: Self-pay | Admitting: Occupational Therapy

## 2016-12-16 ENCOUNTER — Inpatient Hospital Stay (HOSPITAL_COMMUNITY): Payer: Self-pay | Admitting: Speech Pathology

## 2016-12-16 DIAGNOSIS — S143XXS Injury of brachial plexus, sequela: Secondary | ICD-10-CM

## 2016-12-16 LAB — COMPREHENSIVE METABOLIC PANEL
ALBUMIN: 2.9 g/dL — AB (ref 3.5–5.0)
ALK PHOS: 218 U/L — AB (ref 38–126)
ALT: 50 U/L (ref 17–63)
ANION GAP: 11 (ref 5–15)
AST: 42 U/L — AB (ref 15–41)
BILIRUBIN TOTAL: 0.9 mg/dL (ref 0.3–1.2)
BUN: 14 mg/dL (ref 6–20)
CO2: 32 mmol/L (ref 22–32)
Calcium: 9.9 mg/dL (ref 8.9–10.3)
Chloride: 94 mmol/L — ABNORMAL LOW (ref 101–111)
Creatinine, Ser: 0.83 mg/dL (ref 0.61–1.24)
GFR calc Af Amer: >60 mL/min (ref >60)
GLUCOSE: 127 mg/dL — AB (ref 65–99)
POTASSIUM: 3.7 mmol/L (ref 3.5–5.1)
SODIUM: 137 mmol/L (ref 135–145)
TOTAL PROTEIN: 8.5 g/dL — AB (ref 6.5–8.1)

## 2016-12-16 LAB — GLUCOSE, CAPILLARY
GLUCOSE-CAPILLARY: 113 mg/dL — AB (ref 65–99)
GLUCOSE-CAPILLARY: 113 mg/dL — AB (ref 65–99)
GLUCOSE-CAPILLARY: 95 mg/dL (ref 65–99)
Glucose-Capillary: 104 mg/dL — ABNORMAL HIGH (ref 65–99)
Glucose-Capillary: 129 mg/dL — ABNORMAL HIGH (ref 65–99)
Glucose-Capillary: 149 mg/dL — ABNORMAL HIGH (ref 65–99)

## 2016-12-16 LAB — CBC WITH DIFFERENTIAL/PLATELET
BASOS ABS: 0 10*3/uL (ref 0.0–0.1)
BASOS PCT: 0 %
EOS ABS: 0.4 10*3/uL (ref 0.0–0.7)
Eosinophils Relative: 4 %
HCT: 33.6 % — ABNORMAL LOW (ref 39.0–52.0)
HEMOGLOBIN: 10.4 g/dL — AB (ref 13.0–17.0)
Lymphocytes Relative: 20 %
Lymphs Abs: 2.1 10*3/uL (ref 0.7–4.0)
MCH: 28 pg (ref 26.0–34.0)
MCHC: 31 g/dL (ref 30.0–36.0)
MCV: 90.3 fL (ref 78.0–100.0)
Monocytes Absolute: 0.6 10*3/uL (ref 0.1–1.0)
Monocytes Relative: 6 %
NEUTROS ABS: 7.6 10*3/uL (ref 1.7–7.7)
NEUTROS PCT: 70 %
Platelets: 583 10*3/uL — ABNORMAL HIGH (ref 150–400)
RBC: 3.72 MIL/uL — AB (ref 4.22–5.81)
RDW: 15.7 % — ABNORMAL HIGH (ref 11.5–15.5)
WBC: 10.8 10*3/uL — AB (ref 4.0–10.5)

## 2016-12-16 MED ORDER — PRO-STAT SUGAR FREE PO LIQD
30.0000 mL | Freq: Three times a day (TID) | ORAL | Status: DC
  Administered 2016-12-17 – 2016-12-22 (×13): 30 mL via ORAL
  Filled 2016-12-16 (×17): qty 30

## 2016-12-16 MED ORDER — FLEET ENEMA 7-19 GM/118ML RE ENEM
1.0000 | ENEMA | Freq: Every day | RECTAL | Status: DC | PRN
  Administered 2016-12-16: 1 via RECTAL
  Filled 2016-12-16: qty 1

## 2016-12-16 MED ORDER — INFLUENZA VAC SPLIT QUAD 0.5 ML IM SUSY
0.5000 mL | PREFILLED_SYRINGE | INTRAMUSCULAR | Status: AC
  Administered 2016-12-17: 0.5 mL via INTRAMUSCULAR
  Filled 2016-12-16: qty 0.5

## 2016-12-16 MED ORDER — SODIUM CHLORIDE 0.9% FLUSH
10.0000 mL | INTRAVENOUS | Status: AC | PRN
  Administered 2016-12-16: 30 mL

## 2016-12-16 MED ORDER — BOOST / RESOURCE BREEZE PO LIQD
1.0000 | Freq: Three times a day (TID) | ORAL | Status: DC
  Administered 2016-12-16 – 2016-12-18 (×7): 1 via ORAL

## 2016-12-16 MED ORDER — COLLAGENASE 250 UNIT/GM EX OINT
TOPICAL_OINTMENT | Freq: Every day | CUTANEOUS | Status: DC
  Administered 2016-12-16 – 2016-12-24 (×9): via TOPICAL
  Filled 2016-12-16: qty 30

## 2016-12-16 NOTE — Progress Notes (Signed)
Trach changed per RT protocol to #6 cuff less Shiley per order, patient tolerated well SATS 100%, good color change on ETCO2 detector, good BBS auscult, patient tolerated well, will continue to monitor patient.
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
## 2016-12-16 NOTE — Consult Note (Signed)
WOC Nurse wound follow up Wound type: HAMDRPI (hospital acquired medical device related pressure injury) Measurement: 1cm x 3cm x 0.2cm  Wound bed:100% yellow, thick slough Drainage (amount, consistency, odor) none at the time of my assessment today Periwound: intact  Dressing procedure/placement/frequency: Enzymatic debridement ointment daily, cover with dry dressing.  No family in the room today at the time of my assessment  WOC Nurse team will follow along with you for weekly wound assessments.  Please notify me of any acute changes in the wounds or any new areas of concerns Krist[facility] MSN, RN,CWOCN, CNS, CWON-AP [phone]
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
## 2016-12-16 NOTE — Progress Notes (Signed)
Educated patient on the importance of wearing PMV while eating or drinking. RN at bedside. Moved bedside table away from patient as a precautionary measure to prevent drinking without PMV as pt was in the process of doing this. Continuous pulse ox set up at bedside.

## 2016-12-16 NOTE — Progress Notes (Signed)
Patient information reviewed and entered into [facility] system by Maysoon Lozada, RN, CRRN, PPS Coordinator.  Information including medical coding and functional independence measure will be reviewed and updated through discharge.    
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
## 2016-12-16 NOTE — Plan of Care (Signed)
Problem: RH BOWEL ELIMINATION Goal: RH STG MANAGE BOWEL WITH ASSISTANCE STG Manage Bowel with mod Assistance.  Outcome: Not Progressing No bowel movement since 9/26. Will disimpact

## 2016-12-16 NOTE — Progress Notes (Signed)
Pt refusing medications. Respitory demanding new trach change orders. No PEG so unable to use that method for meds. Vital signs high. Call into [facility] for orders. None given. Will see pt later. Pt agitated and uncooperative. Pulling at tubes and trach. Low bed with floor mats in place
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
## 2016-12-16 NOTE — Progress Notes (Signed)
Pt did not have any restraint on last night. Alert and oriented X4 and refused to have restraint on him. Pt was educated that if he keeps on trying to take the trach out, the restrained order will be implemented. He agrees with plan and verbalized understanding.

## 2016-12-17 ENCOUNTER — Inpatient Hospital Stay (HOSPITAL_COMMUNITY): Payer: Self-pay | Admitting: Occupational Therapy

## 2016-12-17 ENCOUNTER — Inpatient Hospital Stay (HOSPITAL_COMMUNITY): Payer: Self-pay

## 2016-12-17 ENCOUNTER — Inpatient Hospital Stay (HOSPITAL_COMMUNITY): Payer: Self-pay | Admitting: Speech Pathology

## 2016-12-17 ENCOUNTER — Inpatient Hospital Stay (HOSPITAL_COMMUNITY): Payer: Self-pay | Admitting: Physical Therapy

## 2016-12-17 DIAGNOSIS — M7989 Other specified soft tissue disorders: Secondary | ICD-10-CM

## 2016-12-17 LAB — GLUCOSE, CAPILLARY
GLUCOSE-CAPILLARY: 131 mg/dL — AB (ref 65–99)
GLUCOSE-CAPILLARY: 127 mg/dL — AB (ref 65–99)
GLUCOSE-CAPILLARY: 112 mg/dL — AB (ref 65–99)
Glucose-Capillary: 105 mg/dL — ABNORMAL HIGH (ref 65–99)
Glucose-Capillary: 121 mg/dL — ABNORMAL HIGH (ref 65–99)
Glucose-Capillary: 138 mg/dL — ABNORMAL HIGH (ref 65–99)

## 2016-12-17 MED ORDER — METOCLOPRAMIDE HCL 5 MG/5ML PO SOLN
10.0000 mg | Freq: Four times a day (QID) | ORAL | Status: DC
  Administered 2016-12-17 – 2016-12-21 (×10): 10 mg
  Filled 2016-12-17 (×15): qty 10
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
## 2016-12-17 NOTE — Progress Notes (Signed)
Occupational Therapy Session Note  Patient Details  Name: Chase Ayala MRN: [id number] Date of Birth: 02/16/1991  Today's Date: 12/17/2016 OT Individual Time: 7829-5621 OT Individual Time Calculation (min): 24 min  and Today's Date: 12/17/2016 OT Missed Time: 36 Minutes Missed Time Reason: Patient fatigue   Short Term Goals: Week 1:  OT Short Term Goal 1 (Week 1): Pt will perform LB dressing with mod A in order to increase I with self care. OT Short Term Goal 2 (Week 1): Pt will perform toilet transfer with min A in order to decrease level of assistance with self care.  OT Short Term Goal 3 (Week 1): Pt will standing with steady assistance during LB clothing management before and after toileting.  OT Short Term Goal 4 (Week 1): Pt will perform bathing with mod mutlimodal cues and increased time for task.   Skilled Therapeutic Interventions/Progress Updates:    Session 1: Upon entering the room, pt seated in wheelchair with mother present in room. Pt agreeable to OT intervention and asking to use bathroom. OT assisted pt into bathroom via wheelchair and pt initiating transfer by counting to three. Pt performed stand pivot transfer with mod A to standard toilet. Pt having BM with close supervision for balance while seated on toilet. Pt standing with min A but required assistance for hygiene and clothing management after toileting. Pt returning to wheelchair. Pt washing hand at sink with assistance from therapist. PROM to L UE digits, wrist, and elbow with pt stating increased pain with flexion. Pt's BP taken while seated in wheelchair with results of 160/115. RN arrived with medications and was notified. Pt returned to bed to rest with mod stand pivot transfer to bed and max A for sit >supine. Call bell within reach and bed alarm activated. PMSV removed as staff and mother exiting the room.   Session 2: Upon entering the room, pt sleeping soundly in bed. OT attempting to wake pt up but he  only opens eyes on occasion. OT notified RN who reports recently giving pt medication and that pt appeared very fatigued when she left. OT attempting to awaken pt again but he continues to have eyes closed. Pt on room air with O2 at 99% while sleeping. 36 missed minutes of OT intervention secondary to pt fatigue. Bed rails up, call bell within reach, and bed alarm activated for safety.   Therapy Documentation Precautions:  Precautions Precautions: Fall Precaution Comments: position LUE for good positioning of left shoulder and arm  (+) subluxation, abdominal incision Required Braces or Orthoses: Sling, Other Brace/Splint Other Brace/Splint:  trach collar, sling left arm when OOB/mobilizing.  Restrictions Weight Bearing Restrictions: Yes RUE Weight Bearing: Weight bearing as tolerated LUE Weight Bearing: Weight bearing as tolerated Other Position/Activity Restrictions: PROM for L UE digits, wrist, elbow, and shoulder General: General OT Amount of Missed Time: 36 Minutes Vital Signs: Therapy Vitals Pulse Rate: (!) 131 Resp: 18 Patient Position (if appropriate): Lying Oxygen Therapy SpO2: 100 % O2 Device: Tracheostomy Collar  See Function Navigator for Current Functional Status.   Therapy/Group: Individual Therapy  Ayala Bleacher 12/17/2016, 12:38 PM

## 2016-12-17 NOTE — Progress Notes (Signed)
Speech Language Pathology Daily Session Note  Patient Details  Name: Chase Ayala MRN: [id number] Date of Birth: [birth year]  Today's Date: 12/17/2016 SLP Individual Time: 1300-1320 SLP Individual Time Calculation (min): 20 min and Today's Date: 12/17/2016 SLP Missed Time: 40 Minutes Missed Time Reason: Patient fatigue  Short Term Goals: Week 1: SLP Short Term Goal 1 (Week 1): Patient will maximize speech intelligibility at the sentence level with Min A verbal cues for use of a slow rate and increased vocal intensity.  SLP Short Term Goal 2 (Week 1): Patient will consume current diet with minimal overt s/s of aspiration and Min A verbal cues for use of small sips.  SLP Short Term Goal 3 (Week 1): Patient will orient to place and time with Mod A verbal and visual cues.  SLP Short Term Goal 4 (Week 1): Patient will identify 1 physical and 1 cognitive deficit with Mod A mulitmodal cues.  SLP Short Term Goal 5 (Week 1): Patient will tolerate PMSV with full supervision with all vitals remaining WFL.  SLP Short Term Goal 6 (Week 1): Patient will sustain attention to a functional task for 5 minutes with Min A verbal cues fo redirection.   Skilled Therapeutic Interventions: Skilled treatment session focused on cognitive goals. Upon arrival, patient was asleep while supine in bed.  Patient was extremely lethargic and was unable to awaken despite multiple attempts and Max A multimodal cues. RN aware. HR and O2 were assessed and WFL. Patient left asleep while supine in bed with alarm on and all needs within reach. Patient missed remaining 40 minutes of session. Continue with current plan of care.      Function:  Cognition Comprehension Comprehension assist level: Understands basic less than 25% of the time/ requires cueing >75% of the time  Expression   Expression assist level: Expresses basic 50 - 74% of the time/requires cueing 25 - 49% of the time. Needs to repeat parts of sentences.   Social Interaction Social Interaction assist level: Interacts appropriately less than 25% of the time. May be withdrawn or combative.  Problem Solving Problem solving assist level: Solves basic less than 25% of the time - needs direction nearly all the time or does not effectively solve problems and may need a restraint for safety  Memory Memory assist level: Recognizes or recalls 25 - 49% of the time/requires cueing 50 - 75% of the time    Pain Pain Assessment Pain Assessment: No/denies pain Faces Pain Scale: No hurt  Therapy/Group: Individual Therapy  Chase Ayala 12/17/2016, 3:32 PM

## 2016-12-17 NOTE — Care Management Note (Signed)
[facility] Individual Statement of Services  Patient Name:  Chase Ayala  Date:  12/17/2016  Welcome to the [facility].  Our goal is to provide you with an individualized program based on your diagnosis and situation, designed to meet your specific needs.  With this comprehensive rehabilitation program, you will be expected to participate in at least 3 hours of rehabilitation therapies Monday-Friday, with modified therapy programming on the weekends.  Your rehabilitation program will include the following services:  Physical Therapy (PT), Occupational Therapy (OT), Speech Therapy (ST), 24 hour per day rehabilitation nursing, Therapeutic Recreaction (TR), Neuropsychology, Case Management (Social Worker), Rehabilitation Medicine, Nutrition Services and Pharmacy Services  Weekly team conferences will be held on Tuesdays to discuss your progress.  Your Social Worker will talk with you frequently to get your input and to update you on team discussions.  Team conferences with you and your family in attendance may also be held.  Expected length of stay: 3 weeks  Overall anticipated outcome: minimal assistance  Depending on your progress and recovery, your program may change. Your Social Worker will coordinate services and will keep you informed of any changes. Your Social Worker's name and contact numbers are listed  below.  The following services may also be recommended but are not provided by the [facility]:   Driving Evaluations  Home Health Rehabiltiation Services  Outpatient Rehabilitation Services  Vocational Rehabilitation   Arrangements will be made to provide these services after discharge if needed.  Arrangements include referral to agencies that provide these services.  Your insurance has been verified to be:  None (will discuss with financial counseling about medicaid/ disability applications) Your primary  doctor is:  None  Pertinent information will be shared with your doctor and your insurance company.  Social Worker:  [place], [place] [phone] or [phone]   Information discussed with and copy given to patient by: Amada Jupiter, 12/17/2016, 1:10 PM
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
## 2016-12-17 NOTE — Progress Notes (Signed)
Occupational Therapy Session Note  Patient Details  Name: Chase Ayala MRN: [id number] Date of Birth: 14-Oct-1990  Today's Date: 12/17/2016 OT Individual Time: 1400-1430 OT Individual Time Calculation (min): 30 min    Short Term Goals: Week 1:  OT Short Term Goal 1 (Week 1): Pt will perform LB dressing with mod A in order to increase I with self care. OT Short Term Goal 2 (Week 1): Pt will perform toilet transfer with min A in order to decrease level of assistance with self care.  OT Short Term Goal 3 (Week 1): Pt will standing with steady assistance during LB clothing management before and after toileting.  OT Short Term Goal 4 (Week 1): Pt will perform bathing with mod mutlimodal cues and increased time for task.   Skilled Therapeutic Interventions/Progress Updates:   Upon entering the room, RN present and pt with no c/o pain. Pt requesting to eat lunch during this session. Pt requires mod A for supine >sit to EOB. Pt sitting with close supervision - min A for balance to drink liquids. Pt required set up A to open containers . Pt required verbal cues and min A for upright posture while seated on EOB. Pt transferred from bed >wheelchair with mod lifting assistance and pt counting for transfer to initiate movement. Pt seated wheelchair with quick release belt donned and wheelchair tilted back. RN notified. Call bell within reach upon exiting the room.    Therapy Documentation Precautions:  Precautions Precautions: Fall Precaution Comments: position LUE for good positioning of left shoulder and arm  (+) subluxation, abdominal incision Required Braces or Orthoses: Sling, Other Brace/Splint Other Brace/Splint:  trach collar, sling left arm when OOB/mobilizing.  Restrictions Weight Bearing Restrictions: Yes RUE Weight Bearing: Weight bearing as tolerated LUE Weight Bearing: Weight bearing as tolerated Other Position/Activity Restrictions: PROM for L UE digits, wrist, elbow, and  shoulder General: General OT Amount of Missed Time: 36 Minutes Vital Signs: Therapy Vitals Temp: 98.7 F (37.1 C) Temp Source: Oral Pulse Rate: (!) 134 Resp: 18 BP: (!) 148/87 Patient Position (if appropriate): Lying Oxygen Therapy SpO2: 99 % O2 Device: Tracheostomy Collar Pain: Pain Assessment Pain Assessment: No/denies pain Faces Pain Scale: No hurt  See Function Navigator for Current Functional Status.   Therapy/Group: Individual Therapy  Alen Bleacher 12/17/2016, 4:17 PM

## 2016-12-17 NOTE — Progress Notes (Signed)
Physical Therapy Session Note  Patient Details  Name: Chase Ayala Nurse MRN: [id number] Date of Birth: 1990-05-13  Today's Date: 12/17/2016 PT Individual Time: 0800-0900 PT Individual Time Calculation (min): 60 min   Short Term Goals: Week 1:  PT Short Term Goal 1 (Week 1): Pt will remain up in WC >2 hours without change in vitals  PT Short Term Goal 2 (Week 1): Pt will perform bed mobility with min assist.  PT Short Term Goal 3 (Week 1): Pt will transfer with stand pivot and min assist consistantly  PT Short Term Goal 4 (Week 1): Pt will ambulate 63ft with mod assist   Skilled Therapeutic Interventions/Progress Updates: Pt received seated in bed, denies pain and agreeable to treatment. Vitals supine at rest 151/94 HR 120bpm. Supine>sit with S and bedrails, HOB elevated, increased time and cues for scooting hips to EOB. Requires maxA to don shirt; cues for hemi-technique however pt continues to don shirt with RUE first and then requires assistance with LUE and pulling shirt over head. HR elevated to 136 following donning shirt. Returned to supine for rest break while RN changed abdominal dressing. Pt engaged in basic level conversation with therapist regarding hobbies and things he enjoys doing; pt reports he enjoys sleeping, "staying clean", and watching tv including naming several shows he enjoys. Pt appropriate in all conversation, however flat affect. Pt returned to EOB minA for managing LUE. Pt dons underwear and pants with min guard for sitting balance d/t consistent lean to R side. Pt dons shoes with setupA and increased time; does not untie/tie shoes to don. Stand pivot transfer to w/c modA with staggering steps. BP assessed as below in sitting; RN alerted. Adjusted w/c back and headrest for comfort. Remained seated in w/c at end of session with mother present; telesitter active and all needs in reach.      Therapy Documentation Precautions:  Precautions Precautions:  Fall Precaution Comments: position LUE for good positioning of left shoulder and arm  (+) subluxation, abdominal incision Required Braces or Orthoses: Sling, Other Brace/Splint Other Brace/Splint:  trach collar, sling left arm when OOB/mobilizing.  Restrictions Weight Bearing Restrictions: Yes RUE Weight Bearing: Weight bearing as tolerated LUE Weight Bearing: Weight bearing as tolerated Other Position/Activity Restrictions: PROM for L UE digits, wrist, elbow, and shoulder Vital Signs: Therapy Vitals Pulse Rate: (!) 122 Resp: 18 BP: (!) 163/108 Patient Position (if appropriate): Lying Oxygen Therapy SpO2: 98 % O2 Device: Tracheostomy Collar  See Function Navigator for Current Functional Status.   Therapy/Group: Individual Therapy  Vista Lawman 12/17/2016, 10:20 AM

## 2016-12-17 NOTE — Progress Notes (Signed)
*  PRELIMINARY RESULTS* Vascular Ultrasound Lower extremity venous duplex has been completed.  Preliminary findings: No evidence of DVT in visualized veins or baker's cyst.    Farrel Demark, RDMS, RVT  12/17/2016, 3:30 PM

## 2016-12-18 ENCOUNTER — Inpatient Hospital Stay (HOSPITAL_COMMUNITY): Payer: Self-pay | Admitting: Physical Therapy

## 2016-12-18 ENCOUNTER — Inpatient Hospital Stay (HOSPITAL_COMMUNITY): Payer: Self-pay | Admitting: Speech Pathology

## 2016-12-18 ENCOUNTER — Inpatient Hospital Stay (HOSPITAL_COMMUNITY): Payer: Self-pay

## 2016-12-18 ENCOUNTER — Inpatient Hospital Stay (HOSPITAL_COMMUNITY): Payer: Self-pay | Admitting: Occupational Therapy

## 2016-12-18 LAB — GLUCOSE, CAPILLARY
GLUCOSE-CAPILLARY: 100 mg/dL — AB (ref 65–99)
GLUCOSE-CAPILLARY: 108 mg/dL — AB (ref 65–99)
GLUCOSE-CAPILLARY: 110 mg/dL — AB (ref 65–99)
GLUCOSE-CAPILLARY: 128 mg/dL — AB (ref 65–99)
GLUCOSE-CAPILLARY: 96 mg/dL (ref 65–99)
Glucose-Capillary: 137 mg/dL — ABNORMAL HIGH (ref 65–99)

## 2016-12-18 MED ORDER — BOOST / RESOURCE BREEZE PO LIQD
1.0000 | Freq: Four times a day (QID) | ORAL | Status: DC
  Administered 2016-12-18 – 2016-12-24 (×16): 1 via ORAL

## 2016-12-18 MED ORDER — METOPROLOL TARTRATE 25 MG/10 ML ORAL SUSPENSION
12.5000 mg | Freq: Two times a day (BID) | ORAL | Status: DC
  Administered 2016-12-18 – 2016-12-20 (×5): 12.5 mg via ORAL
  Filled 2016-12-18 (×5): qty 10
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
## 2016-12-18 NOTE — Progress Notes (Signed)
Once meds are fixewd in clear liquids refusing all medication. Options given. Refuse when mother encourages.

## 2016-12-18 NOTE — Progress Notes (Signed)
Occupational Therapy Session Note  Patient Details  Name: Chase Ayala MRN: [id number] Date of Birth: 1991-02-07  Today's Date: 12/18/2016 OT Individual Time: 1348-1430 OT Individual Time Calculation (min): 42 min    Short Term Goals: Week 1:  OT Short Term Goal 1 (Week 1): Pt will perform LB dressing with mod A in order to increase I with self care. OT Short Term Goal 2 (Week 1): Pt will perform toilet transfer with min A in order to decrease level of assistance with self care.  OT Short Term Goal 3 (Week 1): Pt will standing with steady assistance during LB clothing management before and after toileting.  OT Short Term Goal 4 (Week 1): Pt will perform bathing with mod mutlimodal cues and increased time for task.   Skilled Therapeutic Interventions/Progress Updates:    1:1. Pt completes stand pivot transfer EOB<>w/c<>toielt with MIN A for balnce and VC for initiation. Pt completes clothing management with touching A for balance and OT provides A for hygiene. Pt completes wii bowling in seated with supervision and standing with touching A and 2 LOB with MOD A to recover. Pt has tendency to stagger BLE when standing, OT provided education on proper BOS prior to sit to stand to improve balance. Exited session with pt seated in w/c with QRB donned, call light in reach, mother present and all needs met.  Therapy Documentation Precautions:  Precautions Precautions: Fall Precaution Comments: position LUE for good positioning of left shoulder and arm  (+) subluxation, abdominal incision Required Braces or Orthoses: Sling, Other Brace/Splint Other Brace/Splint:  trach collar, sling left arm when OOB/mobilizing.  Restrictions Weight Bearing Restrictions: Yes RUE Weight Bearing: Weight bearing as tolerated LUE Weight Bearing: Weight bearing as tolerated Other Position/Activity Restrictions: PROM for L UE digits, wrist, elbow, and shoulder  See Function Navigator for Current Functional  Status.   Therapy/Group: Individual Therapy  Tonny Branch 12/18/2016, 2:22 PM
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
## 2016-12-18 NOTE — Procedures (Signed)
Tracheostomy Change Note  Patient Details:   Name: Chase Ayala DOB: 1990-10-10 MRN: [id number]    Airway Documentation:     Evaluation  O2 sats: stable throughout Complications: No apparent complications Patient did tolerate procedure well. Bilateral Breath Sounds: Clear, Diminished    #4 shiley uncuffed was placed  PT stas are sable  RT at bedside to monitor   [name], [name] 12/18/2016, 3:47 PM
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
## 2016-12-18 NOTE — Progress Notes (Signed)
Spoke with Mother regarding NG tube request and patient being hungry. Explained to Mother that patient is on the needed nutrition and he will feel hungry due to him taking in liquids. Explained to Mother that we will advance his diet as he is able to tolerate. Explained to Mother possible complications with the NG tube. Mother verbalized understanding and agreed not to have NG tube placed at this time.

## 2016-12-18 NOTE — Progress Notes (Signed)
Physical Therapy Note  Patient Details  Name: Chase Ayala MRN: [id number] Date of Birth: September 04, 1990 Today's Date: 12/18/2016  1500-1550, 50 min individual tx; missed 10 min during session due to Respiratory Therapy changing trach Pain: none per pt  Pt supine in bed, cooperative.  O2 sats 100%,on 5L wall oxygen through trach with PMV in place.; HR 120 before activity. Pt remembered 2/3 words of 3-Word Memory test over 10 min  Supine in bed, therapeutic ex: R/L straight leg raises, R/L hip abduction/adduction, bil ankle pumps,  HR after ex 117.  2 x 10 bil bridging and lower trunk rotation in flat bed.  Pt sat EOB x 3 minutes, 10 x 1 each R/L hip flex and bil shoulder adduction (L active assistive).  HR elevated to 123, so pt lay back down.  HR after 2 min = 121.  In side lying, 10 x 1 each L hip abduction with flexed knee and L ankle eversion. Pt left resting in bed in care of Melanie, RN giving meds. All needs within reach and mother in room.    See function navigator for current status.    Rani Sisney 12/18/2016, 3:19 PM

## 2016-12-18 NOTE — Progress Notes (Signed)
Pt resting in bed quietly. Easily aroused. C/o pain in arm. Pain management administered and effective. Mom at bedside and wanted to be present for medication administration to discourage noted behaviors of pt. Requests when wires in mouth will be cut. Encouraged to speak with provider during am rounds. Tele sitter at bedside. Pt is able to use urinal independently but requires staff to manage. abdminal drsg changed this am. Trach care completed by respiratory therapy this shift. Left arm with splint per order. Pt is noted to be confused between day and night, around 2256 he was looking for his breakfast tray and was reoriented to time successfully. callbell within reach. Safety maintained. Will continue to monitor.

## 2016-12-18 NOTE — Progress Notes (Signed)
Speech Language Pathology Daily Session Note  Patient Details  Name: Rasmus Preusser MRN: [id number] Date of Birth: September 17, 1990  Today's Date: 12/18/2016 SLP Individual Time: 1300-1340 SLP Individual Time Calculation (min): 40 min  Short Term Goals: Week 1: SLP Short Term Goal 1 (Week 1): Patient will maximize speech intelligibility at the sentence level with Min A verbal cues for use of a slow rate and increased vocal intensity.  SLP Short Term Goal 2 (Week 1): Patient will consume current diet with minimal overt s/s of aspiration and Min A verbal cues for use of small sips.  SLP Short Term Goal 3 (Week 1): Patient will orient to place and time with Mod A verbal and visual cues.  SLP Short Term Goal 4 (Week 1): Patient will identify 1 physical and 1 cognitive deficit with Mod A mulitmodal cues.  SLP Short Term Goal 5 (Week 1): Patient will tolerate PMSV with full supervision with all vitals remaining WFL.  SLP Short Term Goal 6 (Week 1): Patient will sustain attention to a functional task for 5 minutes with Min A verbal cues fo redirection.   Skilled Therapeutic Interventions: Skilled treatment session focused on dysphagia and cognitive goals. SLP facilitated session by providing Min A verbal cues for use of small sips with a thin liquids via straw. Patient consumed clear liquid diet with overt cough X 1, suspect due to large sips. Recommend patient continue current diet. Patient had wires removed today, however, unclear of patient's current diet restrictions. Patient's mother present and requested for patient's diet to be advanced or NG placed for supplemental nutrition. PA aware and reported he will attempt to clarify current diet restrictions with ENT. Patient disoriented to time and required Mod A verbal cues for reorientation but recalled conversation with ENT with Min A verbal cues. Patient's mother present and educated on patient's current cognitive function and goals of skilled SLP  intervention. She verbalized understanding and agreement. Continue with current plan of care.      Function:  Eating Eating   Modified Consistency Diet: Yes Eating Assist Level: Set up assist for;Supervision or verbal cues   Eating Set Up Assist For: Opening containers       Cognition Comprehension Comprehension assist level: Understands basic 75 - 89% of the time/ requires cueing 10 - 24% of the time  Expression Expression assistive device: Talk trach valve Expression assist level: Expresses basic 90% of the time/requires cueing < 10% of the time.  Social Interaction Social Interaction assist level: Interacts appropriately 75 - 89% of the time - Needs redirection for appropriate language or to initiate interaction.  Problem Solving Problem solving assist level: Solves basic less than 25% of the time - needs direction nearly all the time or does not effectively solve problems and may need a restraint for safety  Memory Memory assist level: Recognizes or recalls 25 - 49% of the time/requires cueing 50 - 75% of the time    Pain No/Denies Pain   Therapy/Group: Individual Therapy  Gentri Guardado 12/18/2016, 3:05 PM

## 2016-12-18 NOTE — Progress Notes (Signed)
Occupational Therapy Session Note  Patient Details  Name: Chase Ayala MRN: [id number] Date of Birth: 04-08-1990  Today's Date: 12/18/2016 OT Individual Time: 6237-6283 OT Individual Time Calculation (min): 54 min    Short Term Goals: Week 1:  OT Short Term Goal 1 (Week 1): Pt will perform LB dressing with mod A in order to increase I with self care. OT Short Term Goal 2 (Week 1): Pt will perform toilet transfer with min A in order to decrease level of assistance with self care.  OT Short Term Goal 3 (Week 1): Pt will standing with steady assistance during LB clothing management before and after toileting.  OT Short Term Goal 4 (Week 1): Pt will perform bathing with mod mutlimodal cues and increased time for task.   Skilled Therapeutic Interventions/Progress Updates:    1;1. Focus of session on hemi dressing, sitting balance, and stand pivot transfers. Pt supine> sitting EOB with touching A and VC for LUE management. Pt dresses EOB with step by step cueing for hemi techniques to don shirt. Pt dons pants threading LLE first and able to notice/problem solve when BLE were in the same pant leg with question cues. Pt dons B socks with demonstration cues on one handed technique. Pt stand pivot transfer with MIN A and VC for safety awareness and initiation EOB>w/c<>toilet. Pt voids bowel seated on toilet with increased time and A to complete clothing management after toileting and hygiene. OT adjusts headrest to improve sitting tolerance and pt comfort. Exited session with pt seated in w/c with QRB donned, mother in room, call light in reach and all needs met.  Vitals Assessed Prior to activity 151/93 HR 115 O2: 100% After dressing seated EOB: 160/110, HR: 144 and O2 100% (RN alerted and called MD. MD gave verbal order to continue with tx.   Therapy Documentation Precautions:  Precautions Precautions: Fall Precaution Comments: position LUE for good positioning of left shoulder and arm   (+) subluxation, abdominal incision Required Braces or Orthoses: Sling, Other Brace/Splint Other Brace/Splint:  trach collar, sling left arm when OOB/mobilizing.  Restrictions Weight Bearing Restrictions: Yes RUE Weight Bearing: Weight bearing as tolerated LUE Weight Bearing: Weight bearing as tolerated Other Position/Activity Restrictions: PROM for L UE digits, wrist, elbow, and shoulder General:  See Function Navigator for Current Functional Status.   Therapy/Group: Individual Therapy  Tonny Branch 12/18/2016, 9:25 AM

## 2016-12-19 ENCOUNTER — Inpatient Hospital Stay (HOSPITAL_COMMUNITY): Payer: Self-pay | Admitting: Speech Pathology

## 2016-12-19 ENCOUNTER — Inpatient Hospital Stay (HOSPITAL_COMMUNITY): Payer: Self-pay | Admitting: Physical Therapy

## 2016-12-19 ENCOUNTER — Inpatient Hospital Stay (HOSPITAL_COMMUNITY): Payer: Self-pay | Admitting: Occupational Therapy

## 2016-12-19 DIAGNOSIS — S143XXD Injury of brachial plexus, subsequent encounter: Secondary | ICD-10-CM

## 2016-12-19 DIAGNOSIS — S062X3S Diffuse traumatic brain injury with loss of consciousness of 1 hour to 5 hours 59 minutes, sequela: Secondary | ICD-10-CM

## 2016-12-19 LAB — GLUCOSE, CAPILLARY
GLUCOSE-CAPILLARY: 104 mg/dL — AB (ref 65–99)
GLUCOSE-CAPILLARY: 156 mg/dL — AB (ref 65–99)
Glucose-Capillary: 106 mg/dL — ABNORMAL HIGH (ref 65–99)
Glucose-Capillary: 111 mg/dL — ABNORMAL HIGH (ref 65–99)
Glucose-Capillary: 137 mg/dL — ABNORMAL HIGH (ref 65–99)
Glucose-Capillary: 58 mg/dL — ABNORMAL LOW (ref 65–99)
Glucose-Capillary: 97 mg/dL (ref 65–99)

## 2016-12-19 NOTE — Progress Notes (Signed)
Speech Language Pathology Daily Session Note  Patient Details  Name: Chase Ayala MRN: [id number] Date of Birth: 01-09-1991  Today's Date: 12/19/2016 SLP Individual Time: 1000-1030 SLP Individual Time Calculation (min): 30 min  Short Term Goals: Week 1: SLP Short Term Goal 1 (Week 1): Patient will maximize speech intelligibility at the sentence level with Min A verbal cues for use of a slow rate and increased vocal intensity.  SLP Short Term Goal 2 (Week 1): Patient will consume current diet with minimal overt s/s of aspiration and Min A verbal cues for use of small sips.  SLP Short Term Goal 3 (Week 1): Patient will orient to place and time with Mod A verbal and visual cues.  SLP Short Term Goal 4 (Week 1): Patient will identify 1 physical and 1 cognitive deficit with Mod A mulitmodal cues.  SLP Short Term Goal 5 (Week 1): Patient will tolerate PMSV with full supervision with all vitals remaining WFL.  SLP Short Term Goal 6 (Week 1): Patient will sustain attention to a functional task for 5 minutes with Min A verbal cues fo redirection.   Skilled Therapeutic Interventions: Skilled treatment session focused on cognitive goals. Upon arrival, PMSV was in place and vitals remained WFL throughout session. SLP facilitated session by providing Mod-Max A verbal cues for problem solving with a basic money management task. Suspect function was also impacted by decreased sustained attention to task. Patient demonstrated emergent awareness in regards to difficulty of task but reported difficulty with task was due to too much medication. Educated on TBI and the effects on his cognitive function. Patient left supine in bed with alarm on, PMSV removed and all needs within reach. Continue with current plan of care.      Function:  Cognition Comprehension Comprehension assist level: Understands basic 75 - 89% of the time/ requires cueing 10 - 24% of the time  Expression Expression assistive device:  Talk trach valve Expression assist level: Expresses basic 90% of the time/requires cueing < 10% of the time.  Social Interaction Social Interaction assist level: Interacts appropriately 75 - 89% of the time - Needs redirection for appropriate language or to initiate interaction.  Problem Solving Problem solving assist level: Solves basic 25 - 49% of the time - needs direction more than half the time to initiate, plan or complete simple activities  Memory Memory assist level: Recognizes or recalls 50 - 74% of the time/requires cueing 25 - 49% of the time    Pain No/Denies Pain   Therapy/Group: Individual Therapy  Keyvon Herter 12/19/2016, 12:09 PM

## 2016-12-19 NOTE — Progress Notes (Signed)
Physical Therapy Session Note  Patient Details  Name: Chase Ayala MRN: [id number] Date of Birth: [birth year]  Today's Date: 12/19/2016 PT Individual Time: 1430-1525 PT Individual Time Calculation (min): 55 min   Short Term Goals: Week 1:  PT Short Term Goal 1 (Week 1): Pt will remain up in WC >2 hours without change in vitals  PT Short Term Goal 2 (Week 1): Pt will perform bed mobility with min assist.  PT Short Term Goal 3 (Week 1): Pt will transfer with stand pivot and min assist consistantly  PT Short Term Goal 4 (Week 1): Pt will ambulate 58ft with mod assist   Skilled Therapeutic Interventions/Progress Updates:  Pt was seen bedside in the pm. Pt willing to participate with therapy. Pt transferred supine to edge of bed with min A and verbal cues. Pt tolerated edge of bed about 15 minutes with c/s to min guard with R UE support. Pt's HR fluctuated between 110 - 120 at edge of bed. Pt transferred sit to stand with min A,while standing HR increased to 140's , however quickly returned to baseline 110 to 120s when pt returned to edge of bed. After about 15 minutes pt c/o increased pain in shoulders and neck, pt requested to return to supine. Pt transferred edge of bed to supine with S. While supine in bed pt performed heel slides, hip abd/add, and SAQs, 2 sets x 10 reps each. Pt left sitting up in bed with call bell within reach and bed alarm on.   Therapy Documentation Precautions:  Precautions Precautions: Fall Precaution Comments: position LUE for good positioning of left shoulder and arm  (+) subluxation, abdominal incision Required Braces or Orthoses: Sling, Other Brace/Splint Other Brace/Splint:  trach collar, sling left arm when OOB/mobilizing.  Restrictions Weight Bearing Restrictions: Yes RUE Weight Bearing: Weight bearing as tolerated LUE Weight Bearing: Weight bearing as tolerated Other Position/Activity Restrictions: PROM for L UE digits, wrist, elbow, and  shoulder General:   Vital Signs: Therapy Vitals Oxygen Therapy SpO2: 100 % O2 Device: Tracheostomy Collar O2 Flow Rate (L/min): 5 L/min FiO2 (%): 28 % Pain: No c/o pain, states recently received pain medicine.   See Function Navigator for Current Functional Status.   Therapy/Group: Individual Therapy  Rayford Halsted 12/19/2016, 3:51 PM

## 2016-12-19 NOTE — Progress Notes (Signed)
Occupational Therapy Session Note  Patient Details  Name: Chase Ayala MRN: [id number] Date of Birth: [birth year]  Today's Date: 12/19/2016 OT Individual Time: 2536-6440 and 1300-1335 OT Individual Time Calculation (min): 70 min and 35 minutes = total of 105 minutes   Short Term Goals: Week 1:  OT Short Term Goal 1 (Week 1): Pt will perform LB dressing with mod A in order to increase I with self care. OT Short Term Goal 2 (Week 1): Pt will perform toilet transfer with min A in order to decrease level of assistance with self care.  OT Short Term Goal 3 (Week 1): Pt will standing with steady assistance during LB clothing management before and after toileting.  OT Short Term Goal 4 (Week 1): Pt will perform bathing with mod mutlimodal cues and increased time for task.   Skilled Therapeutic Interventions/Progress Updates:   1st session patient initially received supine in bed with HOBed eleveated.   He was able to follow simple one step commands with extra time and either tactile or verbal cues to focus and complete the tasks (3/3 tasks).    His blood pressure went up to 175/100 after brushing teeth edge of bed (oral care=moderate asssist and hand over hand to use left hand as stabilizer) and then repositioning into this bed (total to max assist - partially due to decreased problem solving)  2nd session  Patient participated in L mild UE PROM exercises ; bed mobility with total A - patient with difficulty following 1 step instructions for weight shift to help move himselfup in bed;  Right trapezius stretchin to help patient concentrate on following task (as he oftencomplainted that the right side back of his neck hurt) .  As well, disposable hot packs were applied to patient neck  At end of session, patient was left with his nurse who was attending to various care.     Therapy Documentation Precautions:  Precautions Precautions: Fall Precaution Comments: position LUE for good  positioning of left shoulder and arm  (+) subluxation, abdominal incision Required Braces or Orthoses: Sling, Other Brace/Splint Other Brace/Splint:  trach collar, sling left arm when OOB/mobilizing.  Restrictions Weight Bearing Restrictions: (P) Yes RUE Weight Bearing: Weight bearing as tolerated LUE Weight Bearing: Weight bearing as tolerated Other Position/Activity Restrictions: PROM for L UE digits, wrist, elbow, and shoulder    Vital Signs:  BP = 116/95 HR=15 before this therapy session.   Went up to 175/100 after patient completed sitting EOB to brush teeth and then repositioning in bed  Pain:complained of pain in upper back and right posterior lateral neck.   RN brought in pain meds.   This  cliniican applied disposable hot pack    Therapy/Group: Individual Therapy  Bud Face [facility] 12/19/2016, 4:39 PM
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
## 2016-12-20 ENCOUNTER — Inpatient Hospital Stay (HOSPITAL_COMMUNITY): Payer: Self-pay | Admitting: Occupational Therapy

## 2016-12-20 LAB — GLUCOSE, CAPILLARY
GLUCOSE-CAPILLARY: 103 mg/dL — AB (ref 65–99)
GLUCOSE-CAPILLARY: 96 mg/dL (ref 65–99)
Glucose-Capillary: 100 mg/dL — ABNORMAL HIGH (ref 65–99)

## 2016-12-20 MED ORDER — METOPROLOL TARTRATE 25 MG/10 ML ORAL SUSPENSION
25.0000 mg | Freq: Two times a day (BID) | ORAL | Status: DC
  Administered 2016-12-20 – 2016-12-22 (×4): 25 mg via ORAL
  Filled 2016-12-20 (×4): qty 10

## 2016-12-20 NOTE — Progress Notes (Signed)
Per RN trach became dislodged completely.  RN replaced trach before RT arrived.  Trach placement confirmed by BBS, passage of catheter and removal of moderate thick tan secretions.  Patient able to verbalize with PMV, sats 99-100% throughout.  Patient states that he "coughed it out".
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
## 2016-12-20 NOTE — Progress Notes (Signed)
Occupational Therapy Session Note  Patient Details  Name: Chase Ayala MRN: [id number] Date of Birth: 02-Nov-1990  Today's Date: 12/20/2016 OT Individual Time:  -  0900-1000  (60 min)      Short Term Goals: Week 1:  OT Short Term Goal 1 (Week 1): Pt will perform LB dressing with mod A in order to increase I with self care. OT Short Term Goal 2 (Week 1): Pt will perform toilet transfer with min A in order to decrease level of assistance with self care.  OT Short Term Goal 3 (Week 1): Pt will standing with steady assistance during LB clothing management before and after toileting.  OT Short Term Goal 4 (Week 1): Pt will perform bathing with mod mutlimodal cues and increased time for task.  Week 2:     Skilled Therapeutic Interventions/Progress Updates:    Pt lying in Bed upon OT arrival getting ready to eat breakfast.  Agreed to do therapy.  Addressed bed mobility with supine to sit (used rail); sitting balance, transfers to regular chair, sit to stand, cervical mobility,  Pt sitting EOB with cervical lateral flexion, shoulder rounding.  OT provided manual technque for muscle re education and control.   Pt had moderate pain in cervical, levator scapular area but able to tolerate.  Pt sat EOB for 40 minutes and then transferred to regular chair for better sitting surface and posture.  Mom [name]) present during session.  Mom commented on how pt's head was straighter and not leaning to side; also his posture was more aligned.  HR = 120 at beginning of session; 106 at end.   Pt stated he felt better as well. Transferred back to bed and left with all needs in reach.    Therapy Documentation Precautions:  Precautions Precautions: Fall Precaution Comments: position LUE for good positioning of left shoulder and arm  (+) subluxation, abdominal incision Required Braces or Orthoses: Sling, Other Brace/Splint Other Brace/Splint:  trach collar, sling left arm when OOB/mobilizing.   Restrictions Weight Bearing Restrictions: Yes RUE Weight Bearing: Weight bearing as tolerated LUE Weight Bearing: Weight bearing as tolerated Other Position/Activity Restrictions: PROM for L UE digits, wrist, elbow, and shoulder General:   Vital Signs: Therapy Vitals Pulse Rate: 120  At  Beginning of session; 106 at end of session Resp: 16 Patient Position (if appropriate): Lying Oxygen Therapy SpO2: 100 % O2 Device: Tracheostomy Collar O2 Flow Rate (L/min): 5 L/min FiO2 (%): 28 % Pain: Pain Assessment Pain Assessment: No/denies pain Pain Score: 0-No pain               See Function Navigator for Current Functional Status.   Therapy/Group: Individual Therapy  Humberto Seals

## 2016-12-21 ENCOUNTER — Inpatient Hospital Stay (HOSPITAL_COMMUNITY): Payer: Self-pay

## 2016-12-21 ENCOUNTER — Inpatient Hospital Stay (HOSPITAL_COMMUNITY): Payer: Self-pay | Admitting: Physical Therapy

## 2016-12-21 ENCOUNTER — Inpatient Hospital Stay (HOSPITAL_COMMUNITY): Payer: Self-pay | Admitting: Speech Pathology

## 2016-12-21 ENCOUNTER — Inpatient Hospital Stay (HOSPITAL_COMMUNITY): Payer: Self-pay | Admitting: Occupational Therapy

## 2016-12-21 DIAGNOSIS — R000 Tachycardia, unspecified: Secondary | ICD-10-CM

## 2016-12-21 MED ORDER — PANTOPRAZOLE SODIUM 40 MG PO PACK
40.0000 mg | PACK | Freq: Every day | ORAL | Status: DC
  Administered 2016-12-21 – 2016-12-30 (×10): 40 mg via ORAL
  Filled 2016-12-21 (×9): qty 20

## 2016-12-21 MED ORDER — CLONAZEPAM 0.5 MG PO TABS: 2.0000 mg | ORAL_TABLET | Freq: Three times a day (TID) | ORAL | Status: DC | PRN

## 2016-12-21 MED ORDER — PREGABALIN 50 MG PO CAPS
50.0000 mg | ORAL_CAPSULE | Freq: Three times a day (TID) | ORAL | Status: DC
  Administered 2016-12-21 – 2016-12-28 (×21): 50 mg via ORAL
  Filled 2016-12-21 (×21): qty 1

## 2016-12-21 MED ORDER — QUETIAPINE FUMARATE 50 MG PO TABS
150.0000 mg | ORAL_TABLET | Freq: Three times a day (TID) | ORAL | Status: DC
  Administered 2016-12-21 – 2016-12-23 (×7): 150 mg via ORAL
  Filled 2016-12-21 (×8): qty 1

## 2016-12-21 MED ORDER — METOCLOPRAMIDE HCL 10 MG/10ML PO SOLN
10.0000 mg | Freq: Four times a day (QID) | ORAL | Status: DC
  Administered 2016-12-21 – 2016-12-30 (×29): 10 mg via ORAL
  Filled 2016-12-21 (×39): qty 10

## 2016-12-21 MED ORDER — ACETAMINOPHEN 160 MG/5ML PO SOLN
650.0000 mg | Freq: Four times a day (QID) | ORAL | Status: DC | PRN
  Administered 2016-12-24: 650 mg via ORAL

## 2016-12-21 MED ORDER — POLYETHYLENE GLYCOL 3350 17 G PO PACK
17.0000 g | PACK | Freq: Every day | ORAL | Status: DC
  Administered 2016-12-26: 17 g via ORAL
  Filled 2016-12-21 (×7): qty 1

## 2016-12-21 MED ORDER — HYDROCODONE-ACETAMINOPHEN 7.5-325 MG/15ML PO SOLN
10.0000 mL | ORAL | Status: DC | PRN
  Administered 2016-12-26 – 2016-12-29 (×3): 10 mL via ORAL
  Filled 2016-12-21 (×4): qty 15

## 2016-12-21 NOTE — Progress Notes (Signed)
Occupational Therapy Session Note  Patient Details  Name: Chase Ayala MRN: [id number] Date of Birth: 07-Mar-1991  Today's Date: 12/21/2016 OT Individual Time: 1000-1100 OT Individual Time Calculation (min): 60 min    Short Term Goals: Week 1:  OT Short Term Goal 1 (Week 1): Pt will perform LB dressing with mod A in order to increase I with self care. OT Short Term Goal 2 (Week 1): Pt will perform toilet transfer with min A in order to decrease level of assistance with self care.  OT Short Term Goal 3 (Week 1): Pt will standing with steady assistance during LB clothing management before and after toileting.  OT Short Term Goal 4 (Week 1): Pt will perform bathing with mod mutlimodal cues and increased time for task.   Skilled Therapeutic Interventions/Progress Updates:    Upon entering the room, pt supine in bed with mother present in room. Pt on room air throughout session with O2 remaining above 98% throughout session. Pt transferred from bed >wheelchair with steady assistance. Pt bathing self from wheelchair with sit <>stand at sink. Pt standing to wash buttocks and peri area with steady assistance for balance. Pt required mod cues for sequencing and initiation this session. Caregiver requesting therapist attempt to wash patients hair in sink. Pt agreeable and allowed therapist to complete task. Pt seated in wheelchair with respiratory therapist entering and mother remaining in the room. All needs within reach.   Therapy Documentation Precautions:  Precautions Precautions: Fall Precaution Comments: position LUE for good positioning of left shoulder and arm  (+) subluxation, abdominal incision Required Braces or Orthoses: Sling, Other Brace/Splint Other Brace/Splint:  trach collar, sling left arm when OOB/mobilizing.  Restrictions Weight Bearing Restrictions: No RUE Weight Bearing: Weight bearing as tolerated LUE Weight Bearing: Weight bearing as tolerated Other  Position/Activity Restrictions: PROM for L UE digits, wrist, elbow, and shoulder General:   Vital Signs: Therapy Vitals Pulse Rate: (!) 115 Resp: 18 Patient Position (if appropriate): Sitting Oxygen Therapy SpO2: 100 % O2 Device: Tracheostomy Collar O2 Flow Rate (L/min): 5 L/min FiO2 (%): 28 % Pain: Pain Assessment Pain Assessment: No/denies pain  See Function Navigator for Current Functional Status.   Therapy/Group: Individual Therapy  Alen Bleacher 12/21/2016, 12:35 PM

## 2016-12-21 NOTE — Progress Notes (Signed)
Speech Language Pathology Daily Session Note  Patient Details  Name: Chase Ayala MRN: [id number] Date of Birth: 11/18/1990  Today's Date: 12/21/2016 SLP Individual Time: 4098-1191 SLP Individual Time Calculation (min): 34 min  Short Term Goals: Week 1: SLP Short Term Goal 1 (Week 1): Patient will maximize speech intelligibility at the sentence level with Min A verbal cues for use of a slow rate and increased vocal intensity.  SLP Short Term Goal 2 (Week 1): Patient will consume current diet with minimal overt s/s of aspiration and Min A verbal cues for use of small sips.  SLP Short Term Goal 3 (Week 1): Patient will orient to place and time with Mod A verbal and visual cues.  SLP Short Term Goal 4 (Week 1): Patient will identify 1 physical and 1 cognitive deficit with Mod A mulitmodal cues.  SLP Short Term Goal 5 (Week 1): Patient will tolerate PMSV with full supervision with all vitals remaining WFL.  SLP Short Term Goal 6 (Week 1): Patient will sustain attention to a functional task for 5 minutes with Min A verbal cues fo redirection.   Skilled Therapeutic Interventions: Skilled ST services focused on cognitive and swallowing goals. SLP administered PMSV before PO consumption and pt demonstrated no changes in vital signs and ability to tolerate during ST session.SLP facilitated recall of swallow strategies during PO consumption of meal tray with Min A verbal cues with no overt s/s aspiration and vitals remaining WFL. Pt demonstrated ability to sustain attention during PO consumption for 10 minutes with supervision A verbal cues. Pt demonstrated orientation to person, situation, place and time with supervision question cues. Pt demonstrated ability to identify 1 cognitive impairment, memory and 1 physical impairment, not being able to walk due to traumatic injury with supervision question cues.  SLP removed PMSV and pt was left in bed with mom in room. Recommend to continue ST  services.     Function:  Eating Eating   Modified Consistency Diet: Yes Eating Assist Level: Set up assist for;Supervision or verbal cues   Eating Set Up Assist For: Opening containers       Cognition Comprehension Comprehension assist level: Understands basic 75 - 89% of the time/ requires cueing 10 - 24% of the time  Expression Expression assistive device: Talk trach valve Expression assist level: Expresses basic 90% of the time/requires cueing < 10% of the time.  Social Interaction Social Interaction assist level: Interacts appropriately 75 - 89% of the time - Needs redirection for appropriate language or to initiate interaction.  Problem Solving Problem solving assist level: Solves basic 25 - 49% of the time - needs direction more than half the time to initiate, plan or complete simple activities  Memory Memory assist level: Recognizes or recalls 75 - 89% of the time/requires cueing 10 - 24% of the time    Pain Pain Assessment Pain Assessment: No/denies pain  Therapy/Group: Individual Therapy  Quinnton Bury  [facility] 12/21/2016, 9:47 AM

## 2016-12-21 NOTE — Progress Notes (Signed)
Physical Therapy Session Note  Patient Details  Name: Trayden Brandy MRN: [id number] Date of Birth: [birth year]  Today's Date: 12/21/2016 PT Individual Time: 1224-8250 PT Individual Time Calculation (min): 53 min   Short Term Goals: Week 1:  PT Short Term Goal 1 (Week 1): Pt will remain up in WC >2 hours without change in vitals  PT Short Term Goal 2 (Week 1): Pt will perform bed mobility with min assist.  PT Short Term Goal 3 (Week 1): Pt will transfer with stand pivot and min assist consistantly  PT Short Term Goal 4 (Week 1): Pt will ambulate 16f with mod assist   Skilled Therapeutic Interventions/Progress Updates:   Pt supine upon arrival and agreeable to therapy, no c/o pain. Pt did state he was tired. Monitored vitals from supine to EOB. Vitals within typical range of pt over last few days, 150s/80s BP and HR 120-140 bpm. No clinical signs or symptoms of physiological intolerance to mobility and OOB activity observed. Pt alert and oriented throughout session, reporting he feels tired but "okay" to continue. Pt remained on supplemental O2 throughout session @ 4 L/min. Ambulated 10', 30' x2, and 587 w/ Min A and w/c follow. Pt w/ multiple episodes of LOB that took Min A to correct, he was able to self-correct w/ verbal cues for gait pattern and to slow speed. Returned to room and ended session supine, call bell within reach and all needs met.   Therapy Documentation Precautions:  Precautions Precautions: Fall Precaution Comments: position LUE for good positioning of left shoulder and arm  (+) subluxation, abdominal incision Required Braces or Orthoses: Sling, Other Brace/Splint Other Brace/Splint:  trach collar, sling left arm when OOB/mobilizing.  Restrictions Weight Bearing Restrictions: No RUE Weight Bearing: Weight bearing as tolerated LUE Weight Bearing: Weight bearing as tolerated Other Position/Activity Restrictions: PROM for L UE digits, wrist, elbow, and  shoulder Vital Signs: Therapy Vitals Temp: 99.1 F (37.3 C) Temp Source: Oral Pulse Rate: (!) 116 Resp: 18 BP: (!) 153/92 Patient Position (if appropriate): Lying Oxygen Therapy SpO2: 100 % O2 Device: Tracheostomy Collar Pain: Pain Assessment Pain Assessment: No/denies pain  See Function Navigator for Current Functional Status.   Therapy/Group: Individual Therapy  Terrell Ostrand K Arnette 12/21/2016, 4:56 PM

## 2016-12-21 NOTE — Progress Notes (Signed)
Physical Therapy Note  Patient Details  Name: Chase Ayala MRN: [id number] Date of Birth: Aug 06, 1990 Today's Date: 12/21/2016  1110- 1215, 65 min individual min individual tx Pain: none  PT educated pt's mother on use of tilt feature of w/c. She was finishing washing his hair with pt in w/c, tilted back over sink for rinsing. BP 137/90, HR 111.  PT place PMV on trach. Pt reported he needed to use toilet for BM.  Toilet transfer for continent B and B.  BP after BM 138/93; HR 115.  Pt washed bil hands in sink with mod cues and mod assist.  He asked why his L arm is not working and PT educated him briefly about this.  From upright w/c, sit> stand without use of UEs for strengthening bil LEs, min assist.  Pt stood about 30 seconds during R><L wt shifting; he sat suddenly stating his stomach felt bad. BP and HR within parameters.  Pt performed long arc quad knee extensions with ankle pumps at end ranges, x 5 each.  Gait without AD on level tile from hall into room, min assist, with frequent scissoring of LEs, regaining balance spontaneously.  Pt sat on bed, then transferred to w/c.  Pt left resting in w/c with quick release belt applied and all needs within reach. PT removed PMV.   See function navigator for current status.  Lovette Merta 12/21/2016, 7:52 AM

## 2016-12-21 NOTE — Progress Notes (Signed)
Physical Therapy Note  Patient Details  Name: Chase Ayala MRN: [id number] Date of Birth: 07-10-1990 Today's Date: 12/21/2016    Time: 800-829 29 minutes  1:1 No c/o pain. Pt performed sit <> stand multiple attempts throughout session with close supervision/min guard.  Standing HR 140 bpm, pt states he feels "like I'm running".  In standing pt performed side stepping, wt shifts and eyes closed static stance all with min A. Seated LAQ and hip flex for activity tolerance. Pt requests gait training. Pt performs gait 15' x 2 with min A, mild ataxia.  HR 143 bpm after gait, quickly decreases to 119 bpm with seated rest.   Pranathi Winfree 12/21/2016, 8:29 AM

## 2016-12-21 NOTE — Progress Notes (Signed)
Speech Language Pathology Daily Session Note  Patient Details  Name: Chase Ayala MRN: [id number] Date of Birth: [birth year]  Today's Date: 12/21/2016 SLP Individual Time: 4098-1191 SLP Individual Time Calculation (min): 53 min  Short Term Goals: Week 1: SLP Short Term Goal 1 (Week 1): Patient will maximize speech intelligibility at the sentence level with Min A verbal cues for use of a slow rate and increased vocal intensity.  SLP Short Term Goal 2 (Week 1): Patient will consume current diet with minimal overt s/s of aspiration and Min A verbal cues for use of small sips.  SLP Short Term Goal 3 (Week 1): Patient will orient to place and time with Mod A verbal and visual cues.  SLP Short Term Goal 4 (Week 1): Patient will identify 1 physical and 1 cognitive deficit with Mod A mulitmodal cues.  SLP Short Term Goal 5 (Week 1): Patient will tolerate PMSV with full supervision with all vitals remaining WFL.  SLP Short Term Goal 6 (Week 1): Patient will sustain attention to a functional task for 5 minutes with Min A verbal cues fo redirection.   Skilled Therapeutic Interventions:  Pt was seen for skilled ST targeting cognitive goals.  PMSV was placed for duration of today's therapy session.  Pt's heart rate remained between 100-120 with or without the valve in place and O2 sats remained WFL.  Pt was oriented to place and date with supervision question cues.  Therapist facilitated the session with a novel card game to address attention to task.  Pt sustained his attention to task for ~15 minutes with min verbal cues for redirection.  Pt was left in bed with bed alarm set and call bell within reach.  Continue per current plan of care.    Function:  Eating Eating                 Cognition Comprehension Comprehension assist level: Understands basic 90% of the time/cues < 10% of the time  Expression Expression assistive device: Talk trach valve Expression assist level: Expresses  basic 90% of the time/requires cueing < 10% of the time.  Social Interaction Social Interaction assist level: Interacts appropriately 75 - 89% of the time - Needs redirection for appropriate language or to initiate interaction.  Problem Solving Problem solving assist level: Solves basic 50 - 74% of the time/requires cueing 25 - 49% of the time  Memory Memory assist level: Recognizes or recalls 50 - 74% of the time/requires cueing 25 - 49% of the time    Pain Pain Assessment Pain Assessment: No/denies pain  Therapy/Group: Individual Therapy  Michelene Keniston, Melanee Spry 12/21/2016, 4:26 PM
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
## 2016-12-21 NOTE — Progress Notes (Signed)
Patient's family member called the nurse to the room stating that the patient's tracheostomy tube was out. When arrived at the room, patient was laying in bed with his trach collar pulled over his head & his tracheostomy tube was laying on his right side. The trach tie was still attached on the right. Patient's oxygen saturation & pulse rate was WNL as per continuous pulse oximetry. No respiratory distress noted at the time. Patient was calm at the time. New trach tube was obtained from the bedside supply & was inserted without difficulty. Janina Mayo ties were readjusted & trach collar placed back over trach. No acute distress noted after procedure, readings from pulse ox machine still WNL Respiratory therapist was called & informed.

## 2016-12-21 NOTE — Progress Notes (Signed)
Tonight at approximately 1735, patient called to the desk & asked if someone would come & put his trach back in. Arrived at the room & the patients trach collar was on the floor, his tracheostomy tube was on the bed with one side of the trach tie still attached. The patient showed no distress at the time. Staff was asked to call respiratory therapy. His continuous pulse ox had been taken off & the probe was not applicable. Applied the dinamap pulse ox & his oxygen saturation was 98% on room air. The site did not look like there was any trauma. Patient was sitting up at semi fowlers, no labored breathing noted, respirations were WNL. Respiratory came in & replaced the tracheostomy tube. A new continuous pulse ox probe was applied. Patient was educated about the trach collar & trach. No acute distress noted. Will continue to monitor.

## 2016-12-22 ENCOUNTER — Inpatient Hospital Stay (HOSPITAL_COMMUNITY): Payer: Self-pay | Admitting: Physical Therapy

## 2016-12-22 ENCOUNTER — Inpatient Hospital Stay (HOSPITAL_COMMUNITY): Payer: Self-pay | Admitting: Occupational Therapy

## 2016-12-22 ENCOUNTER — Inpatient Hospital Stay (HOSPITAL_COMMUNITY): Payer: Self-pay | Admitting: Speech Pathology

## 2016-12-22 MED ORDER — METOPROLOL TARTRATE 25 MG/10 ML ORAL SUSPENSION
50.0000 mg | Freq: Two times a day (BID) | ORAL | Status: DC
  Administered 2016-12-22 – 2016-12-27 (×10): 50 mg via ORAL
  Filled 2016-12-22 (×10): qty 20

## 2016-12-22 MED ORDER — METOPROLOL TARTRATE 25 MG/10 ML ORAL SUSPENSION
25.0000 mg | Freq: Once | ORAL | Status: DC
  Filled 2016-12-22: qty 10

## 2016-12-22 NOTE — Progress Notes (Signed)
Physical Therapy Session Note  Patient Details  Name: Chase Ayala MRN: [id number] Date of Birth: 03-14-1991  Today's Date: 12/22/2016 PT Individual Time: 4782-9562 and 1308-6578 PT Individual Time Calculation (min): 60 min and 28 min  Short Term Goals: Week 1:  PT Short Term Goal 1 (Week 1): Pt will remain up in WC >2 hours without change in vitals  PT Short Term Goal 2 (Week 1): Pt will perform bed mobility with min assist.  PT Short Term Goal 3 (Week 1): Pt will transfer with stand pivot and min assist consistantly  PT Short Term Goal 4 (Week 1): Pt will ambulate 46ft with mod assist   Skilled Therapeutic Interventions/Progress Updates:  Treatment 1: Pt received in room with mother present but exiting up on PT arrival. Pt denied c/o pain. Pt very drowsy throughout session reporting RN administered meds (RN reported pt received seroquel prior to session). Session focused on gait training for dynamic balance, BLE strengthening, and w/c mobility for increased mobility. Pt ambulated 114 ft + 100 ft without AD & min assist; pt with lateral sway & intermittent scissoring gait. Pt utilized cybex kinetron in sitting x 3 trials of 1 minute each. Provided pt with manual w/c for increased mobility & pt able to propel gym>dayroom with BLE & supervision. Pt does require assistance for management of w/c brakes. At end of session pt left in bed with all needs within reach & alarm set.   At rest: HR = 113 bpm, SpO2 = 100% on room air, BP = 135/91 mmHg (RUE, sitting) After ambulating 110 ft: HR = 114 bpm, SpO2 = 100%, BP = 134/88 mmHg (RUE, sitting) At end of session (supine in bed): HR = 105 bpm, SpO2 = 98% room air, BP = 145/93 mmHg (RUE)    Treatment 2: Pt received in bed & agreeable to tx, noting pain in L UE but declining pain meds. Pt requesting to eat lunch and therapist provided supervision while pt consumed full liquid diet. Pt required assistance for opening containers (jello and  placing straw in juice container). Pt transfers supine>sitting EOB with supervision, bed rails, and HOB raised; pt able to maintain sitting balance on EOB with supervision. Pt transferred bed>w/c via stand pivot with close supervision<>steady assist. Pt left sitting in w/c in room with QRB donned & all needs within reach.  Sitting EOB: HR = 125 bpm, SpO2 = 99% on room air. After transferring to w/c: BP = 126/106 mmHg (RUE), HR = 112 bpm, SpO2 = 98% room air.   Therapy Documentation Precautions:  Precautions Precautions: Fall Precaution Comments: position LUE for good positioning of left shoulder and arm  (+) subluxation, abdominal incision Required Braces or Orthoses: Sling, Other Brace/Splint Other Brace/Splint:  trach collar, sling left arm when OOB/mobilizing.  Restrictions Weight Bearing Restrictions: No RUE Weight Bearing: Weight bearing as tolerated LUE Weight Bearing: Weight bearing as tolerated Other Position/Activity Restrictions: PROM for L UE digits, wrist, elbow, and shoulder   See Function Navigator for Current Functional Status.   Therapy/Group: Individual Therapy  Sandi Mariscal 12/22/2016, 2:42 PM
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
## 2016-12-22 NOTE — Progress Notes (Signed)
Physical Therapy Weekly Progress Note  Patient Details  Name: Chase Ayala MRN: [id number] Date of Birth: October 05, 1990  Beginning of progress report period: December 16, 2016 End of progress report period: December 23, 2016  Today's Date: 12/23/2016   Patient has met 3 of 4 short term goals.  Pt is making progress towards all long term goals as his BP has been more regulated on this date. Pt is able to ambulate 100 ft without AD and min assist and complete stand pivot transfers with supervision<>steady assist. Pt would benefit from continued skilled PT treatment to focus on NMR, balance, cognitive remediation, gait, and stair negotiation. Pt will also need caregiver training prior to d/c.  Patient continues to demonstrate the following deficits muscle weakness, decreased cardiorespiratory endurance, decreased coordination, decreased awareness, decreased problem solving, decreased safety awareness and decreased memory, and decreased standing balance, decreased postural control, decreased balance strategies and difficulty maintaining precautions and therefore will continue to benefit from skilled PT intervention to increase functional independence with mobility.  Patient progressing toward long term goals. Long term goals have been upgraded to supervision ambulation. Continue plan of care.  PT Short Term Goals Week 1:  PT Short Term Goal 1 (Week 1): Pt will remain up in WC >2 hours without change in vitals  PT Short Term Goal 1 - Progress (Week 1): Progressing toward goal PT Short Term Goal 2 (Week 1): Pt will perform bed mobility with min assist.  PT Short Term Goal 2 - Progress (Week 1): Met PT Short Term Goal 3 (Week 1): Pt will transfer with stand pivot and min assist consistantly  PT Short Term Goal 3 - Progress (Week 1): Met PT Short Term Goal 4 (Week 1): Pt will ambulate 109f with mod assist PT Short Term Goal 4 - Progress (Week 1): Met Week 2:  PT Short Term Goal 1 (Week 2): Pt  will ambulate 75 ft without AD & supervision. PT Short Term Goal 2 (Week 2): Pt will negotiate 4 steps with 1 rail and min assist.   Therapy Documentation Precautions:  Precautions Precautions: Fall Precaution Comments: position LUE for good positioning of left shoulder and arm  (+) subluxation, abdominal incision Required Braces or Orthoses: Sling, Other Brace/Splint Other Brace/Splint:  trach collar, sling left arm when OOB/mobilizing.  Restrictions Weight Bearing Restrictions: No RUE Weight Bearing: Weight bearing as tolerated LUE Weight Bearing: Weight bearing as tolerated Other Position/Activity Restrictions: PROM for L UE digits, wrist, elbow, and shoulder   See Function Navigator for Current Functional Status.  Therapy/Group: Individual Therapy  VWaunita Schooner10/12/2016, 8:02 AM

## 2016-12-22 NOTE — Progress Notes (Signed)
Occupational Therapy Session Note  Patient Details  Name: Chase Ayala MRN: [id number] Date of Birth: 1991-01-09  Today's Date: 12/22/2016 OT Individual Time: 0900-1000 and 1510-1610 OT Individual Time Calculation (min): 60 min and 60 min    Short Term Goals: Week 1:  OT Short Term Goal 1 (Week 1): Pt will perform LB dressing with mod A in order to increase I with self care. OT Short Term Goal 2 (Week 1): Pt will perform toilet transfer with min A in order to decrease level of assistance with self care.  OT Short Term Goal 3 (Week 1): Pt will standing with steady assistance during LB clothing management before and after toileting.  OT Short Term Goal 4 (Week 1): Pt will perform bathing with mod mutlimodal cues and increased time for task.   Skilled Therapeutic Interventions/Progress Updates:   Session 1: Upon entering the room, pt supine in bed and agreeable to OT intervention. Pt performed bed mobility with supervision and therapist assisted pt in donning L UE sling. Pt standing at sink with steady assistance and set up A for grooming tasks. Pt seated on EOB and donning B socks and shoes with assistance to tie shoelaces. Pt ambulated 100' with steady assistance taking seated rest break once returning to room. BP of 148/99 after ambulation and HR of 106. Pt remained seated in wheelchair with call bell and all needed items within reach. Mother present in room.   Session 2:Upon entering the room, pt seated in wheelchair awaiting therapist arrival. Pt standing at sink and washing hands with min A for balance. Pt seated in wheelchair and assisted outside secondary to energy conservation and time management. OT and pt discussed pt progress and pt showing good insight regarding deficits at this time. Pt declined ambulation outside. He verbalized directions back to the unit with min verbal guidance cues. Pt remembering room number as well. Pt returning to room via wheelchair and ambulation into  bathroom with steady assistance. Pt performed clothing management and hygiene with steady assistance and returned to bed after washing hands. Call bell and all needed items within reach. Mother present in room.   Therapy Documentation Precautions:  Precautions Precautions: Fall Precaution Comments: position LUE for good positioning of left shoulder and arm  (+) subluxation, abdominal incision Required Braces or Orthoses: Sling, Other Brace/Splint Other Brace/Splint:  trach collar, sling left arm when OOB/mobilizing.  Restrictions Weight Bearing Restrictions: No RUE Weight Bearing: Weight bearing as tolerated LUE Weight Bearing: Weight bearing as tolerated Other Position/Activity Restrictions: PROM for L UE digits, wrist, elbow, and shoulder General:   Vital Signs: Therapy Vitals Pulse Rate: (!) 105 Resp: 18 BP: (!) 135/91 Patient Position (if appropriate): Lying Oxygen Therapy SpO2: 100 % O2 Device: Not Delivered Pain: Pain Assessment Pain Assessment: No/denies pain ADL:   Vision   Perception    Praxis   Exercises:   Other Treatments:    See Function Navigator for Current Functional Status.   Therapy/Group: Individual Therapy  Alen Bleacher 12/22/2016, 12:20 PM

## 2016-12-22 NOTE — Progress Notes (Signed)
Speech Language Pathology Daily Session Note  Patient Details  Name: Chase Ayala MRN: [id number] Date of Birth: [birth year]  Today's Date: 12/22/2016 SLP Individual Time: 1404-1500 SLP Individual Time Calculation (min): 56 min  Short Term Goals: Week 1: SLP Short Term Goal 1 (Week 1): Patient will maximize speech intelligibility at the sentence level with Min A verbal cues for use of a slow rate and increased vocal intensity.  SLP Short Term Goal 2 (Week 1): Patient will consume current diet with minimal overt s/s of aspiration and Min A verbal cues for use of small sips.  SLP Short Term Goal 3 (Week 1): Patient will orient to place and time with Mod A verbal and visual cues.  SLP Short Term Goal 4 (Week 1): Patient will identify 1 physical and 1 cognitive deficit with Mod A mulitmodal cues.  SLP Short Term Goal 5 (Week 1): Patient will tolerate PMSV with full supervision with all vitals remaining WFL.  SLP Short Term Goal 6 (Week 1): Patient will sustain attention to a functional task for 5 minutes with Min A verbal cues fo redirection.   Skilled Therapeutic Interventions:  Pt was seen for skilled ST targeting cognitive goals.  Pt sitting up in wheelchair, awake, alert, and pleasantly interactive.  Pt was able to recall information from his daily therapy schedule with supervision question cues, including therapist's name and times of therapy.  Therapist re-administered money management tasks to measure progress from initial attempts given how much clearer pt appeared in comparison to yesterday's therapy session.  Pt was able to count money and make change for 100% accuracy with only min assist verbal cues needed to recognize and correct errors due to decreased attention to details.  Pt was able to recall function of medications when named during a basic med management task in 3 out of 5 opportunities independently.  Would recommend addressing use of pill box at next available  appointment.  Pt was returned to room and left in wheelchair with mom at bedside.  Continue per current plan of care.     Function:  Eating Eating                 Cognition Comprehension Comprehension assist level: Follows basic conversation/direction with no assist  Expression Expression assistive device: Talk trach valve Expression assist level: Expresses basic needs/ideas: With no assist  Social Interaction Social Interaction assist level: Interacts appropriately 90% of the time - Needs monitoring or encouragement for participation or interaction.  Problem Solving Problem solving assist level: Solves basic 75 - 89% of the time/requires cueing 10 - 24% of the time  Memory Memory assist level: Recognizes or recalls 50 - 74% of the time/requires cueing 25 - 49% of the time    Pain Pain Assessment Pain Assessment: No/denies pain  Therapy/Group: Individual Therapy  Michaiah Holsopple, Melanee Spry 12/22/2016, 3:05 PM

## 2016-12-22 NOTE — Plan of Care (Signed)
Problem: RH Car Transfers Goal: LTG Patient will perform car transfers with assist (PT) LTG: Patient will perform car transfers with assistance (PT).  Upgrade 2/2 progress  Problem: RH Furniture Transfers Goal: LTG Patient will perform furniture transfers w/assist (OT/PT LTG: Patient will perform furniture transfers  with assistance (OT/PT).  Upgrade 2/2 progress  Problem: RH Ambulation Goal: LTG Patient will ambulate in controlled environment (PT) LTG: Patient will ambulate in a controlled environment, # of feet with assistance (PT).  Upgrade 2/2 progress Goal: LTG Patient will ambulate in home environment (PT) LTG: Patient will ambulate in home environment, # of feet with assistance (PT).  50 ft with LRAD; upgrade 2/2 progress  Problem: RH Stairs Goal: LTG Patient will ambulate up and down stairs w/assist (PT) LTG: Patient will ambulate up and down # of stairs with assistance (PT)  5 steps with 1 rail; upgrade 2/2 progress

## 2016-12-23 ENCOUNTER — Inpatient Hospital Stay (HOSPITAL_COMMUNITY): Payer: Self-pay | Admitting: Physical Therapy

## 2016-12-23 ENCOUNTER — Inpatient Hospital Stay (HOSPITAL_COMMUNITY): Payer: Self-pay | Admitting: Occupational Therapy

## 2016-12-23 ENCOUNTER — Inpatient Hospital Stay (HOSPITAL_COMMUNITY): Payer: Self-pay | Admitting: Speech Pathology

## 2016-12-23 MED ORDER — QUETIAPINE FUMARATE 100 MG PO TABS
100.0000 mg | ORAL_TABLET | Freq: Two times a day (BID) | ORAL | Status: DC
  Administered 2016-12-23 – 2016-12-24 (×2): 100 mg via ORAL
  Filled 2016-12-23 (×2): qty 1

## 2016-12-23 NOTE — Patient Care Conference (Signed)
Inpatient RehabilitationTeam Conference and Plan of Care Update Date: 12/22/2016   Time: 2:40 PM    Patient Name: Chase Ayala      Medical Record Number: [id number]  Date of Birth: March 28, 1990 Sex: Male         Room/Bed: 4W17C/4W17C-01 Payor Info: Payor: MEDICAID POTENTIAL / Plan: MEDICAID POTENTIAL / Product Type: *No Product type* /    Admitting Diagnosis: Trauma TBI  Admit Date/Time:  12/15/2016  5:53 PM Admission Comments: No comment available   Primary Diagnosis:  <principal problem not specified> Principal Problem: <principal problem not specified>  Patient Active Problem List   Diagnosis Date Noted  . 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 Pressure injury of skin 11/30/2016  . 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 Right scapula fracture 11/09/2016  . Open skull fracture (HCC) 11/05/2016    Expected Discharge Date: Expected Discharge Date: 01/01/17  Team Members Present: Physician leading conference: Dr. Faith Rogue Social Worker Present: Amada Jupiter, LCSW Nurse Present: Other (comment) Damaris Schooner Troxler, RN) PT Present: Aleda Grana, PT OT Present: Callie Fielding, OT SLP Present: Colin Benton, SLP PPS Coordinator present : Tora Duck, RN, CRRN     Current Status/Progress Goal Weekly Team Focus  Medical   tbi with polytrauma, jaws wired, modified diet, left brachial plexus injury  improve functional mobility, reduce pain  nutrition, swallowing, hr/bp control   Bowel/Bladder   continent of bowel & bladder, LBM 12/20/16, on a clear liquid diet  remain continent  continue to monitor & assist as needed   Swallow/Nutrition/ Hydration   clear liquid diet   Supervision A  recall swallow strategies, tolerate PMSV and monitior vitals    ADL's   min A bathing, max A UB self care and mod A LB self care, min A functional transfers  min A LB and UB self care, min A bathing, and supervision overall   pt/family education, cognition, functional transfers, self care retraining, balance, safety awareness   Mobility   min assist ambulation without AD, min assist transfers  min assist<>supervision overall  balance, gait, transfers, activity tolerance, cognitive remediation, NMR   Communication   Mod- Min sentence level with PMVS  Mod I  strategies to slow rate and increase vocal intensity, monitor vitals    Safety/Cognition/ Behavioral Observations  Mod A  Min A  orientation (supervision), sustained attention, problem solving, intellectual awareness, recall    Pain   no c/o pain, has hycet & tylenol prn, has never used either, has lyrica TID  pain scale <2  continue to assess & treat as needed   Skin   has dehiscence wound to abdomen fibrous tissue noted & smaller wound to right jaw line, tracheostomy, tx-santyl & wet to moist to abd, aquacel & dry drsg to jaw  no signs of infection, no new wounds  continue to assess q shift    Rehab Goals Patient on target to meet rehab goals: Yes *See Care Plan and progress notes for long and short-term goals.     Barriers to Discharge  Current Status/Progress Possible Resolutions Date Resolved   Physician    Medical stability        medical mgt, medication adjustment      Nursing  Wound Care;Trach               PT  OT Medical stability;Trach;Wound Care                SLP                SW                Discharge Planning/Teaching Needs:  Pt to d/c home with mother in [place].  Mother and other family to provide 24/7 assistance.  Teaching to be completed    Team Discussion:  Trach capped and hope to have removed this week; some wires removed. Monitoring s/s of increased HR with activity.  Showing improved cognition.  Decreasing seroquel.  amb 100 min assist today.  Min-max (UB) ADLs due to restrictions.  Anticipate upgrade of amb goals to supervision.  Overall orientation and attention improved.  Revisions to Treatment  Plan:  None    Continued Need for Acute Rehabilitation Level of Care: The patient requires daily medical management by a physician with specialized training in physical medicine and rehabilitation for the following conditions: Daily direction of a multidisciplinary physical rehabilitation program to ensure safe treatment while eliciting the highest outcome that is of practical value to the patient.: Yes Daily medical management of patient stability for increased activity during participation in an intensive rehabilitation regime.: Yes Daily analysis of laboratory values and/or radiology reports with any subsequent need for medication adjustment of medical intervention for : Neurological problems;Pulmonary problems;Wound care problems  Kenard Morawski 12/23/2016, 9:16 AM

## 2016-12-23 NOTE — Progress Notes (Signed)
Capped trach per order

## 2016-12-23 NOTE — Progress Notes (Signed)
SLP Cancellation Note  Patient Details Name: Chase Ayala MRN: [id number] DOB: [birth year]   Cancelled treatment:        Attempted to see pt for scheduled treatment but was filling out medicaid application.  Will follow up as pt is available.                                                                                                  Shawnise Peterkin, Melanee Spry 12/23/2016, 4:30 PM

## 2016-12-23 NOTE — Progress Notes (Addendum)
Occupational Therapy Weekly Progress Note  Patient Details  Name: Chase Ayala MRN: [id number] Date of Birth: 03/22/1990  Beginning of progress report period: December 16, 2016 End of progress report period: December 23, 2016  Today's Date: 12/23/2016 OT Individual Time: [phone] and OT Individual Time Calculation (min): 75 min and   Patient has met 4 of 4 short term goals. Pt making steady progress towards goals this session. Pt performing functional transfer with steady assistance and showing increased insight to deficits and well as less impulsivity during therapy sessions. Pt compliant and agreeable to participation in therapy sessions.   Patient continues to demonstrate the following deficits: muscle weakness, decreased cardiorespiratoy endurance, decreased attention, decreased awareness, decreased problem solving, decreased safety awareness and decreased memory and decreased sitting balance, decreased standing balance and decreased balance strategies and therefore will continue to benefit from skilled OT intervention to enhance overall performance with BADL and iADL.  Patient progressing toward long term goals..  Plan of care revisions: goals upgraded for dynamic standing and sitting balance as well as LB dressing secondary to progress.  OT Short Term Goals Week 1:  OT Short Term Goal 1 (Week 1): Pt will perform LB dressing with mod A in order to increase I with self care. OT Short Term Goal 1 - Progress (Week 1): Met OT Short Term Goal 2 (Week 1): Pt will perform toilet transfer with min A in order to decrease level of assistance with self care.  OT Short Term Goal 2 - Progress (Week 1): Met OT Short Term Goal 3 (Week 1): Pt will standing with steady assistance during LB clothing management before and after toileting.  OT Short Term Goal 3 - Progress (Week 1): Met OT Short Term Goal 4 (Week 1): Pt will perform bathing with mod mutlimodal cues and increased time for task.   OT Short Term Goal 4 - Progress (Week 1): Met Week 2:  OT Short Term Goal 1 (Week 2): STGs=LTG's secondary to upcoming discharge  Skilled Therapeutic Interventions/Progress Updates:    Session 1: Upon entering the room, pt supine in bed but agreeable to OT intervention. Pt seated on EOB with BP being 124/70 and HR of 88. Pt ambulating to sink with steady assistance and performed bathing and dressing task with sit <>stand of steady assistance for balance. OT assisting pt with washing R UE and mobility of L UE for hygiene. Pt ambulating to dayroom for breakfast. Pt required supervision for meal for safety. Pt ambulating with steady assistance to day room 150' without use of AD. Pt seated in recliner chair similar to home environment with steady assistance only. Pt returning to room in same manner as above. Pt seated in wheelchair with call bell and all needed items within reach upon exiting the room.   Session 2: Pt transitioned easily from PT session with no c/o pain. Pt seated in wheelchair at table with mother present in room. Pt given the Tail Making Test  (TMT) parts A and B to determine problem solving and attention to task. Pt completed part A within 74 seconds with averga ebeing 29 seconds and pt having 2 errors. Pt was not aware of error until shown. Pt given TMT part B and he completed sample test without error. Pt completed part B within 127 seconds with normal average being 75 seconds. Pt asked to write name and date on paper. Paper labeled with correct place to write this information and pt writing on side of paper instead. Pt also  writing wrong date. Pt standing for dynavision task with initially needing steady assistance for balance and progressing to close supervision with pt crossing midline and reach in all planes. Pt hitting 85/88 targets within 2 minutes with an average of 1.33 seconds. Pt really fatiguing towards the end of time where he missed 3 targets. Pt returning to room at end of  session and remaining in wheelchair with mother present in room. Call bell and all needed items within reach upon exiting the room.   Therapy Documentation Precautions:  Precautions Precautions: Fall Precaution Comments: position LUE for good positioning of left shoulder and arm  (+) subluxation, abdominal incision Required Braces or Orthoses: Sling, Other Brace/Splint Other Brace/Splint:  trach collar, sling left arm when OOB/mobilizing.  Restrictions Weight Bearing Restrictions: No RUE Weight Bearing: Weight bearing as tolerated LUE Weight Bearing: Weight bearing as tolerated Other Position/Activity Restrictions: PROM for L UE digits, wrist, elbow, and shoulder General:   Vital Signs: Therapy Vitals Pulse Rate: (!) 112 Resp: 18 BP: (!) 156/97 Patient Position (if appropriate): Lying Oxygen Therapy SpO2: 97 % O2 Device: Not Delivered Pain: Pain Assessment Pain Assessment: No/denies pain ADL:   Vision   Perception    Praxis   Exercises:   Other Treatments:    See Function Navigator for Current Functional Status.   Therapy/Group: Individual Therapy  Gypsy Decant 12/23/2016, 12:43 PM

## 2016-12-23 NOTE — Progress Notes (Signed)
trached capped throughout shift. Oxygen 100% with no complications.

## 2016-12-23 NOTE — Plan of Care (Signed)
Problem: RH Balance Goal: LTG: Patient will maintain dynamic sitting balance (OT) LTG:  Patient will maintain dynamic sitting balance with assistance during activities of daily living (OT)  Upgraded secondary to progress Goal: LTG Patient will maintain dynamic standing with ADLs (OT) LTG:  Patient will maintain dynamic standing balance with assist during activities of daily living (OT)   Upgraded secondary to progress  Problem: RH Dressing Goal: LTG Patient will perform lower body dressing w/assist (OT) LTG: Patient will perform lower body dressing with assist, with/without cues in positioning using equipment (OT)  Upgraded secondary to progress
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
## 2016-12-23 NOTE — Progress Notes (Signed)
Physical Therapy Session Note  Patient Details  Name: Chase Ayala MRN: [id number] Date of Birth: Feb 28, 1991  Today's Date: 12/23/2016 PT Individual Time: 4098-1191 and 1330-1430 PT Individual Time Calculation (min): 60 min and 60 min   Short Term Goals: Week 2:  PT Short Term Goal 1 (Week 2): Pt will ambulate 75 ft without AD & supervision. PT Short Term Goal 2 (Week 2): Pt will negotiate 4 steps with 1 rail and min assist.  Skilled Therapeutic Interventions/Progress Updates:  Treatment 1: Pt received in bed & agreeable to tx, denying c/o pain. Pt requires supervision and extra time to transfer supine>sitting EOB with use of bed rails & HOB elevated. Pt donned tennis shoes with set up assist and extra time. Transported pt to ortho gym via w/c total assist for time management & energy conservation. Pt completed car transfer at sedan simulated height via ambulation with steady assist overall. Pt negotiates ramp, uneven surface, and curb without AD and min assist 2/2 unsteadiness. Pt engaged in pipe tree assembly from choice of many with max assist for error correction & problem solving for 2 moderately complex shapes. Pt with difficulty distinguishing and selecting correct length pipes. At end of session pt returned to room & reported need to use restroom. Pt assisted into bathroom & left in care of RN.   Spent significant time discussing pt's CLOF, goals, current deficits, inability to drive until cleared by MD, and recommendation of supervision upon d/c.   Vitals supine in bed at rest: HR = 110 bpm, SpO2 = 100% room air, BP = 156/97 mmHg (RUE)    Treatment 2: Pt received in bed denying c/o pain & agreeable to tx. Pt transferred supine>sitting EOB with hospital bed features & extra time. Pt donned regular socks & tennis shoes with set up assist and more than reasonable amount of time. In gym pt completed Berg Balance Test & scored 36/56; educated pt on interpretation of score &  current fall risk. Patient demonstrates increased fall risk as noted by score of 36/56 on Berg Balance Scale.  (<36= high risk for falls, close to 100%; 37-45 significant >80%; 46-51 moderate >50%; 52-55 lower >25%). Gait training x 200 ft without AD & supervision; pt with slight lateral sway. Discussed d/c plans, which pt reports he will d/c to his grandmother's house in [place]; pt's mother arrived at end of session & reports she and pt's grandmother will provide supervision at d/c. At end of session pt left sitting in w/c in dayroom with mother present & awaiting OT arrival.  Vitals supine in bed: BP = 151/88 mmHg (RUE), HR = 103 bpm, SpO2 = 100%    Therapy Documentation Precautions:  Precautions Precautions: Fall Precaution Comments: position LUE for good positioning of left shoulder and arm  (+) subluxation, abdominal incision Required Braces or Orthoses: Sling, Other Brace/Splint Other Brace/Splint:  trach collar, sling left arm when OOB/mobilizing.  Restrictions Weight Bearing Restrictions: No RUE Weight Bearing: Weight bearing as tolerated LUE Weight Bearing: Weight bearing as tolerated Other Position/Activity Restrictions: PROM for L UE digits, wrist, elbow, and shoulder   Balance: Balance Balance Assessed: Yes Standardized Balance Assessment Standardized Balance Assessment: Berg Balance Test Berg Balance Test Sit to Stand: Able to stand without using hands and stabilize independently Standing Unsupported: Able to stand 2 minutes with supervision Sitting with Back Unsupported but Feet Supported on Floor or Stool: Able to sit safely and securely 2 minutes Stand to Sit: Sits safely with minimal use of hands  Transfers: Able to transfer with verbal cueing and /or supervision Standing Unsupported with Eyes Closed: Able to stand 10 seconds with supervision Standing Ubsupported with Feet Together: Able to place feet together independently and stand for 1 minute with  supervision From Standing, Reach Forward with Outstretched Arm: Reaches forward but needs supervision (able to achieve 10 inches but requires supervision) From Standing Position, Pick up Object from Floor: Able to pick up shoe, needs supervision From Standing Position, Turn to Look Behind Over each Shoulder: Looks behind from both sides and weight shifts well Turn 360 Degrees: Needs close supervision or verbal cueing (completes turns to both directions but with supervision) Standing Unsupported, Alternately Place Feet on Step/Stool: Able to complete 4 steps without aid or supervision (completes 8 steps but with supervision) Standing Unsupported, One Foot in Front: Needs help to step but can hold 15 seconds (able to take small step but only able to hold 20 seconds) Standing on One Leg: Tries to lift leg/unable to hold 3 seconds but remains standing independently Total Score: 36   See Function Navigator for Current Functional Status.   Therapy/Group: Individual Therapy  Sandi Mariscal 12/23/2016, 4:29 PM

## 2016-12-23 NOTE — Consult Note (Signed)
,  WOC Nurse wound follow up Wound type: HAPI, chin evolved Stage 3 Pressure injury Measurement: 0.5cm x 2cm x 0.2cm  Wound bed:100% clean, pink Drainage (amount, consistency, odor) none Periwound: intact  Dressing procedure/placement/frequency: Apply hydrogel to the wound each day for moist wound healing, cover with dry dressing. Change daily.  WOC Nurse team will follow along with you for weekly wound assessments.  Please notify me of any acute changes in the wounds or any new areas of concerns Arielle Eber [facility] MSN, RN,CWOCN, CNS, CWON-AP [phone]

## 2016-12-24 ENCOUNTER — Inpatient Hospital Stay (HOSPITAL_COMMUNITY): Payer: Self-pay | Admitting: Occupational Therapy

## 2016-12-24 ENCOUNTER — Inpatient Hospital Stay (HOSPITAL_COMMUNITY): Payer: Self-pay | Admitting: Speech Pathology

## 2016-12-24 ENCOUNTER — Inpatient Hospital Stay (HOSPITAL_COMMUNITY): Payer: Self-pay | Admitting: Physical Therapy

## 2016-12-24 MED ORDER — PRO-STAT SUGAR FREE PO LIQD
30.0000 mL | Freq: Two times a day (BID) | ORAL | Status: DC
  Administered 2016-12-25 – 2017-01-01 (×12): 30 mL via ORAL
  Filled 2016-12-24 (×14): qty 30

## 2016-12-24 MED ORDER — BOOST / RESOURCE BREEZE PO LIQD
1.0000 | Freq: Two times a day (BID) | ORAL | Status: DC
  Administered 2016-12-25 – 2016-12-29 (×5): 1 via ORAL

## 2016-12-24 MED ORDER — ENSURE ENLIVE PO LIQD
237.0000 mL | Freq: Two times a day (BID) | ORAL | Status: DC
  Administered 2016-12-25 – 2016-12-26 (×3): 237 mL via ORAL

## 2016-12-24 MED ORDER — ONDANSETRON HCL 4 MG PO TABS
4.0000 mg | ORAL_TABLET | Freq: Three times a day (TID) | ORAL | Status: DC | PRN
  Administered 2016-12-24 – 2016-12-27 (×2): 4 mg via ORAL
  Filled 2016-12-24 (×2): qty 1

## 2016-12-24 NOTE — Progress Notes (Signed)
Patients trach was removed without any complications. Patients is doing well at this time & isn't in any distress. Vitals are as follows: HR: 98, RR: 16, BBS: Clear/Diminished. Dressing applied over stoma.

## 2016-12-24 NOTE — Progress Notes (Signed)
Speech Language Pathology Weekly Progress and Session Note  Patient Details  Name: Chase Ayala MRN: [id number] Date of Birth: 1990/11/22  Beginning of progress report period: December 16, 2016   End of progress report period: December 24, 2016   Today's Date: 12/24/2016 SLP Individual Time: 1050-1130 SLP Individual Time Calculation (min): 40 min  Short Term Goals: Week 1: SLP Short Term Goal 1 (Week 1): Patient will maximize speech intelligibility at the sentence level with Min A verbal cues for use of a slow rate and increased vocal intensity.  SLP Short Term Goal 1 - Progress (Week 1): Met SLP Short Term Goal 2 (Week 1): Patient will consume current diet with minimal overt s/s of aspiration and Min A verbal cues for use of small sips.  SLP Short Term Goal 2 - Progress (Week 1): Met SLP Short Term Goal 3 (Week 1): Patient will orient to place and time with Mod A verbal and visual cues.  SLP Short Term Goal 3 - Progress (Week 1): Met SLP Short Term Goal 4 (Week 1): Patient will identify 1 physical and 1 cognitive deficit with Mod A mulitmodal cues.  SLP Short Term Goal 4 - Progress (Week 1): Met SLP Short Term Goal 5 (Week 1): Patient will tolerate PMSV with full supervision with all vitals remaining WFL.  SLP Short Term Goal 5 - Progress (Week 1): Met SLP Short Term Goal 6 (Week 1): Patient will sustain attention to a functional task for 5 minutes with Min A verbal cues fo redirection.  SLP Short Term Goal 6 - Progress (Week 1): Met    New Short Term Goals: Week 2: SLP Short Term Goal 1 (Week 2): Pt will consume full liquids diet with supervision cues for rate and portion control and minimal overt s/s of aspiration.   SLP Short Term Goal 2 (Week 2): Pt will complete semi-complex cognitive tasks with min assist verbal cues for functional problem solving.   SLP Short Term Goal 3 (Week 2): Pt will recognize and correct errors in the moment during semi-complex tasks wtih min  verbal cues.  SLP Short Term Goal 4 (Week 2): Pt will recall semi-complex daily information with supervision question cues.   SLP Short Term Goal 5 (Week 2): Pt will selectively attend to tasks in a mildly distracting environment for ~30 minutes with min verbal cues for redirection.    Weekly Progress Updates:  Pt has made excellent functional gains this reporting period and has met 6 out of 6 short term goals.   Pt's diet has been advanced to a full liquids diet which he is consuming with full supervision for use of swallowing precautions.  He has also demonstrated marked improvement in sustained attention to tasks, orientation, problem solving, and intellectual awareness of his current physical and cognitive limitations.  Pt currently needs min-mod assist for mildly complex cognitive tasks due to mild cognitive impairments for higher levels of attention, problem solving, recall, and awareness.  As a result, pt would benefit from skilled ST while inpatient in order to maximize functional independence and reduce burden of care prior to discharge.  Pt and family education is ongoing.  Anticipate that pt will need 24/7 supervision at discharge in addition to [name] follow up at next level of care.     Intensity: Minumum of 1-2 x/day, 30 to 90 minutes Frequency: 3 to 5 out of 7 days Duration/Length of Stay: 3 weeks  Treatment/Interventions: Cognitive remediation/compensation;Cueing hierarchy;Functional tasks;Patient/family education;Therapeutic Activities;Environmental controls;Dysphagia/aspiration precaution training;Internal/external aids;Speech/Language  facilitation   Daily Session  Skilled Therapeutic Interventions: Pt was seen for skilled ST targeting cognitive goals.  SLP facilitated the session with a mildly complex medication management task.  Pt recalled changes to dosages of Seroquel with supervision question cues but needed mod-max assist to recognize and correct errors when loading pills into a  pill box that resulted from impulsivity with task as well as decreased task organization and planning.   Discussed with pt therapist's recommendations that pt's mother help him with med management at discharge.  Pt verbalized understanding and was in agreement with recommended plan of care.  Pt was returned to room and left in bed with call bell within reach and bed alarm set.  Goals updated on this date to reflect current goals and progress in therapy.      Function:   Eating Eating                 Cognition Comprehension Comprehension assist level: Follows basic conversation/direction with no assist  Expression Expression assistive device: Talk trach valve Expression assist level: Expresses basic needs/ideas: With no assist  Social Interaction Social Interaction assist level: Interacts appropriately 90% of the time - Needs monitoring or encouragement for participation or interaction.  Problem Solving Problem solving assist level: Solves basic 75 - 89% of the time/requires cueing 10 - 24% of the time  Memory Memory assist level: Recognizes or recalls 75 - 89% of the time/requires cueing 10 - 24% of the time   General    Pain Pain Assessment Pain Assessment: No/denies pain  Therapy/Group: Individual Therapy  Chase Ayala, Chase Ayala 12/24/2016, 12:44 PM

## 2016-12-24 NOTE — Progress Notes (Signed)
Physical Therapy Session Note  Patient Details  Name: Chase Ayala MRN: [id number] Date of Birth: [birth year]  Today's Date: 12/24/2016 PT Individual Time: 1305-1402 and 1350-1630 PT Individual Time Calculation (min): 57 min and 40 min   Short Term Goals: Week 2:  PT Short Term Goal 1 (Week 2): Pt will ambulate 75 ft without AD & supervision. PT Short Term Goal 2 (Week 2): Pt will negotiate 4 steps with 1 rail and min assist.  Skilled Therapeutic Interventions/Progress Updates:  Treatment 1: Pt received in bed & agreeable to tx. Pt reporting he prefers Giv Mohr sling over traditional sling for LUE but he requires assistance for positioning of arm in sling. Pt donned shoes sitting EOB with supervision and ambulated within room and bathroom with supervision. Pt with continent void standing in bathroom with supervision. Pt ambulated throughout unit without AD & supervision with improving balance. Pt performed standing cone taps with steady/min assist with task focusing on dynamic balance, NMR, and coordination. Dynamic balance and high level gait training completed by having pt ambulate backwards and side stepping with steady assist for balance and cuing for forward gaze. Gait training through obstacle course (weaving between cones, negotiating stairs with 1 rail, and stepping over poles) with close supervision. At end of session pt left in bathroom sitting on toilet. Therapist reviewed need to use call bell when finished, RN made aware & standing close by door.   Supine at beginning of session: BP = 149/70 mmHg (RUE), HR = 103 bpm, SpO2 = 100% on room air At end of session, sitting EOB: BP = 155/104 mmHg (RUE), HR = 115 bpm.   Treatment 2: Pt received in bed, reporting 6/10 pain in L hand possibly from Giv Mohr sling, but declining pain meds. Therapist assisted pt with donning traditional sling and pt donned shoes sitting EOB with set up, but pt did require assistance with tying shoes. Pt  ambulates room<>dayroom without AD & supervision. Pt utilized cybex kinetron in standing with and without UE support requiring min fade to steady assist for balance; task focused on BLE strengthening & NMR. Pt noting fatigue throughout session. Pt engaged in peg board task, requiring 1 cue to correctly assemble moderately complex design with pre-selected pieces. At end of session pt left in bed with alarm set & mother in room to supervise.   Vitals at rest sitting EOB: BP = 169/89 mmHg (RUE), HR = 110 bpm, SpO2 = 100% on room air   Therapy Documentation Precautions:  Precautions Precautions: Fall Precaution Comments: position LUE for good positioning of left shoulder and arm  (+) subluxation, abdominal incision Required Braces or Orthoses: Sling, Other Brace/Splint Other Brace/Splint:  trach collar, sling left arm when OOB/mobilizing.  Restrictions Weight Bearing Restrictions: No RUE Weight Bearing: Weight bearing as tolerated LUE Weight Bearing: Weight bearing as tolerated Other Position/Activity Restrictions: PROM for L UE digits, wrist, elbow, and shoulder    See Function Navigator for Current Functional Status.   Therapy/Group: Individual Therapy  Sandi Mariscal 12/24/2016, 4:50 PM

## 2016-12-24 NOTE — Progress Notes (Signed)
RT NOTE:  Pt decannulated today. Pt doing well @ this time. No distress. Vitals stable. Pt responding in complete sentences. RT will monitor.

## 2016-12-24 NOTE — Progress Notes (Signed)
Social Work Patient ID: Chase Ayala, male   DOB: February 04, 1991, 26 y.o.   MRN: [id number]   Met with pt and mother following team conference on Tuesday.  Aware and agreeable with targeted d/c date of 10/19 and supervision goals.  Mother notes still no decision if they will stay locally or return to [place].  Have been able to start Medicaid and SSD applications this week.  Will continue to follow.  Chase Ernsberger, LCSW

## 2016-12-24 NOTE — Progress Notes (Signed)
Occupational Therapy Session Note  Patient Details  Name: Elon Eoff MRN: [id number] Date of Birth: 01-16-1991  Today's Date: 12/24/2016 OT Individual Time: 3779-3968 OT Individual Time Calculation (min): 60 min    Short Term Goals: Week 1:  OT Short Term Goal 1 (Week 1): Pt will perform LB dressing with mod A in order to increase I with self care. OT Short Term Goal 1 - Progress (Week 1): Met OT Short Term Goal 2 (Week 1): Pt will perform toilet transfer with min A in order to decrease level of assistance with self care.  OT Short Term Goal 2 - Progress (Week 1): Met OT Short Term Goal 3 (Week 1): Pt will standing with steady assistance during LB clothing management before and after toileting.  OT Short Term Goal 3 - Progress (Week 1): Met OT Short Term Goal 4 (Week 1): Pt will perform bathing with mod mutlimodal cues and increased time for task.  OT Short Term Goal 4 - Progress (Week 1): Met Week 2:  OT Short Term Goal 1 (Week 2): STGs=LTG's secondary to upcoming discharge Week 3:     Skilled Therapeutic Interventions/Progress Updates:    1:1 Self care retraining at shower level. MD cleared pt to shower. Focus on functional ambulation, management of left UE, utilizing hemi techniques for bathing/ dressing, dynamic standing balance and activity tolerance. Trial use of Giv Mohr sling with ambulation around unit.   Therapy Documentation Precautions:  Precautions Precautions: Fall Precaution Comments: position LUE for good positioning of left shoulder and arm  (+) subluxation, abdominal incision Required Braces or Orthoses: Sling, Other Brace/Splint Other Brace/Splint:  trach collar, sling left arm when OOB/mobilizing.  Restrictions Weight Bearing Restrictions: No RUE Weight Bearing: Weight bearing as tolerated LUE Weight Bearing: Weight bearing as tolerated Other Position/Activity Restrictions: PROM for L UE digits, wrist, elbow, and shoulder  Pain: Pain  Assessment Pain Assessment: No/denies pain  See Function Navigator for Current Functional Status.   Therapy/Group: Individual Therapy  Willeen Cass [facility] 12/24/2016, 11:21 AM
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
## 2016-12-24 NOTE — Progress Notes (Signed)
Physical Therapy Note  Patient Details  Name: Chase Ayala MRN: [id number] Date of Birth: [birth year] Today's Date: 12/24/2016   Make up session 1515-1530, 15 min individual tx Pain: none   Pt stated he did not want to have a hospital bed at home, but he stated his R shoulder hurts when he rolls over on it.  With extra time and practice, mod verbal cues and tactile cues, he was able to work through (in flat bed, no rails) rolling to R in an acceptable position,  bringing knees to chest, then kicking feet off and pushing up with RUE to sitting, x 2.  By 2nd trial, pt able to do so with supervision, without cues.  Balance retraining standing at sink with RUE support, for 10 x 1 each heel/toe raises.  Without UE support, min support from PT, pt performed 10 x 1 calf raises.  Pt left resting in flat bed, with all needs within reach and mother present.   See function navigator for current status.  Analiz Tvedt 12/24/2016, 4:35 PM

## 2016-12-24 NOTE — Progress Notes (Signed)
Occupational Therapy Session Note  Patient Details  Name: Chase Ayala MRN: [id number] Date of Birth: Feb 21, 1991  Today's Date: 12/24/2016 OT Individual Time: 0705-0800 OT Individual Time Calculation (min): 55 min    Short Term Goals: Week 2:  OT Short Term Goal 1 (Week 2): STGs=LTG's secondary to upcoming discharge  Skilled Therapeutic Interventions/Progress Updates:    Upon entering the room, pt supine in bed with mother present in room. Pt with no c/o pain but reports feeling very sleepy and needing extra encouragement for participation this session. Pt seated on EOB for breakfast with min verbal cues for upright posture and set up A to open containers. Pt ambulating to bathroom to void urine while standing with overall supervision for task.  Pt ambulating without use of AD 53' and taking seated rest break secondary to fatigue. Pt verbalizing need for BM and returned back to room in same manner. Pt seated on toilet with supervision and mother present in room for safety.   Therapy Documentation Precautions:  Precautions Precautions: Fall Precaution Comments: position LUE for good positioning of left shoulder and arm  (+) subluxation, abdominal incision Required Braces or Orthoses: Sling, Other Brace/Splint Other Brace/Splint:  trach collar, sling left arm when OOB/mobilizing.  Restrictions Weight Bearing Restrictions: No RUE Weight Bearing: Weight bearing as tolerated LUE Weight Bearing: Weight bearing as tolerated Other Position/Activity Restrictions: PROM for L UE digits, wrist, elbow, and shoulder General:   Vital Signs: Therapy Vitals Temp: 98.7 F (37.1 C) Temp Source: Oral Pulse Rate: 62 Resp: 20 BP: (!) 137/91 Oxygen Therapy SpO2: 100 % O2 Device: Not Delivered  See Function Navigator for Current Functional Status.   Therapy/Group: Individual Therapy  Alen Bleacher 12/24/2016, 8:03 AM
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
## 2016-12-25 ENCOUNTER — Inpatient Hospital Stay (HOSPITAL_COMMUNITY): Payer: Self-pay | Admitting: Occupational Therapy

## 2016-12-25 ENCOUNTER — Inpatient Hospital Stay (HOSPITAL_COMMUNITY): Payer: Self-pay

## 2016-12-25 ENCOUNTER — Inpatient Hospital Stay (HOSPITAL_COMMUNITY): Payer: Self-pay | Admitting: Physical Therapy

## 2016-12-25 MED ORDER — QUETIAPINE FUMARATE 50 MG PO TABS
50.0000 mg | ORAL_TABLET | Freq: Two times a day (BID) | ORAL | Status: DC
  Administered 2016-12-25 – 2016-12-28 (×6): 50 mg via ORAL
  Filled 2016-12-25 (×6): qty 1

## 2016-12-25 MED ORDER — CLONAZEPAM 0.5 MG PO TABS: 1.0000 mg | ORAL_TABLET | Freq: Three times a day (TID) | ORAL | Status: DC | PRN

## 2016-12-25 NOTE — Progress Notes (Signed)
Speech Language Pathology Daily Session Note  Patient Details  Name: Chase Ayala MRN: [id number] Date of Birth: 02-Oct-1990  Today's Date: 12/25/2016 SLP Individual Time: 1030-1100 SLP Individual Time Calculation (min): 30 min  Short Term Goals: Week 2: SLP Short Term Goal 1 (Week 2): Pt will consume full liquids diet with supervision cues for rate and portion control and minimal overt s/s of aspiration.   SLP Short Term Goal 2 (Week 2): Pt will complete semi-complex cognitive tasks with min assist verbal cues for functional problem solving.   SLP Short Term Goal 3 (Week 2): Pt will recognize and correct errors in the moment during semi-complex tasks wtih min verbal cues.  SLP Short Term Goal 4 (Week 2): Pt will recall semi-complex daily information with supervision question cues.   SLP Short Term Goal 5 (Week 2): Pt will selectively attend to tasks in a mildly distracting environment for ~30 minutes with min verbal cues for redirection.    Skilled Therapeutic Interventions: Skilled ST services focused on cognitive goals. SLP facilitated semi-complex problem solving skills focusing organization novel information into a schedule, pt demonstrated ability to organize information and answer questions about novel information with Min A question cues, however required Mod A verbal cues to monitor and correct errors in problem solving tasks. Pt was left in bed with mother in room. Recommend to continue ST services.      Function:  Eating Eating                 Cognition Comprehension Comprehension assist level: Follows basic conversation/direction with no assist  Expression Expression assistive device: Talk trach valve Expression assist level: Expresses basic needs/ideas: With no assist  Social Interaction Social Interaction assist level: Interacts appropriately 90% of the time - Needs monitoring or encouragement for participation or interaction.  Problem Solving Problem  solving assist level: Solves basic 75 - 89% of the time/requires cueing 10 - 24% of the time  Memory Memory assist level: Recognizes or recalls 75 - 89% of the time/requires cueing 10 - 24% of the time    Pain Pain Assessment Pain Assessment: No/denies pain  Therapy/Group: Individual Therapy  Jadelyn Elks  [facility] 12/25/2016, 12:24 PM

## 2016-12-25 NOTE — Progress Notes (Signed)
Occupational Therapy Session Note  Patient Details  Name: Chase Ayala MRN: [id number] Date of Birth: [birth year]  Today's Date: 12/25/2016 OT Individual Time:700-755 and 1415-1412 55 min and 27 min  Short Term Goals: Week 2:  OT Short Term Goal 1 (Week 2): STGs=LTG's secondary to upcoming discharge  Skilled Therapeutic Interventions/Progress Updates:    1:1. No pain reported. Pt ambulates throughout session with supervision and VC for safety awareness. Pt participates in bathing at shower level with proper occlusives in place and Min A for washing R arm, back and CGA for standing balance while pt washes buttocks. Pt requires 1 VC to rinse face after washing. Pt dresses seated EOB with supervision and VC for hemi dressing techniques. Pt stands at sink to brush teeth with supervision and VC for use of LUE as stabilizer for toothbrush  As pt applies toothpaste with RUE. Exited session with pt seated in bed with call light in reach and mom present.  Session 2;  No pain reported. Pt requires question cueing to direct care to don give more sling. Pt ambulates to bathroom with supervision to transfer onto toilet. Pt able to complete clothing management/hygiene with supervision and VC for safety awareness. Pt ambulates throughout session with supervision and stands on biodex to work on balance and weight shifting for 3 min with 2.95 average reaction time to shift weight towards target. Exited sessionwith pt seated in bed with call light in reach and bed exit alarm on.  Therapy Documentation Precautions:  Precautions Precautions: Fall Precaution Comments: position LUE for good positioning of left shoulder and arm  (+) subluxation, abdominal incision Required Braces or Orthoses: Sling, Other Brace/Splint Other Brace/Splint:  trach collar, sling left arm when OOB/mobilizing.  Restrictions Weight Bearing Restrictions: No RUE Weight Bearing: Weight bearing as tolerated LUE Weight Bearing:  Weight bearing as tolerated Other Position/Activity Restrictions: PROM for L UE digits, wrist, elbow, and shoulder  See Function Navigator for Current Functional Status.   Therapy/Group: Individual Therapy  Shon Hale 12/25/2016, 7:55 AM

## 2016-12-25 NOTE — Progress Notes (Signed)
Occupational Therapy Session Note  Patient Details  Name: Chase Ayala MRN: [id number] Date of Birth: 22-Nov-1990  Today's Date: 12/25/2016 OT Individual Time: 1103-1200 6578-4696 OT Individual Time Calculation (min): 57 min and 32 min   Skilled Therapeutic Interventions/Progress Updates:    Tx focus on functional ambulation without device, balance, and cognitive remediation during functional tasks.   Pt greeted supine in bed. BP 145/90, 02 sats 100% on RA, and HR 90BPM. RN made aware and okay'd pt to go outdoors during session (his request). Pt completed stand pivot transfer to w/c with supervision and was escorted to outdoor environment, facilitated use of visual strategies in order to find his way back to CIR after. Pt ambulating outdoors with Min A due to a few LOBs on uneven surfaces/inclines. Used Give [employer] for [employer]. Pt engaging in scavenger hunt, requiring mod cues for thorough scanning of environment and also for sustained attention to task of locating 3 common household items. Pt requiring multiple seated rest breaks due to fatigue. He was then escorted back to unit, able to pathfind his way back with min vcs. He recalled 5/5 of visual markers he was asked to remember when he was first taken off of unit. Problem solved hallway fork between East/West units with questioning cues. At end of tx pt was escorted back to room and left with all needs within reach and safety belt applied.   2nd Session 1:1 (34 min) Tx focus on cognitive remediation during leisure engagement.   Pt greeted supine in bed, agreeable to tx. Pt donned shoes EOB with Min A for tying laces, and then transferred to w/c with supervision. Pt self propelled to dayroom with bilateral LEs and participated in new learning card game of Blink. Short term memory and reaction time challenges with following discussed instructions and competing with OT in game. Pt then self propelled back to room in manner as  written above. He was left with all needs within reach at time of departure and safety belt applied.   Discussed with RN regarding pt being able to self propel up/down west hallways outside of therapies (per his request). After OT-RN collaboration, informed pt that he could only self propel outside of room with staff/family present with him. He verbalized understanding.   Therapy Documentation Precautions:  Precautions Precautions: Fall Precaution Comments: position LUE for good positioning of left shoulder and arm  (+) subluxation, abdominal incision Required Braces or Orthoses: Sling, Other Brace/Splint Other Brace/Splint:  trach collar, sling left arm when OOB/mobilizing.  Restrictions Weight Bearing Restrictions: No RUE Weight Bearing: Weight bearing as tolerated LUE Weight Bearing: Weight bearing as tolerated Other Position/Activity Restrictions: PROM for L UE digits, wrist, elbow, and shoulder Vital Signs: Therapy Vitals Pulse Rate: (!) 104 Resp: 18 BP: (!) 144/85 Patient Position (if appropriate): Sitting Oxygen Therapy SpO2: 100 % O2 Device: Not Delivered Pain: Pain Assessment Pain Assessment: No/denies pain ADL:  :    See Function Navigator for Current Functional Status.  Therapy/Group: Individual Therapy  [name] A Laketra Bowdish  12/25/2016, 12:51 PM

## 2016-12-25 NOTE — Progress Notes (Signed)
Physical Therapy Session Note  Patient Details  Name: Chase Ayala MRN: [id number] Date of Birth: [birth year]  Today's Date: 12/25/2016 PT Individual Time: 0905-1005 PT Individual Time Calculation (min): 60 min   Short Term Goals: Week 2:  PT Short Term Goal 1 (Week 2): Pt will ambulate 75 ft without AD & supervision. PT Short Term Goal 2 (Week 2): Pt will negotiate 4 steps with 1 rail and min assist.  Skilled Therapeutic Interventions/Progress Updates:  Pt received in bed denying pain at rest but with some pain in R shoulder when rolling onto it to perform bed mobility. Pt transferred supine>sitting EOB without rails, bed flat, and cuing for technique but no physical assistance. Pt requested to wear Giv Mohr sling instead of traditional one and required max assist to don it. Provided pt's mother with information to purchase Giv Mohr sling if pt wishes to do so. Pt donned shoes sitting EOB with set up assist and ambulated within room<>bathroom without AD & supervision; pt with continent void standing in bathroom. Pt ambulated throughout unit and in apartment engaged in activity requiring him to purchase food items and calculate money spent/remaining with food in pantry. Pt required mod assist to correctly calculate money. Pt engaged in scavenger hunt requiring him to locate shopping cart, simulator car, and tree in dayroom; pt required mod assist to recall 1/3 items and assistance to locate car but was able to recall & locate remaining 2. Pt required seated rest breaks as needed 2/2 fatigue. Pt utilized nu-step on level 3 x 4 min + 4 min with BLE with task focusing on BLE strengthening & endurance training (HR = 104 bpm during rest break). At end of session pt left in in bed with mother present to supervise.   Vitals at rest supine in bed: BP = 144/85 mmHg (RUE), HR = 95 bpm, SpO2 = 100% on room air.    Therapy Documentation Precautions:  Precautions Precautions: Fall Precaution  Comments: position LUE for good positioning of left shoulder and arm  (+) subluxation, abdominal incision Required Braces or Orthoses: Sling, Other Brace/Splint Other Brace/Splint:  trach collar, sling left arm when OOB/mobilizing.  Restrictions Weight Bearing Restrictions: No RUE Weight Bearing: Weight bearing as tolerated LUE Weight Bearing: Weight bearing as tolerated Other Position/Activity Restrictions: PROM for L UE digits, wrist, elbow, and shoulder   See Function Navigator for Current Functional Status.   Therapy/Group: Individual Therapy  Sandi Mariscal 12/25/2016, 10:20 AM
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
## 2016-12-26 ENCOUNTER — Inpatient Hospital Stay (HOSPITAL_COMMUNITY): Payer: Self-pay | Admitting: Occupational Therapy
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
## 2016-12-26 NOTE — Significant Event (Signed)
Patient continues to refuses Lovenox injection. Patient states that he walks enough and doesn't need the injection. He has refused the injection 12/25/16 and 12/26/16.

## 2016-12-26 NOTE — Progress Notes (Signed)
Occupational Therapy Session Note  Patient Details  Name: Chase Ayala MRN: [id number] Date of Birth: 1990-10-23  Today's Date: 12/26/2016 OT Individual Time: 0930-1015 OT Individual Time Calculation (min): 45 min    Short Term Goals: Week 2:  OT Short Term Goal 1 (Week 2): STGs=LTG's secondary to upcoming discharge  Skilled Therapeutic Interventions/Progress Updates:    Upon entering the room, pt supine in bed with no c/o pain and requesting to shower. Pt obtained clothing items at supervision level this session. Pt removing shirt while seated on EOB and abdominal dressing covered. Pt ambulating to bathroom with L UE supported but therapist for comfort and pt ambulating with close supervision. Pt seated on TTB for bathing at shower level with intermittent supervision. Pt donning underwear from edge of TTB and returning to bed with supervision. RN notified for dressing change. Bed alarm activated and call bell within reach.   Therapy Documentation Precautions:  Precautions Precautions: Fall Precaution Comments: position LUE for good positioning of left shoulder and arm  (+) subluxation, abdominal incision Required Braces or Orthoses: Sling, Other Brace/Splint Other Brace/Splint:  trach collar, sling left arm when OOB/mobilizing.  Restrictions Weight Bearing Restrictions: Yes RUE Weight Bearing: Weight bearing as tolerated LUE Weight Bearing: Weight bearing as tolerated Other Position/Activity Restrictions: PROM for L UE digits, wrist, elbow, and shoulder  See Function Navigator for Current Functional Status.   Therapy/Group: Individual Therapy  Alen Bleacher 12/26/2016, 12:36 PM

## 2016-12-27 ENCOUNTER — Inpatient Hospital Stay (HOSPITAL_COMMUNITY): Payer: Self-pay

## 2016-12-27 MED ORDER — METOPROLOL TARTRATE 25 MG/10 ML ORAL SUSPENSION
75.0000 mg | Freq: Two times a day (BID) | ORAL | Status: DC
  Administered 2016-12-27 – 2016-12-30 (×6): 75 mg via ORAL
  Filled 2016-12-27 (×6): qty 30
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
## 2016-12-27 NOTE — Progress Notes (Signed)
Occupational Therapy Session Note  Patient Details  Name: Chase Ayala MRN: [id number] Date of Birth: [birth year]  Today's Date: 12/27/2016 OT Individual Time: 1345-1445 OT Individual Time Calculation (min): 60 min    Short Term Goals: Week 2:  OT Short Term Goal 1 (Week 2): STGs=LTG's secondary to upcoming discharge  Skilled Therapeutic Interventions/Progress Updates:    1:1. Focus of treatment session on cognition, attention, turn taking, and social participation in context of tabletop games with peers. Pt asleep upon arrival with increased time to arouse. Pt initially requirin encouragement to participate in tx, however interested in learning/playing new game with peer in dayroom. Pt requires VC for sequencing supine>sitting EOB transition. Pt dons socks and shoes with supervision and give-mohr sling. Pt propels w/c to dayroom with supervision. Pt teaches game of "bones" dominoes to peers explaining rules with min VC for turn taking 2/2 decreased attention. OT teaches "[place] train" dominoes to pt explaining rules. Pt requires mod fading to min VC for following rules and turn taking throughout. Pt actively "bantering" with peers and states "that was fun" at the end of the session. Exited session with pt seated in w/c with call light in reach and all needs met.   Therapy Documentation Precautions:  Precautions Precautions: Fall Precaution Comments: position LUE for good positioning of left shoulder and arm  (+) subluxation, abdominal incision Required Braces or Orthoses: Sling, Other Brace/Splint Other Brace/Splint:  trach collar, sling left arm when OOB/mobilizing.  Restrictions Weight Bearing Restrictions: No RUE Weight Bearing: Weight bearing as tolerated LUE Weight Bearing: Weight bearing as tolerated Other Position/Activity Restrictions: PROM for L UE digits, wrist, elbow, and shoulder General:    See Function Navigator for Current Functional  Status.   Therapy/Group: Individual Therapy  Tonny Branch 12/27/2016, 5:00 PM

## 2016-12-28 ENCOUNTER — Inpatient Hospital Stay (HOSPITAL_COMMUNITY): Payer: Self-pay | Admitting: Occupational Therapy

## 2016-12-28 ENCOUNTER — Inpatient Hospital Stay (HOSPITAL_COMMUNITY): Payer: Self-pay

## 2016-12-28 ENCOUNTER — Other Ambulatory Visit: Payer: Self-pay | Admitting: Otolaryngology

## 2016-12-28 MED ORDER — PREGABALIN 75 MG PO CAPS
75.0000 mg | ORAL_CAPSULE | Freq: Three times a day (TID) | ORAL | Status: DC
  Administered 2016-12-28 – 2016-12-30 (×6): 75 mg via ORAL
  Filled 2016-12-28 (×6): qty 1

## 2016-12-28 MED ORDER — CLONAZEPAM 0.5 MG PO TABS
0.5000 mg | ORAL_TABLET | Freq: Three times a day (TID) | ORAL | Status: DC | PRN
  Administered 2016-12-29: 0.5 mg via ORAL
  Filled 2016-12-28: qty 1

## 2016-12-28 MED ORDER — QUETIAPINE FUMARATE 25 MG PO TABS
25.0000 mg | ORAL_TABLET | Freq: Two times a day (BID) | ORAL | Status: DC
  Administered 2016-12-28 – 2016-12-30 (×4): 25 mg via ORAL
  Filled 2016-12-28 (×4): qty 1
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
## 2016-12-28 NOTE — Progress Notes (Signed)
Physical Therapy Session Note  Patient Details  Name: Chase Ayala MRN: [id number] Date of Birth: 28-Oct-1990  Today's Date: 12/28/2016 PT Individual Time: 1430-1515 PT Individual Time Calculation (min): 45 min  and Today's Date: 12/28/2016 PT Missed Time: 30 Minutes Missed Time Reason: Patient fatigue;Patient unwilling to participate  Short Term Goals: Week 2:  PT Short Term Goal 1 (Week 2): Pt will ambulate 75 ft without AD & supervision. PT Short Term Goal 2 (Week 2): Pt will negotiate 4 steps with 1 rail and min assist.  Skilled Therapeutic Interventions/Progress Updates:    Pt asleep upon arrival with increased time to arouse. Pt states that he is ready to go home and does not see the point in walking around in the hospital. PT provided encouragement to gain participation in tx session. Pt still denied any OOB activities. Session focused on cognitive remediation and recall of information. Pt transferred from supine to sitting up in bed with HOB elevated in order to engage in conversation with therapist. Therapist asked pt if he'd like to go outside, but pt reported it was too cold. Therapist questioned pt and had pt look up the weather with his iPad, able to state the current temperature and high/low for the day. Pt answered simple questions throughout session regarding hobbies, his family and activities he has done previously in therapy. Pt reported having a bad day, "one of those days where you don't want to do anything," therapist provided emotional support. Pt brought up all of the things he can no longer do since he can't move his L UE, therapist suggested modifications to specific activities he previously enjoyed. Pt continued to refuse any bed exercises or OOB activities this session. Pt missed 30 minutes of skilled therapy this session.   Therapy Documentation Precautions:  Precautions Precautions: Fall Precaution Comments: position LUE for good positioning of left  shoulder and arm  (+) subluxation, abdominal incision Required Braces or Orthoses: Sling, Other Brace/Splint Other Brace/Splint:  trach collar, sling left arm when OOB/mobilizing.  Restrictions Weight Bearing Restrictions: No RUE Weight Bearing: Weight bearing as tolerated LUE Weight Bearing: Weight bearing as tolerated Other Position/Activity Restrictions: PROM for L UE digits, wrist, elbow, and shoulder   See Function Navigator for Current Functional Status.   Therapy/Group: Individual Therapy  Cresenciano Genre, PT, DPT 12/28/2016, 3:15 PM

## 2016-12-28 NOTE — Progress Notes (Signed)
Occupational Therapy Session Note  Patient Details  Name: Chase Ayala MRN: [id number] Date of Birth: May 19, 1990  Today's Date: 12/28/2016 OT Individual Time: 1324-4010 and 1300-1330 OT Individual Time Calculation (min): 45 min and 30 min    Short Term Goals: Week 2:  OT Short Term Goal 1 (Week 2): STGs=LTG's secondary to upcoming discharge  Skilled Therapeutic Interventions/Progress Updates:    Session 1: Upon entering the room, pt supine in bed with mother present in room. Pt requesting to take shower and change clothing this session. Pt ambulating in room to obtain clothing items and carrying to bathroom. Pt bathing at shower level with steady assistance and min cues for safety while standing to wash buttocks. Pt continues to require assistance to thread shirt over L UE during session. Pt taking seated rest break secondary to fatigue. Abdominal wound covered for shower. Pt returned to sit on EOB at end of session with caregiver present. Call bell and all needed items within reach upon exiting the room.   Session 2: Upon entering the room, pt supine in bed with L UE in brace awaiting therapist arrival. Pt requiring encouragement for participation this session. Pt stating, " I'm just ready to go. I don't want to do this stuff anymore." OT providing therapeutic use of self and encouragement and pt agreeable to OT intervention. Pt engaged in scavenger hunt on unit to locate list of 5 items. Pt required mod multimodal cues to locate these items and return back room at end of session. Pt ambulating without needed rest break at overall close supervision for safety. Pt returning to bed with call bell and all needed items within reach. Bed alarm activated.  Therapy Documentation Precautions:  Precautions Precautions: Fall Precaution Comments: position LUE for good positioning of left shoulder and arm  (+) subluxation, abdominal incision Required Braces or Orthoses: Sling, Other  Brace/Splint Other Brace/Splint:  trach collar, sling left arm when OOB/mobilizing.  Restrictions Weight Bearing Restrictions: No RUE Weight Bearing: Weight bearing as tolerated LUE Weight Bearing: Weight bearing as tolerated Other Position/Activity Restrictions: PROM for L UE digits, wrist, elbow, and shoulder  See Function Navigator for Current Functional Status.   Therapy/Group: Individual Therapy  Alen Bleacher 12/28/2016, 9:44 AM

## 2016-12-28 NOTE — Progress Notes (Signed)
Resting easily aroused and cooperative, medicated x1 for nausea with relief verbalized, VS monitor, respiration unlabored on RA,, Abdominal wound dressing changed, tolerated well w/o complaints, Remains on Contact Precautions, call bell placed within reach

## 2016-12-28 NOTE — Progress Notes (Signed)
Resting, mother brought in potatoe soup and corn bread, patient consumed moderate amount according to mother and patient and became nauseated, Zofran administered. PO Cont to monitor, call bell within reach

## 2016-12-28 NOTE — Progress Notes (Signed)
Occupational Therapy Session Note  Patient Details  Name: Chase Ayala MRN: [id number] Date of Birth: Jun 20, 1990  Today's Date: 12/28/2016 OT Individual Time: 8295-6213 OT Individual Time Calculation (min): 28 min    Short Term Goals: Week 2:  OT Short Term Goal 1 (Week 2): STGs=LTG's secondary to upcoming discharge  Skilled Therapeutic Interventions/Progress Updates:    Treatment session with focus on recall of previous activity.  Pt received supine in bed with RN present to provide medications.  Pt donned socks and shoes with increased time without assistance.  Ambulated with Supervision to Dayroom to engage in new familiar task for recall.  Engaged in Blink matching activity with pt requiring cues approx 25% of time for sequencing and recall of 3 matching options. Returned to room supervision and left seated EOB with family present.  Therapy Documentation Precautions:  Precautions Precautions: Fall Precaution Comments: position LUE for good positioning of left shoulder and arm  (+) subluxation, abdominal incision Required Braces or Orthoses: Sling, Other Brace/Splint Other Brace/Splint:  trach collar, sling left arm when OOB/mobilizing.  Restrictions Weight Bearing Restrictions: No RUE Weight Bearing: Weight bearing as tolerated LUE Weight Bearing: Weight bearing as tolerated Other Position/Activity Restrictions: PROM for L UE digits, wrist, elbow, and shoulder Pain: Pain Assessment Pain Assessment: No/denies pain  See Function Navigator for Current Functional Status.   Therapy/Group: Individual Therapy  Rosalio Loud 12/28/2016, 10:53 AM

## 2016-12-28 NOTE — Progress Notes (Signed)
Speech Language Pathology Daily Session Note  Patient Details  Name: Chase Ayala MRN: [id number] Date of Birth: 27-Feb-1991  Today's Date: 12/28/2016 SLP Individual Time: 1100-1200 SLP Individual Time Calculation (min): 60 min  Short Term Goals: Week 2: SLP Short Term Goal 1 (Week 2): Pt will consume full liquids diet with supervision cues for rate and portion control and minimal overt s/s of aspiration.   SLP Short Term Goal 2 (Week 2): Pt will complete semi-complex cognitive tasks with min assist verbal cues for functional problem solving.   SLP Short Term Goal 3 (Week 2): Pt will recognize and correct errors in the moment during semi-complex tasks wtih min verbal cues.  SLP Short Term Goal 4 (Week 2): Pt will recall semi-complex daily information with supervision question cues.   SLP Short Term Goal 5 (Week 2): Pt will selectively attend to tasks in a mildly distracting environment for ~30 minutes with min verbal cues for redirection.    Skilled Therapeutic Interventions: Skilled ST services focused on cognitive skills. SLP facilitated semi-complex problem solving skills utilizing organization of events in scheduling task and deductive reasoning puzzle, pt required Min A verbal and question cues to complete tasks. Pt required Mod A verbal and questions cues to monitor and correct errors in semi-complex problem solving tasks. Pt demonstrated ability to recall today's therapy events in detail without visual aids at Mod I level. Pt demonstrated selective attention for 30 minutes at a time with Min A verbal cues. Pt was left in bed with call bell within reach. Recommend to continue ST services.     Function:  Eating Eating                 Cognition Comprehension Comprehension assist level: Follows basic conversation/direction with no assist  Expression   Expression assist level: Expresses basic needs/ideas: With no assist  Social Interaction Social Interaction assist  level: Interacts appropriately 90% of the time - Needs monitoring or encouragement for participation or interaction.  Problem Solving Problem solving assist level: Solves basic 75 - 89% of the time/requires cueing 10 - 24% of the time  Memory Memory assist level: Recognizes or recalls 90% of the time/requires cueing < 10% of the time    Pain Pain Assessment Pain Assessment: No/denies pain  Therapy/Group: Individual Therapy  Thersa Mohiuddin  [facility] 12/28/2016, 5:04 PM

## 2016-12-29 ENCOUNTER — Inpatient Hospital Stay (HOSPITAL_COMMUNITY): Payer: Self-pay | Admitting: Speech Pathology

## 2016-12-29 ENCOUNTER — Inpatient Hospital Stay (HOSPITAL_COMMUNITY): Payer: Self-pay | Admitting: Physical Therapy

## 2016-12-29 ENCOUNTER — Inpatient Hospital Stay (HOSPITAL_COMMUNITY): Payer: Self-pay

## 2016-12-29 ENCOUNTER — Inpatient Hospital Stay (HOSPITAL_COMMUNITY): Payer: Self-pay | Admitting: Occupational Therapy

## 2016-12-29 NOTE — Progress Notes (Signed)
Speech Language Pathology Daily Session Note  Patient Details  Name: Chase Ayala MRN: [id number] Date of Birth: 01-22-1991  Today's Date: 12/29/2016 SLP Individual Time: 7829-5621 SLP Individual Time Calculation (min): 85 min  Short Term Goals: Week 2: SLP Short Term Goal 1 (Week 2): Pt will consume full liquids diet with supervision cues for rate and portion control and minimal overt s/s of aspiration.   SLP Short Term Goal 2 (Week 2): Pt will complete semi-complex cognitive tasks with min assist verbal cues for functional problem solving.   SLP Short Term Goal 3 (Week 2): Pt will recognize and correct errors in the moment during semi-complex tasks wtih min verbal cues.  SLP Short Term Goal 4 (Week 2): Pt will recall semi-complex daily information with supervision question cues.   SLP Short Term Goal 5 (Week 2): Pt will selectively attend to tasks in a mildly distracting environment for ~30 minutes with min verbal cues for redirection.    Skilled Therapeutic Interventions: Skilled treatment session focused on dysphagia and cognitive goals. SLP facilitated session by providing skilled observation with full liquid diet. Patient consumed meal with overt cough X 1, suspect due to speaking with a full oral cavity and required supervision verbal cues for use of small bites/sips. Recommend patient continue current diet. Patient performed basic self-care tasks like dressing, etc with extra time and overall Mod I for problem solving. Patient also demonstrated increased recall of functional and daily information with increased emergent awareness of deficits but continues to demonstrate poor safety awareness. Patient left upright in bed with all needs within reach. Continue with current plan of care.      Function:  Eating Eating   Modified Consistency Diet: Yes Eating Assist Level: Set up assist for;More than reasonable amount of time   Eating Set Up Assist For: Opening containers        Cognition Comprehension Comprehension assist level: Follows basic conversation/direction with no assist  Expression   Expression assist level: Expresses basic needs/ideas: With no assist  Social Interaction Social Interaction assist level: Interacts appropriately 90% of the time - Needs monitoring or encouragement for participation or interaction.  Problem Solving Problem solving assist level: Solves basic 75 - 89% of the time/requires cueing 10 - 24% of the time  Memory Memory assist level: Recognizes or recalls 90% of the time/requires cueing < 10% of the time    Pain Pain Assessment Pain Assessment: 0-10 Pain Score: 8  Faces Pain Scale: Hurts a little bit Pain Type: Acute pain Pain Location: Shoulder Pain Orientation: Left Pain Intervention(s): Medication (See eMAR)  Therapy/Group: Individual Therapy  Chase Ayala 12/29/2016, 2:06 PM

## 2016-12-29 NOTE — Progress Notes (Signed)
Physical Therapy Session Note  Patient Details  Name: Makenzie Vittorio MRN: [id number] Date of Birth: May 25, 1990  Today's Date: 12/29/2016 PT Individual Time: 5015-8682 PT Individual Time Calculation (min): 40 min   Short Term Goals: Week 2:  PT Short Term Goal 1 (Week 2): Pt will ambulate 75 ft without AD & supervision. PT Short Term Goal 2 (Week 2): Pt will negotiate 4 steps with 1 rail and min assist.  Skilled Therapeutic Interventions/Progress Updates:   Pt supine upon arrival and hesitant to participate in therapy, however agreeable to self-propel w/c to outside. Worked on w/c mobility while self-propelling the w/c to/from outside w/ supervision. Ambulated outside >150' w/ close supervision on uneven surfaces. Ended session sitting EOB w/ bed alarm on, call bell within reach and all needs met.   Therapy Documentation Precautions:  Precautions Precautions: Fall Precaution Comments: position LUE for good positioning of left shoulder and arm  (+) subluxation, abdominal incision Required Braces or Orthoses: Sling, Other Brace/Splint Other Brace/Splint:  trach collar, sling left arm when OOB/mobilizing.  Restrictions Weight Bearing Restrictions: No RUE Weight Bearing: Weight bearing as tolerated LUE Weight Bearing: Weight bearing as tolerated Other Position/Activity Restrictions: PROM for L UE digits, wrist, elbow, and shoulder Vital Signs: Therapy Vitals Temp: 98.2 F (36.8 C) Temp Source: Oral Pulse Rate: 89 Resp: 17 BP: (!) 144/72 Patient Position (if appropriate): Lying Oxygen Therapy SpO2: 100 % O2 Device: Not Delivered  See Function Navigator for Current Functional Status.   Therapy/Group: Individual Therapy  Love Chowning K Arnette 12/29/2016, 4:58 PM

## 2016-12-29 NOTE — Progress Notes (Signed)
Physical Therapy Session Note  Patient Details  Name: Chase Ayala MRN: [id number] Date of Birth: 05-Oct-1990  Today's Date: 12/29/2016 PT Individual Time: 0800-0902 PT Individual Time Calculation (min): 62 min   Short Term Goals: Week 2:  PT Short Term Goal 1 (Week 2): Pt will ambulate 75 ft without AD & supervision. PT Short Term Goal 2 (Week 2): Pt will negotiate 4 steps with 1 rail and min assist.  Skilled Therapeutic Interventions/Progress Updates:    Pt supine in bed upon PT arrival, agreeable to therapy tx and denies pain. When given 4 options pt chose to participate in chess and connect 4 this session. Pt asked to use the bathroom prior to heading to the gym. Therapist applied sling total assist. Pt transferred supine to sitting EOB with supervision and ambulated from bed<>bathroom with supervision. Pt performed all toileting with supervision. Pt ambulated within the unit >300 ft in order to find/retrieve board games, pt carried games to the dayroom. Pt played connect 4 while explaining rules and concept of the game to therapist, won 3/3 times. Pt engaged in playing chess while explaining rules, purpose of each piece and concept of the game to the therapist. Pt left in room at end of session seated EOB with mother present.   Therapy Documentation Precautions:  Precautions Precautions: Fall Precaution Comments: position LUE for good positioning of left shoulder and arm  (+) subluxation, abdominal incision Required Braces or Orthoses: Sling, Other Brace/Splint Other Brace/Splint:  trach collar, sling left arm when OOB/mobilizing.  Restrictions Weight Bearing Restrictions: No RUE Weight Bearing: Weight bearing as tolerated LUE Weight Bearing: Weight bearing as tolerated Other Position/Activity Restrictions: PROM for L UE digits, wrist, elbow, and shoulder   See Function Navigator for Current Functional Status.   Therapy/Group: Individual Therapy  Cresenciano Genre,  PT, DPT 12/29/2016, 8:12 AM

## 2016-12-29 NOTE — Progress Notes (Signed)
Occupational Therapy Session Note  Patient Details  Name: Chase Ayala MRN: [id number] Date of Birth: 1990-09-10  Today's Date: 12/29/2016 OT Individual Time: 1000-1045 OT Individual Time Calculation (min): 45 min    Short Term Goals: Week 2:  OT Short Term Goal 1 (Week 2): STGs=LTG's secondary to upcoming discharge  Skilled Therapeutic Interventions/Progress Updates:    1;1. Pt reporting nerve pain in LUE but not rating and requesting medication. RN notified. Pt requesting to complete bathing and dressing at shower level today. Pt taking off sling prior to walking into shower. OT educates on importance of wearing sling/supporting shoulder to decrease subluxation; however pt declines and says, "why do yall have to be so controlling." OT applies shower shield to abdominal dressing. Pt ambulates without sling and without supporting elbow with supervision to transfer into shower. Pt bathes at sit to stand level with  supervision and VC to allow LUE to rest on grab bar for support when standing to wash buttocks. Pt takes extended time to terminate shower Pt dons LB clothing seated EOB with supervision at sit to stand level. Pt declines donning shirt. Exited session with pt seated in bed with call light in reach and bed exit alarm on.  Therapy Documentation Precautions:  Precautions Precautions: Fall Precaution Comments: position LUE for good positioning of left shoulder and arm  (+) subluxation, abdominal incision Required Braces or Orthoses: Sling, Other Brace/Splint Other Brace/Splint:  trach collar, sling left arm when OOB/mobilizing.  Restrictions Weight Bearing Restrictions: No RUE Weight Bearing: Weight bearing as tolerated LUE Weight Bearing: Weight bearing as tolerated Other Position/Activity Restrictions: PROM for L UE digits, wrist, elbow, and shoulder General:    See Function Navigator for Current Functional Status.   Therapy/Group: Individual Therapy  Shon Hale 12/29/2016, 10:17 AM
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
## 2016-12-30 ENCOUNTER — Inpatient Hospital Stay (HOSPITAL_COMMUNITY): Payer: Self-pay | Admitting: Speech Pathology

## 2016-12-30 ENCOUNTER — Inpatient Hospital Stay (HOSPITAL_COMMUNITY): Payer: Self-pay

## 2016-12-30 ENCOUNTER — Inpatient Hospital Stay (HOSPITAL_COMMUNITY): Payer: Self-pay | Admitting: Occupational Therapy

## 2016-12-30 ENCOUNTER — Inpatient Hospital Stay (HOSPITAL_COMMUNITY): Payer: Self-pay | Admitting: Physical Therapy

## 2016-12-30 MED ORDER — PANTOPRAZOLE SODIUM 40 MG PO TBEC
40.0000 mg | DELAYED_RELEASE_TABLET | Freq: Every day | ORAL | Status: DC
  Administered 2016-12-31 – 2017-01-01 (×2): 40 mg via ORAL
  Filled 2016-12-30 (×2): qty 1

## 2016-12-30 MED ORDER — METOCLOPRAMIDE HCL 5 MG PO TABS
5.0000 mg | ORAL_TABLET | Freq: Three times a day (TID) | ORAL | Status: DC
  Administered 2016-12-30 – 2017-01-01 (×3): 5 mg via ORAL
  Filled 2016-12-30 (×4): qty 1

## 2016-12-30 MED ORDER — HYDROCODONE-ACETAMINOPHEN 7.5-325 MG PO TABS: 1.0000 | ORAL_TABLET | Freq: Four times a day (QID) | ORAL | Status: DC | PRN

## 2016-12-30 MED ORDER — METOPROLOL TARTRATE 50 MG PO TABS
75.0000 mg | ORAL_TABLET | Freq: Two times a day (BID) | ORAL | Status: DC
  Administered 2016-12-30 – 2017-01-01 (×4): 75 mg via ORAL
  Filled 2016-12-30 (×4): qty 1

## 2016-12-30 MED ORDER — PREGABALIN 50 MG PO CAPS
100.0000 mg | ORAL_CAPSULE | Freq: Three times a day (TID) | ORAL | Status: DC
  Administered 2016-12-30 – 2017-01-01 (×4): 100 mg via ORAL
  Filled 2016-12-30 (×4): qty 2

## 2016-12-30 NOTE — Progress Notes (Signed)
Speech Language Pathology Daily Session Notes  Patient Details  Name: Chase Ayala MRN: [id number] Date of Birth: Aug 23, 1990  Today's Date: 12/30/2016  Session 1: SLP Individual Time: 1030-1050 SLP Individual Time Calculation (min): 20 min   Session 2: SLP Individual Time: 1255-1330 SLP Individual Time Calculation (min): 35 min  Short Term Goals: Week 2: SLP Short Term Goal 1 (Week 2): Pt will consume full liquids diet with supervision cues for rate and portion control and minimal overt s/s of aspiration.   SLP Short Term Goal 2 (Week 2): Pt will complete semi-complex cognitive tasks with min assist verbal cues for functional problem solving.   SLP Short Term Goal 3 (Week 2): Pt will recognize and correct errors in the moment during semi-complex tasks wtih min verbal cues.  SLP Short Term Goal 4 (Week 2): Pt will recall semi-complex daily information with supervision question cues.   SLP Short Term Goal 5 (Week 2): Pt will selectively attend to tasks in a mildly distracting environment for ~30 minutes with min verbal cues for redirection.    Skilled Therapeutic Interventions:  Session 1: Skilled treatment session focused on cognitive goals. Upon arrival, patient appeared lethargic. SLP facilitated session by providing Max A verbal cues for arousal 2/2 to patient falling asleep while writing. Patient reported he felt lethargy was due to medications, RN and PA aware.  Patient unable to maintain arousal in order to participate in therapy, therefore, patient ambulated back to his room and left supine in bed with alarm on and all needs within reach. Patient missed remaining 10 minutes of session. Continue with current plan of care.   Session 2: Skilled treatment session focused on cognitive goals. SLP facilitated session by administering the MoCA (version 7.3). Patient scored 26/30 points with a score of 26 or above considered normal. Patient demonstrates deficits in recall and  attention. Patient left supine in bed with all needs within reach. Continue with current plan of care.   Function:   Cognition Comprehension Comprehension assist level: Follows basic conversation/direction with no assist  Expression   Expression assist level: Expresses basic needs/ideas: With no assist  Social Interaction Social Interaction assist level: Interacts appropriately 90% of the time - Needs monitoring or encouragement for participation or interaction.  Problem Solving Problem solving assist level: Solves basic 75 - 89% of the time/requires cueing 10 - 24% of the time  Memory Memory assist level: Recognizes or recalls 90% of the time/requires cueing < 10% of the time    Pain No/Denies Pain   Therapy/Group: Individual Therapy  Chase Ayala 12/30/2016, 3:45 PM

## 2016-12-30 NOTE — Patient Care Conference (Signed)
Inpatient RehabilitationTeam Conference and Plan of Care Update Date: 12/29/2016   Time: 2:40 PM    Patient Name: Chase Ayala      Medical Record Number: [id number]  Date of Birth: 04/29/1990 Sex: Male         Room/Bed: 4W17C/4W17C-01 Payor Info: Payor: MEDICAID PENDING / Plan: MEDICAID PENDING / Product Type: *No Product type* /    Admitting Diagnosis: Trauma TBI  Admit Date/Time:  12/15/2016  5:53 PM Admission Comments: No comment available   Primary Diagnosis:  <principal problem not specified> Principal Problem: <principal problem not specified>  Patient Active Problem List   Diagnosis Date Noted  . 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 Pressure injury of skin 11/30/2016  . 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 Right scapula fracture 11/09/2016  . Open skull fracture (HCC) 11/05/2016    Expected Discharge Date: Expected Discharge Date: 01/01/17  Team Members Present: Physician leading conference: Dr. Faith Rogue Social Worker Present: Amada Jupiter, LCSW Nurse Present: Other (comment) Adora Fridge, RN) PT Present: Karolee Stamps, PT OT Present: Roney Mans, OT SLP Present: Feliberto Gottron, SLP PPS Coordinator present : Tora Duck, RN, CRRN     Current Status/Progress Goal Weekly Team Focus  Medical   improving cognition. LUE with ongoing pain. lower extremity, lower jaw hardwares still in place  pain control  ongoing pain mgt,  ent follow up for hardware removal   Bowel/Bladder   Continent of bowel and bladder. Last BM 10/13.  Remain continent of both.  Monitor for changes in bowel and bladder function.   Swallow/Nutrition/ Hydration   Full liquid diet  Supervision A  Use of swallowing strategies    ADL's   min A bathing, UB self care, and LB dressing, min A functional transfers without use of AD  min A LB and UB self care, min A bathing, and supervision overall  pt/family edu, d/c planning,  functional transfers, self care, endurance, balance. safety   Mobility   supervision for bed mobility, transfers and ambulation without AD  supervision overall  high level balance, d/c planning, cognitive remediation, activity tolerance, NMR   Communication   Supervision  Mod I  increased vocal intensity    Safety/Cognition/ Behavioral Observations  Min A  Min A  Family education   Pain   Denies pain this shift. On prn hycet and tylenol.   Pain less than or equal to 2.  Assess for pain q shift and prn.   Skin   Wound to abdomen with daily dressing wet to dry. dressing CDI. Chin wound CDI with hydrogel and dry dressing order.  No new skin breakdown while in rehab.  Monitor skin q shift and prn.    Rehab Goals Patient on target to meet rehab goals: Yes *See Care Plan and progress notes for long and short-term goals.     Barriers to Discharge  Current Status/Progress Possible Resolutions Date Resolved   Physician    Medical stability;Medication compliance        medical mgt, supervision      Nursing                  PT                    OT                  SLP  SW                Discharge Planning/Teaching Needs:  Pt to d/c home with mother in [place].  Mother and other family to provide 24/7 assistance.  Teaching to be completed    Team Discussion:  Await ENT to remove hardware and hope to be able to upgrade diet at that point.  Showing some distal movement in hand.  Weaning meds;  Appears to be making good gains cogntively.  Ready for d/c end of week.  Revisions to Treatment Plan:  None    Continued Need for Acute Rehabilitation Level of Care: The patient requires daily medical management by a physician with specialized training in physical medicine and rehabilitation for the following conditions: Daily direction of a multidisciplinary physical rehabilitation program to ensure safe treatment while eliciting the highest outcome that is of practical value  to the patient.: Yes Daily medical management of patient stability for increased activity during participation in an intensive rehabilitation regime.: Yes Daily analysis of laboratory values and/or radiology reports with any subsequent need for medication adjustment of medical intervention for : Neurological problems;Diabetes problems  Athalee Esterline 12/30/2016, 1:57 PM

## 2016-12-30 NOTE — Consult Note (Signed)
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 Thanks  Tramaine Snell [employer] MSN, RN,CWOCN, CNS, CWON-AP [phone]):

## 2016-12-30 NOTE — Progress Notes (Signed)
Occupational Therapy Session Note  Patient Details  Name: Chase Ayala MRN: [id number] Date of Birth: 1990-08-08  Today's Date: 12/30/2016 OT Individual Time: 0950-1030 OT Individual Time Calculation (min): 40 min    Short Term Goals: Week 2:  OT Short Term Goal 1 (Week 2): STGs=LTG's secondary to upcoming discharge  Skilled Therapeutic Interventions/Progress Updates:    1:1 Continued focus on dynamic balance and balance reactions using the Bidex with unstable surface (on level 9) with and without UE Support. Performed limits of stability and "catch" pt able to complete tasks but with increased amt of time to compensate for moveable surface.  Performed ~10 min on Nustep on resistance level 5 with goal of remaining at a BORG level 13.   Participated in cognitive activity with min A in maximal distracting environment at table top.   Therapy Documentation Precautions:  Precautions Precautions: Fall Precaution Comments: position LUE for good positioning of left shoulder and arm  (+) subluxation, abdominal incision Required Braces or Orthoses: Sling, Other Brace/Splint Other Brace/Splint:  trach collar, sling left arm when OOB/mobilizing.  Restrictions Weight Bearing Restrictions: No RUE Weight Bearing: Weight bearing as tolerated LUE Weight Bearing: Weight bearing as tolerated Other Position/Activity Restrictions: PROM for L UE digits, wrist, elbow, and shoulder Pain:  no reports of pain  See Function Navigator for Current Functional Status.   Therapy/Group: Individual Therapy  [name], Rolonda Pontarelli [facility] 12/30/2016, 11:37 AM

## 2016-12-30 NOTE — Progress Notes (Signed)
Physical Therapy Session Note  Patient Details  Name: Chase Ayala MRN: [id number] Date of Birth: [birth year]  Today's Date: 12/30/2016 PT Individual Time: 1330-1410 PT Individual Time Calculation (min): 40 min   Short Term Goals: Week 2:  PT Short Term Goal 1 (Week 2): Pt will ambulate 75 ft without AD & supervision. PT Short Term Goal 2 (Week 2): Pt will negotiate 4 steps with 1 rail and min assist.  Skilled Therapeutic Interventions/Progress Updates: Pt presented in bed indicating some fatigue and anxious for removal of hardware (scheduled today). Pt agreeable to participate in cognitive remediation by playing connect 4. Pt was able to instruct pt rules of game and was able to provide some strategies for technique. Pt able to maintain meaningful conservation while playing game. Pt and PTA discussed adaptations and awareness of energy conservation levels upon d/c as well. . Pt agreeable to perform ambulation throughout unit and ambulated >370f on level tile and carpeted areas at overall supervision level. Pt indicating wished to return to room and await transport for procedure. Pt returned to room and returned to bed supervision level with needs met.      Therapy Documentation Precautions:  Precautions Precautions: Fall Precaution Comments: position LUE for good positioning of left shoulder and arm  (+) subluxation, abdominal incision Required Braces or Orthoses: Sling, Other Brace/Splint Other Brace/Splint:  trach collar, sling left arm when OOB/mobilizing.  Restrictions Weight Bearing Restrictions: No RUE Weight Bearing: Weight bearing as tolerated LUE Weight Bearing: Weight bearing as tolerated Other Position/Activity Restrictions: PROM for L UE digits, wrist, elbow, and shoulder General: PT Amount of Missed Time (min): 20 Minutes PT Missed Treatment Reason: Patient fatigue;Patient unwilling to participate   See Function Navigator for Current Functional  Status.   Therapy/Group: Individual Therapy  Jemel Ono  Deaisha Welborn, PTA  12/30/2016, 2:21 PM

## 2016-12-30 NOTE — Progress Notes (Signed)
Occupational Therapy Session Note  Patient Details  Name: Chase Ayala MRN: [id number] Date of Birth: 1990-08-22  Today's Date: 12/30/2016 OT Individual Time: 0800-0900 OT Individual Time Calculation (min): 60 min    Short Term Goals: Week 2:  OT Short Term Goal 1 (Week 2): STGs=LTG's secondary to upcoming discharge  Skilled Therapeutic Interventions/Progress Updates:    Upon entering the room, pt supine in bed with mother present in room. Pt with c/o L UE "nerve pain" and pt reports notifying RN before therapist arrival. Pt agreeable to OT intervention. Pt declined bathing and dressing this session. Supine >sit with supervision to EOB. OT assisted pt with donning L UE shoulder sling. Pt requesting to go outside but refused ambulation. Pt propelled wheelchair with B LE's to work on strengthening and endurance to main entrance. Pt having to path find way back to unit from outside while ambulating with supervision for balance and min verbal questioning cues to locate correct floor of hospital. Pt returning to room and seated on EOB with call bell and all needed items within reach upon exiting the room.   Therapy Documentation Precautions:  Precautions Precautions: Fall Precaution Comments: position LUE for good positioning of left shoulder and arm  (+) subluxation, abdominal incision Required Braces or Orthoses: Sling, Other Brace/Splint Other Brace/Splint:  trach collar, sling left arm when OOB/mobilizing.  Restrictions Weight Bearing Restrictions: No RUE Weight Bearing: Weight bearing as tolerated LUE Weight Bearing: Weight bearing as tolerated Other Position/Activity Restrictions: PROM for L UE digits, wrist, elbow, and shoulder     See Function Navigator for Current Functional Status.   Therapy/Group: Individual Therapy  Alen BleacherBradsher, Liana Camerer P 12/30/2016, 9:14 AM

## 2016-12-30 NOTE — Progress Notes (Signed)
Physical Therapy Note  Patient Details  Name: Mart PiggsCamry O'Brian Sublett MRN: [id number] Date of Birth: [birth year] Today's Date: 12/30/2016  [id number], 45 min individual tx Pain: none per pt, pemedicated  PT shortened strap of L arm sling to decrease subluxation, and educated pt and mother about supporting joint.Gait training on level tile with head turns and request for increased speed.  Pt did not increase speed, but did not slow down with head turns. Agility floor ladder R<<>L , forward, backward, with fair success keeping feet from touching orange slats. Floor transfer with mod assist to decrease wt bearing on R shoulder which pt said was painful.  Vertical nystagmus noted as pt lay down flat; it resolved in less than 30 seconds. Pt was surprised at how difficult the floor transfer was; discussed safety and fall recovery and PRN 911.  Sustained stretch for neuro re-ed,  bil heel cords x 5 minu standing on wedge, folding towels with R hand, improving in technique with practice.  With external perturbations, pt demonstrated bil ankle and hip strategies. Gait training to return to room kicking Yoga block with alternating feet, with 50% success alternating feet; he needed mod cues to place his body or aim block so that alternating feet was comfortable.  Pt left resting in bed with all needs within reach.  See function navigator for current status.  Lynsay Fesperman 12/30/2016, 7:54 AM
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
## 2016-12-31 ENCOUNTER — Inpatient Hospital Stay: Admit: 2016-12-31 | Payer: MEDICAID | Admitting: Otolaryngology

## 2016-12-31 ENCOUNTER — Encounter (HOSPITAL_COMMUNITY): Payer: Self-pay | Admitting: Certified Registered Nurse Anesthetist

## 2016-12-31 ENCOUNTER — Inpatient Hospital Stay (HOSPITAL_COMMUNITY): Payer: Self-pay | Admitting: Certified Registered"

## 2016-12-31 ENCOUNTER — Inpatient Hospital Stay (HOSPITAL_COMMUNITY): Payer: Self-pay

## 2016-12-31 ENCOUNTER — Inpatient Hospital Stay (HOSPITAL_COMMUNITY): Payer: Self-pay | Admitting: Occupational Therapy

## 2016-12-31 ENCOUNTER — Inpatient Hospital Stay (HOSPITAL_COMMUNITY): Payer: Self-pay | Admitting: Speech Pathology

## 2016-12-31 ENCOUNTER — Encounter (HOSPITAL_COMMUNITY)
Admission: RE | Disposition: A | Discharge status: Home Health | Payer: Self-pay | Source: Intra-hospital | Attending: Physical Medicine & Rehabilitation

## 2016-12-31 HISTORY — PX: MANDIBULAR HARDWARE REMOVAL: SHX5205

## 2016-12-31 SURGERY — REMOVAL, HARDWARE, MANDIBLE
Anesthesia: Monitor Anesthesia Care

## 2016-12-31 MED ORDER — PROPOFOL 10 MG/ML IV BOLUS
INTRAVENOUS | Status: DC | PRN
  Administered 2016-12-31: 40 mg via INTRAVENOUS

## 2016-12-31 MED ORDER — LACTATED RINGERS IV SOLN
INTRAVENOUS | Status: DC
  Administered 2016-12-31: 15:00:00 via INTRAVENOUS

## 2016-12-31 MED ORDER — ROCURONIUM BROMIDE 50 MG/5ML IV SOLN
INTRAVENOUS | Status: AC
  Filled 2016-12-31: qty 1

## 2016-12-31 MED ORDER — LIDOCAINE-EPINEPHRINE 1 %-1:100000 IJ SOLN
INTRAMUSCULAR | Status: AC
  Filled 2016-12-31: qty 1

## 2016-12-31 MED ORDER — MIDAZOLAM HCL 5 MG/5ML IJ SOLN
INTRAMUSCULAR | Status: DC | PRN
  Administered 2016-12-31: 2 mg via INTRAVENOUS

## 2016-12-31 MED ORDER — LIDOCAINE 2% (20 MG/ML) 5 ML SYRINGE
INTRAMUSCULAR | Status: AC
  Filled 2016-12-31: qty 5

## 2016-12-31 MED ORDER — FENTANYL CITRATE (PF) 250 MCG/5ML IJ SOLN
INTRAMUSCULAR | Status: AC
  Filled 2016-12-31: qty 5

## 2016-12-31 MED ORDER — PROPOFOL 10 MG/ML IV BOLUS
INTRAVENOUS | Status: AC
  Filled 2016-12-31: qty 20

## 2016-12-31 MED ORDER — PROPOFOL 500 MG/50ML IV EMUL
INTRAVENOUS | Status: DC | PRN
  Administered 2016-12-31: 150 ug/kg/min via INTRAVENOUS

## 2016-12-31 MED ORDER — MIDAZOLAM HCL 2 MG/2ML IJ SOLN
INTRAMUSCULAR | Status: AC
  Filled 2016-12-31: qty 2

## 2016-12-31 MED ORDER — FENTANYL CITRATE (PF) 100 MCG/2ML IJ SOLN
INTRAMUSCULAR | Status: DC | PRN
  Administered 2016-12-31 (×3): 50 ug via INTRAVENOUS

## 2016-12-31 MED ORDER — ONDANSETRON HCL 4 MG/2ML IJ SOLN
INTRAMUSCULAR | Status: DC | PRN
  Administered 2016-12-31: 4 mg via INTRAVENOUS

## 2016-12-31 MED ORDER — 0.9 % SODIUM CHLORIDE (POUR BTL) OPTIME
TOPICAL | Status: DC | PRN
  Administered 2016-12-31: 1000 mL

## 2016-12-31 SURGICAL SUPPLY — 26 items
BLADE SURG 15 STRL LF DISP TIS (BLADE) IMPLANT
BLADE SURG 15 STRL SS (BLADE)
CANISTER SUCT 3000ML PPV (MISCELLANEOUS) ×3 IMPLANT
COVER SURGICAL LIGHT HANDLE (MISCELLANEOUS) ×3 IMPLANT
DRAPE HALF SHEET 40X57 (DRAPES) ×3 IMPLANT
GAUZE SPONGE 4X4 12PLY STRL LF (GAUZE/BANDAGES/DRESSINGS) ×3 IMPLANT
GAUZE SPONGE 4X4 16PLY XRAY LF (GAUZE/BANDAGES/DRESSINGS) IMPLANT
GLOVE ECLIPSE 7.5 STRL STRAW (GLOVE) ×3 IMPLANT
GOWN STRL REUS W/ TWL LRG LVL3 (GOWN DISPOSABLE) ×2 IMPLANT
GOWN STRL REUS W/TWL LRG LVL3 (GOWN DISPOSABLE) ×4
KIT BASIN OR (CUSTOM PROCEDURE TRAY) ×3 IMPLANT
KIT ROOM TURNOVER OR (KITS) ×3 IMPLANT
NEEDLE HYPO 25GX1X1/2 BEV (NEEDLE) IMPLANT
NEEDLE PRECISIONGLIDE 27X1.5 (NEEDLE) IMPLANT
NS IRRIG 1000ML POUR BTL (IV SOLUTION) ×3 IMPLANT
PAD ARMBOARD 7.5X6 YLW CONV (MISCELLANEOUS) ×6 IMPLANT
SUCTION FRAZIER HANDLE 10FR (MISCELLANEOUS)
SUCTION TUBE FRAZIER 10FR DISP (MISCELLANEOUS) IMPLANT
SUT CHROMIC 3 0 PS 2 (SUTURE) ×3 IMPLANT
SUT CHROMIC 4 0 PS 2 18 (SUTURE) IMPLANT
SYR CONTROL 10ML LL (SYRINGE) ×3 IMPLANT
TOWEL OR 17X24 6PK STRL BLUE (TOWEL DISPOSABLE) ×3 IMPLANT
TRAY ENT MC OR (CUSTOM PROCEDURE TRAY) ×3 IMPLANT
TUBE CONNECTING 12'X1/4 (SUCTIONS) ×1
TUBE CONNECTING 12X1/4 (SUCTIONS) ×2 IMPLANT
YANKAUER SUCT BULB TIP NO VENT (SUCTIONS) ×3 IMPLANT

## 2016-12-31 NOTE — Anesthesia Preprocedure Evaluation (Signed)
Anesthesia Evaluation  Patient identified by MRN, date of birth, ID band Patient awake    Reviewed: Allergy & Precautions, NPO status , Patient's Chart, lab work & pertinent test results  Airway Mallampati: II  TM Distance: >3 FB Neck ROM: Full   Comment: S/P trach Dental no notable dental hx.    Pulmonary Current Smoker,    Pulmonary exam normal breath sounds clear to auscultation       Cardiovascular negative cardio ROS Normal cardiovascular exam Rhythm:Regular Rate:Normal     Neuro/Psych TBI negative psych ROS   GI/Hepatic negative GI ROS, Neg liver ROS,   Endo/Other  negative endocrine ROS  Renal/GU negative Renal ROS  negative genitourinary   Musculoskeletal negative musculoskeletal ROS (+)   Abdominal   Peds negative pediatric ROS (+)  Hematology negative hematology ROS (+)   Anesthesia Other Findings   Reproductive/Obstetrics negative OB ROS                             Anesthesia Physical Anesthesia Plan  ASA: II  Anesthesia Plan: General   Post-op Pain Management:    Induction: Intravenous  PONV Risk Score and Plan: 1 and Ondansetron, Dexamethasone and Treatment may vary due to age or medical condition  Airway Management Planned: Oral ETT and Video Laryngoscope Planned  Additional Equipment:   Intra-op Plan:   Post-operative Plan: Extubation in OR  Informed Consent: I have reviewed the patients History and Physical, chart, labs and discussed the procedure including the risks, benefits and alternatives for the proposed anesthesia with the patient or authorized representative who has indicated his/her understanding and acceptance.   Dental advisory given  Plan Discussed with: CRNA and Surgeon  Anesthesia Plan Comments:         Anesthesia Quick Evaluation

## 2016-12-31 NOTE — Discharge Summary (Signed)
Discharge summary job [id number]

## 2016-12-31 NOTE — Progress Notes (Signed)
Patient went to OR to have his mouth wires removed .

## 2016-12-31 NOTE — Op Note (Signed)
Preop/postop diagnosis: Mandible fracture with retained hardware Procedure: Removal of hardware Anesthesia: Gen. Estimated blood loss: Less than 5 mL Indications: 26 year old with a previous mandible fracture and now has been free with no evidence of swelling or increasing pain with presumed good healing. His occlusion is good. He is informed risks and benefits of the procedure and options were discussed all questions are answered and consent was obtained. Procedure: Patient taken the operating placed supine position after general mask ventilation anesthesia was placed in the supine position. The [employer]ryker Leibinger screwdriver was used to remove the inferior screws along the plate. The plate was removed without difficulty. The upper plate was removed in the same fashion. There was good hemostasis in all wounds. The oral cavity oropharynx is suctioned out of all blood and debris. Patient is an awake and brought to cover stable condition counts correct

## 2016-12-31 NOTE — Progress Notes (Addendum)
Physical Therapy Note  Patient Details  Name: Chase Ayala MRN: [id number] Date of Birth: Jul 14, 1990 Today's Date: 12/31/2016  tx 1:  1130-1200, 30 min individual tx Pain: none  Pt has not eaten since yesterday as he is awaiting removal of mouth wires.  He was cooperative.    Patient demonstrates increased fall risk as noted by score of  42 /56 on Berg Balance Scale.  (<36= high risk for falls, close to 100%; 37-45 significant >80%; 46-51 moderate >50%; 52-55 lower >25%).  Improved 6 points since administered 10/10, 36/56.  PT discussed follow up for pt and need for supervision at this time.  Pt realizes that having his L arm, injured and in a sling affects his balance. Pt left resting in bed with all needs nearby.  tx 2:  1330-1553, 23 min individual tx; pt missed 52 min due to fatigue/hunger refusal as he is awaiting surgery for removal of oral wires.    PT offered to transport pt in w/c but he declined and said he would walk.   Pt eventually agreed to don shoes and ambulate to perform simulated car transfer, with supervision, without cues, safely.  PT encouraged pt to manage steps, but he refused.  "I'm hungry!  I haven't eaten and I am grumpy!"    See function navigator for current status.  Chase Ayala 12/31/2016, 12:11 PM

## 2016-12-31 NOTE — H&P (Signed)
Chase Ayala is an 26 y.o. male.   Chief Complaint: mandible fracture HPI: hx of mandible fx and now for hardware removal.  Past Medical History:  Diagnosis Date  . 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 Medical history non-contributory   . PONV (postoperative nausea and vomiting)   . Right scapula fracture 11/09/2016    Past Surgical History:  Procedure Laterality Date  . APPLICATION OF WOUND VAC N/A 11/18/2016   Procedure: APPLICATION OF WOUND VAC;  Surgeon: Jimmye NormanWyatt, James, MD;  [facility];  Service: General;  Laterality: N/A;  . BOWEL RESECTION N/A 11/18/2016   Procedure: SMALL BOWEL RESECTION;  Surgeon: Jimmye NormanWyatt, James, MD;  [facility];  Service: General;  Laterality: N/A;  . BOWEL RESECTION N/A 11/20/2016   Procedure: SMALL BOWEL RESECTION;  Surgeon: Violeta Gelinashompson, Burke, MD;  [facility];  Service: General;  Laterality: N/A;  . LAPAROTOMY N/A 11/18/2016   Procedure: EXPLORATORY LAPAROTOMY;  Surgeon: Jimmye NormanWyatt, James, MD;  [facility];  Service: General;  Laterality: N/A;  . LAPAROTOMY N/A 11/20/2016   Procedure: EXPLORATORY LAPAROTOMY;  Surgeon: Violeta Gelinashompson, Burke, MD;  [facility];  Service: General;  Laterality: N/A;  . LAPAROTOMY N/A 11/23/2016   Procedure: EXPLORATORY LAPAROTOMY, SMALL BOWEL ANASTAMOSIS AND CLOSURE;  Surgeon: Violeta Gelinashompson, Burke, MD;  [facility];  Service: General;  Laterality: N/A;  . NO PAST SURGERIES    . ORIF MANDIBULAR FRACTURE N/A 11/06/2016   Procedure: OPEN REDUCTION INTERNAL FIXATION (ORIF) RIGHT TRIPOD AND MANDIBULAR FRACTURE; CLOSED REDUCTION NASAL FRACTURE;  Surgeon: Suzanna ObeyByers, Gianni Mihalik, MD;  [facility];  Service: ENT;  Laterality: N/A;  ORIF right orbital fracture, Bilateral maxillary fracture, mandible fracture, Mandibulo-maxillary fixation, tracheostomy  . TRACHEOSTOMY TUBE PLACEMENT N/A 11/06/2016   Procedure: TRACHEOSTOMY;  Surgeon: Suzanna ObeyByers, Havish Petties, MD;  [facility];  Service: ENT;  Laterality: N/A;  . VACUUM ASSISTED CLOSURE CHANGE N/A  11/20/2016   Procedure: ABDOMINAL VACUUM ASSISTED CLOSURE CHANGE;  Surgeon: Violeta Gelinashompson, Burke, MD;  [facility];  Service: General;  Laterality: N/A;    History reviewed. No pertinent family history. Social History:  reports that he has been smoking Cigarettes.  He has been smoking about 0.50 packs per day. He has never used smokeless tobacco. He reports that he drinks alcohol. He reports that he uses drugs, including Marijuana, Cocaine, and Benzodiazepines, about 5 times per week.  Allergies: No Known Allergies  No prescriptions prior to admission.    No results found for this or any previous visit (from the past 48 hour(s)). No results found.  Review of Systems  Constitutional: Negative.   HENT: Negative.   Eyes: Negative.   Respiratory: Negative.   Cardiovascular: Negative.   Skin: Negative.     Blood pressure 132/84, pulse 79, temperature 98.7 F (37.1 C), temperature source Oral, resp. rate 17, height 5\' 11"  (1.803 m), weight 83.9 kg (185 lb), SpO2 100 %. Physical Exam  Constitutional: He appears well-developed and well-nourished.  HENT:  Head: Normocephalic and atraumatic.  Eyes: Pupils are equal, round, and reactive to light. Conjunctivae are normal.  Neck: Normal range of motion. Neck supple.  Cardiovascular: Normal rate.   Respiratory: Effort normal.  GI: Soft.     Assessment/Plan Mandible fracture- discussed hardware removal and ready to proceed  Suzanna ObeyBYERS, Kaida Games, MD 12/31/2016, 3:40 PM

## 2016-12-31 NOTE — Transfer of Care (Signed)
Immediate Anesthesia Transfer of Care Note  Patient: Chase Ayala  Procedure(s) Performed: MANDIBULAR HARDWARE REMOVAL (N/A )  Patient Location: PACU  Anesthesia Type:MAC  Level of Consciousness: awake, alert , oriented and patient cooperative  Airway & Oxygen Therapy: Patient Spontanous Breathing and Patient connected to nasal cannula oxygen  Post-op Assessment: Report given to RN and Post -op Vital signs reviewed and stable  Post vital signs: Reviewed and stable  Last Vitals:  Vitals:   12/30/16 1455 12/31/16 0500  BP: (!) 145/79 132/84  Pulse: 84 79  Resp: 18 17  Temp: 37 C 37.1 C  SpO2: 100% 100%    Last Pain:  Vitals:   12/31/16 0816  TempSrc:   PainSc: 0-No pain      Patients Stated Pain Goal: 3 (77/41/28 7867)  Complications: No apparent anesthesia complications

## 2016-12-31 NOTE — Progress Notes (Signed)
Procedure yesterday was canceled for removal of jaw wires per RN. Plan is to remove today as reported by RN. Patient again was made NPO for procedure. ENT to be contacted again today for confirmation of procedure prior to patient's discharge from the hospital 01/01/2017

## 2016-12-31 NOTE — Progress Notes (Signed)
Speech Language Pathology Daily Session Note  Patient Details  Name: Chase Ayala MRN: [id number] Date of Birth: 05-02-1990  Today's Date: 12/31/2016 SLP Individual Time: 0730-0830 SLP Individual Time Calculation (min): 60 min  Short Term Goals: Week 2: SLP Short Term Goal 1 (Week 2): Pt will consume full liquids diet with supervision cues for rate and portion control and minimal overt s/s of aspiration.   SLP Short Term Goal 2 (Week 2): Pt will complete semi-complex cognitive tasks with min assist verbal cues for functional problem solving.   SLP Short Term Goal 3 (Week 2): Pt will recognize and correct errors in the moment during semi-complex tasks wtih min verbal cues.  SLP Short Term Goal 4 (Week 2): Pt will recall semi-complex daily information with supervision question cues.   SLP Short Term Goal 5 (Week 2): Pt will selectively attend to tasks in a mildly distracting environment for ~30 minutes with min verbal cues for redirection.    Skilled Therapeutic Interventions: Skilled treatment session focused on cognitive goals. SLP facilitated session by providing education to the patient and his mother in regards to his current cognitive function and strategies to utilize at home to maximize recall and overall safety. Both verbalized understanding and agreement. SLP also facilitated session by providing extra time and overall Mod I for patient to perform basic self-care tasks and for basic medication management in regards to organizing a 4x/per day pill box. Patient left upright in bed with mom present. Continue with current plan of care.      Function:   Cognition Comprehension Comprehension assist level: Follows basic conversation/direction with no assist  Expression   Expression assist level: Expresses basic needs/ideas: With no assist  Social Interaction Social Interaction assist level: Interacts appropriately 90% of the time - Needs monitoring or encouragement for  participation or interaction.  Problem Solving Problem solving assist level: Solves basic 75 - 89% of the time/requires cueing 10 - 24% of the time  Memory Memory assist level: Recognizes or recalls 90% of the time/requires cueing < 10% of the time    Pain No/Denies Pain  Therapy/Group: Individual Therapy  Elisandra Deshmukh 12/31/2016, 3:35 PM
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
## 2016-12-31 NOTE — Progress Notes (Signed)
Occupational Therapy Discharge Summary  Patient Details  Name: Garon Melander MRN: [id number] Date of Birth: 12-14-1990  Today's Date: 12/31/2016 OT Individual Time: 1015-1130 OT Individual Time Calculation (min): 75 min    Patient has met 10 of 10 long term goals due to improved activity tolerance, improved balance, ability to compensate for deficits, improved attention, improved awareness and improved coordination.  Patient to discharge at overall Supervision level.  Patient's care partner is independent to provide the necessary supervision assistance at discharge.    Reasons goals not met: all goals met  Recommendation:  Patient will benefit from ongoing skilled OT services in home health setting to continue to advance functional skills in the area of BADL and iADL.  Equipment: No equipment provided  Reasons for discharge: treatment goals met  Patient/family agrees with progress made and goals achieved: Yes   OT Intervention: Upon entering the room, pt supine in bed awaiting therapist. Pt reports his mother assisted him with shower this session. Pt given extra shower guards for home use at discharge for abdominal dressing. Pt performed supine >sit with supervision. Pt standing at sink for grooming tasks with supervision as well. Pt engaged in dynavision task for continuous 2 minute task with pt able to hit 96 targets within two minutes and average reaction time of 1.25 which has increased significantly since evaluation. Pt able to perform most difficulty pattern with resistive peg board with increased time. Pt given trail making test part A with score of 45 seconds which is 30 seconds faster than last week. Pt given part B which he continues to have difficulty with and finished at same time as last session. Pt also with multiple errors for this part of test in which pt was able to identify with min verbal questioning cues. Pt returning to room at end of session with call bell and  all needed items within reach upon exiting the room.    OT Discharge Precautions/Restrictions  Precautions Precautions: Fall Precaution Comments: position LUE for good positioning of left shoulder and arm  (+) subluxation, abdominal incision Required Braces or Orthoses: Sling;Other Brace/Splint Other Brace/Splint:  sling left arm when OOB/mobilizing.  Restrictions RUE Weight Bearing: Weight bearing as tolerated LUE Weight Bearing: Weight bearing as tolerated Other Position/Activity Restrictions: PROM for L UE digits, wrist, elbow, and shoulder General PT Missed Treatment Reason: Patient unwilling to participate (NPO since last night, awaiting removeal of jaw wires) Vital Signs Therapy Vitals Temp: 97.6 F (36.4 C) Pulse Rate: 63 Resp: 16 BP: 131/90 Patient Position (if appropriate): Lying Oxygen Therapy SpO2: 100 % O2 Device: Not Delivered O2 Flow Rate (L/min): 2 L/min Pain Pain Assessment Pain Assessment: 0-10 Pain Score: 2  Pain Type: Acute pain Pain Location: Shoulder Pain Orientation: Left Pain Descriptors / Indicators: Discomfort;Guarding Pain Onset: On-going Patients Stated Pain Goal: 1 Pain Intervention(s): RN made aware Vision Baseline Vision/History: No visual deficits Patient Visual Report: No change from baseline Cognition Orientation Level: Oriented to person;Oriented to place;Oriented to situation Sensation Sensation Light Touch: Impaired Detail Light Touch Impaired Details: Impaired LUE Coordination Gross Motor Movements are Fluid and Coordinated: Yes Fine Motor Movements are Fluid and Coordinated: No Mobility  Bed Mobility Bed Mobility: Rolling Right;Right Sidelying to Sit;Rolling Left Rolling Right: 6: Modified independent (Device/Increase time) Right Sidelying to Sit: 6: Modified independent (Device/Increase time) Supine to Sit: 6: Modified independent (Device/Increase time) Transfers Transfers: Sit to Stand;Stand to Sit Sit to Stand: 5:  Supervision Stand to Sit: 5: Supervision  Trunk/Postural Assessment  Cervical Assessment Cervical Assessment: Exceptions to [facility] Thoracic Assessment Thoracic Assessment: Exceptions to [facility] Lumbar Assessment Lumbar Assessment: Exceptions to [facility] Postural Control Postural Control: Within Functional Limits  Balance Balance Balance Assessed: Yes Dynamic Sitting Balance Sitting balance - Comments: Mod I Static Standing Balance Static Standing - Level of Assistance: 5: Stand by assistance Extremity/Trunk Assessment RUE Assessment RUE Assessment: Within Functional Limits LUE Assessment LUE Assessment: Not tested (sublux)   See Function Navigator for Current Functional Status.  Gypsy Decant 12/31/2016, 5:33 PM

## 2016-12-31 NOTE — H&P (Deleted)
  The note originally documented on this encounter has been moved the the encounter in which it belongs.  
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
## 2016-12-31 NOTE — Progress Notes (Signed)
Physical Therapy Discharge Summary  Patient Details  Name: Chase Ayala MRN: [id number] Date of Birth: 09-07-1990  Patient has met 79 of 11 long term goals due to improved activity tolerance, improved balance, improved postural control, ability to compensate for deficits, functional use of  right upper extremity, left upper extremity and left lower extremity and improved awareness.  Patient to discharge at an ambulatory level Supervision.   Patient's care partner is independent to provide the necessary cognitive assistance at discharge.  Reasons goals not met: na  Recommendation:  Patient will benefit from ongoing skilled PT services in outpatient setting to continue to advance safe functional mobility, address ongoing impairments in activity tolerance, strength, high level balance, coordination, gait, LUE use when appropriate, and minimize fall risk.  Equipment: No equipment provided  Reasons for discharge: treatment goals met and discharge from hospital  Patient/family agrees with progress made and goals achieved: Yes  PT Discharge Precautions/Restrictions- NWBing LUE, probably brachial plexus injury; subluxed; sling when OOB     Pain- none per pt   Vision/Perception nystagmus with head movements during sit> floor for floor transfer; pt stated this was from the ceiling lights (?)    Cognition- A and O x 4   Sensation Sensation Light Touch: Impaired Detail Light Touch Impaired Details: Impaired LUE (parashesias) Coordination Gross Motor Movements are Fluid and Coordinated: Yes Fine Motor Movements are Fluid and Coordinated: No (alternating heel/toe with R/L demonstrates deficits in complex alternating movements) Heel Shin Test: wnls bil Motor - generalized mild weakness bil LEs, moderate RUE, severe weakness LUE    Mobility Bed Mobility Bed Mobility: Rolling Right;Right Sidelying to Sit;Rolling Left Rolling Right: 6: Modified independent (Device/Increase  time) Rolling Left: Other (comment) (avoids due to pain L shoulder) Right Sidelying to Sit: 6: Modified independent (Device/Increase time) Supine to Sit: 6: Modified independent (Device/Increase time) Sit to Supine: 6: Modified independent (Device/Increase time) Transfers Transfers: Yes Sit to Stand: 5: Supervision Stand to Sit: 5: Supervision Stand Pivot Transfers: 5: Supervision Stand Pivot Transfer Details (indicate cue type and reason): pt describes rare dizziness upon moving head Locomotion  Ambulation Ambulation: Yes Ambulation/Gait Assistance: 5: Supervision Ambulation Distance (Feet): 200 Feet Assistive device: None Ambulation/Gait Assistance Details: slight weaving when fatigued Gait Gait: Yes Gait Pattern: Impaired Gait Pattern: Decreased trunk rotation;Narrow base of support;Step-through pattern Gait velocity: decreased for age 88 / Additional Locomotion Stairs: Yes (on 10/11; pt has declined since then) Stair Management Technique: One rail Right Number of Stairs: 24 Height of Stairs: 6 (and 3) Wheelchair Mobility Wheelchair Mobility: Yes (not at d/c) Wheelchair Parts Management: Supervision/cueing Distance: 150  Trunk/Postural Assessment  Cervical Assessment Cervical Assessment: Exceptions to [facility] (self limited rotation and extension) Thoracic Assessment Thoracic Assessment: Exceptions to [facility] (kyphotic) Lumbar Assessment Lumbar Assessment: Exceptions to [facility] (sits in posterior pelvic tilt) Postural Control Postural Control: Within Functional Limits  Balance Balance Balance Assessed: Yes Standardized Balance Assessment Standardized Balance Assessment: Berg Balance Test Berg Balance Test Sit to Stand: Able to stand without using hands and stabilize independently Standing Unsupported: Able to stand safely 2 minutes Sitting with Back Unsupported but Feet Supported on Floor or Stool: Able to sit safely and securely 2 minutes Stand to Sit: Sits safely with  minimal use of hands Transfers: Able to transfer with verbal cueing and /or supervision Standing Unsupported with Eyes Closed: Able to stand 10 seconds with supervision Standing Ubsupported with Feet Together: Able to place feet together independently and stand for 1 minute with supervision From Standing,  Reach Forward with Outstretched Arm: Can reach confidently >25 cm (10") From Standing Position, Pick up Object from Floor: Able to pick up shoe, needs supervision From Standing Position, Turn to Look Behind Over each Shoulder: Looks behind from both sides and weight shifts well Turn 360 Degrees: Needs close supervision or verbal cueing Standing Unsupported, Alternately Place Feet on Step/Stool: Able to complete >2 steps/needs minimal assist Standing Unsupported, One Foot in Front: Able to take small step independently and hold 30 seconds Standing on One Leg: Able to lift leg independently and hold 5-10 seconds Total Score: 42 Static Standing Balance Static Standing - Level of Assistance: 5: Stand by assistance Extremity Assessment      RLE Assessment RLE Assessment: Exceptions to [facility] RLE Strength RLE Overall Strength Comments: grossly 4+/5 except adductors 4/5 LLE Assessment LLE Assessment: Exceptions to [facility] LLE Strength LLE Overall Strength Comments: grossly 4+/5 except adductors 4/5   See Function Navigator for Current Functional Status.  Rebacca Votaw 12/31/2016, 2:10 PM

## 2017-01-01 ENCOUNTER — Encounter (HOSPITAL_COMMUNITY): Payer: Self-pay | Admitting: Otolaryngology

## 2017-01-01 MED ORDER — PREGABALIN 100 MG PO CAPS
100.0000 mg | ORAL_CAPSULE | Freq: Three times a day (TID) | ORAL | 0 refills | Status: DC
Start: 2017-01-01 — End: 2017-01-15

## 2017-01-01 MED ORDER — CLONAZEPAM 0.5 MG PO TABS: 0.5000 mg | ORAL_TABLET | Freq: Three times a day (TID) | ORAL | 0 refills | Status: DC | PRN

## 2017-01-01 MED ORDER — PANTOPRAZOLE SODIUM 40 MG PO TBEC
40.0000 mg | DELAYED_RELEASE_TABLET | Freq: Every day | ORAL | 0 refills | Status: DC
Start: 2017-01-02 — End: 2017-01-27

## 2017-01-01 MED ORDER — METOPROLOL TARTRATE 75 MG PO TABS: 75.0000 mg | ORAL_TABLET | Freq: Two times a day (BID) | ORAL | 0 refills | Status: DC

## 2017-01-01 MED ORDER — HYDROCODONE-ACETAMINOPHEN 7.5-325 MG PO TABS: 1.0000 | ORAL_TABLET | Freq: Four times a day (QID) | ORAL | 0 refills | Status: DC | PRN

## 2017-01-01 MED ORDER — POLYETHYLENE GLYCOL 3350 17 G PO PACK
17.0000 g | PACK | Freq: Every day | ORAL | 0 refills | Status: DC
Start: 2017-01-02 — End: 2019-10-09
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
## 2017-01-01 NOTE — Discharge Instructions (Signed)
Inpatient Rehab Discharge Instructions  Romyn Marcelino ScotO'Brian Lacosse Discharge date and time: No discharge date for patient encounter.   Activities/Precautions/ Functional Status: Activity: as tolerated Diet: Dysphagia 2 thin liquids Wound Care: keep wounds clean and dry Functional status:  ___ No restrictions     ___ Walk up steps independently ___ 24/7 supervision/assistance   ___ Walk up steps with assistance ___ Intermittent supervision/assistance  ___ Bathe/dress independently ___ Walk with walker     _x__ Bathe/dress with assistance ___ Walk Independently    ___ Shower independently ___ Walk with assistance    ___ Shower with assistance ___ No alcohol     ___ Return to work/school ________      COMMUNITY REFERRALS UPON DISCHARGE:    Home Health:   PT     OT     ST                      Agency:  [facility] Phone: [phone]     Special Instructions:  No driving smoking or alcohol   Wet-to-dry dressings daily to abdomen  My questions have been answered and I understand these instructions. I will adhere to these goals and the provided educational materials after my discharge from the hospital.  Patient/Caregiver Signature _______________________________ Date __________  Clinician Signature _______________________________________ Date __________  Please bring this form and your medication list with you to all your follow-up doctor's appointments.

## 2017-01-01 NOTE — Anesthesia Postprocedure Evaluation (Signed)
Anesthesia Post Note  Patient: Chase Ayala  Procedure(s) Performed: MANDIBULAR HARDWARE REMOVAL (N/A )     Patient location during evaluation: PACU Anesthesia Type: MAC Level of consciousness: awake and alert Pain management: pain level controlled Vital Signs Assessment: post-procedure vital signs reviewed and stable Respiratory status: spontaneous breathing, nonlabored ventilation, respiratory function stable and patient connected to nasal cannula oxygen Cardiovascular status: stable and blood pressure returned to baseline Postop Assessment: no apparent nausea or vomiting Anesthetic complications: no    Last Vitals:  Vitals:   12/31/16 1730 01/01/17 0600  BP: 134/82 (!) 147/92  Pulse: 63 87  Resp: 17 16  Temp: 36.7 C 36.7 C  SpO2: 100% 100%    Last Pain:  Vitals:   01/01/17 0600  TempSrc: Axillary  PainSc:                  [name] S

## 2017-01-01 NOTE — Progress Notes (Signed)
Social Work  Discharge Note  The overall goal for the admission was met for:   Discharge location: Yes - home with mother providing 24/7 supervision  Length of Stay: Yes - 17 days  Discharge activity level: Yes - supervision  Home/community participation: Yes  Services provided included: MD, RD, PT, OT, SLP, RN, TR, Pharmacy, Neuropsych and SW  Financial Services: None - Medicaid and SSD apps pending  Follow-up services arranged: Home Health: PT, OT, ST via [facility] and Patient/Family has no preference for HH/DME agencies  Comments (or additional information):  Patient/Family verbalized understanding of follow-up arrangements: Yes  Individual responsible for coordination of the follow-up plan: pt/mom  Confirmed correct DME delivered: NA   Rafay Dahan

## 2017-01-01 NOTE — Progress Notes (Signed)
Patient and family discussed discharge instructions with Chase Ayala A  PA before they left.

## 2017-01-01 NOTE — Progress Notes (Signed)
Speech Language Pathology Discharge Summary  Patient Details  Name: Chase Ayala MRN: [id number] Date of Birth: 1990/07/07  Patient has met 7 of 7 long term goals.  Patient to discharge at overall Min;Supervision level.   Reasons goals not met: N/A   Clinical Impression/Discharge Summary: Patient has made excellent gains and has met 7 of 7 LTG's this admission. Currently, patient is demonstrating behaviors consistent with a Rancho Level VIII and requires overall supervision-Min A to complete functional and mildly complex tasks safely in regards to problem solving, recall and anticipatory awareness. Patient also had procedure late yesterday afternoon to remove wires from mandibular fixation and is currently consuming Dys. 2 textures with thin liquids due to decreased mandibular ROM.  Suspect patient can upgrade quickly as ROM improves with functional use. Patient has also been decannulated and is expressing his wants/needs within Mod I and is 100% intelligible at the conversation level. Patient and family education is complete and patient will will discharge home with 24 hour supervision from family. Patient would benefit from f/u outpatient SLP services to maximize his overall cognitive function and functional independence.   Care Partner:  Caregiver Able to Provide Assistance: Yes     Recommendation:  Outpatient SLP;24 hour supervision/assistance  Rationale for SLP Follow Up: Maximize cognitive function and independence   Equipment: N/A   Reasons for discharge: Discharged from hospital;Treatment goals met   Patient/Family Agrees with Progress Made and Goals Achieved: Yes     [name], Dickenson 01/01/2017, 6:47 AM

## 2017-01-04 ENCOUNTER — Telehealth: Payer: Self-pay | Admitting: *Deleted

## 2017-01-04 MED ORDER — GABAPENTIN 300 MG PO CAPS: 300.0000 mg | ORAL_CAPSULE | Freq: Three times a day (TID) | ORAL | 3 refills | Status: DC

## 2017-01-04 NOTE — Telephone Encounter (Signed)
rx for gabapentin sent to [facility] pharmacy

## 2017-01-04 NOTE — Telephone Encounter (Signed)
Physical therapist is asking for verbal orders for skilled nursing visit to assist with wound care..... Also she also reports that patient is having a difficult transition to solid foods.  Speech Therapy evaluation is forthcoming, but, until then, physical therapist is asking if Dr. Riley KillSwartz has any nutritional supplement suggestions to help patient until Speech therapy arrives.  Please advise on skilled nursing visits and nutritional supplements

## 2017-01-04 NOTE — Telephone Encounter (Signed)
Physical [facility]  Recommends 2week3 followed by [employer]1week3.  Verbal orders given per office protocol.

## 2017-01-04 NOTE — Telephone Encounter (Signed)
Patients mother called, left a message stating that her son was recently discharged with a script for Lyrica.  The Lyrica cost $600.00.  They are asking for a more affordable alternative.  Please advise

## 2017-01-05 NOTE — Telephone Encounter (Signed)
I would continue to try more solid foods  , use chewing gum, etc to help exercise his jaw muscles.  To sustain nutrition would use softer foods, dietary supps/shakes/etc.

## 2017-01-05 NOTE — Telephone Encounter (Signed)
Gave verbal orders for skilled nursing wound care visits, advised on  Nutrition suggestions

## 2017-01-07 ENCOUNTER — Telehealth: Payer: Self-pay | Admitting: *Deleted

## 2017-01-07 NOTE — Telephone Encounter (Signed)
Chase DerryAllison Ayala called about Chase [facility] BP being elevated but apparently wife was told that main thing was to watch his heart rate which is 66.  (BP was 145/100). ST has completed and his resources need to be concentrated on PT/OT so they are discharging ST services.

## 2017-01-08 ENCOUNTER — Telehealth: Payer: Self-pay

## 2017-01-08 NOTE — Telephone Encounter (Signed)
ERROR

## 2017-01-10 ENCOUNTER — Encounter (HOSPITAL_COMMUNITY): Payer: Self-pay | Admitting: General Surgery

## 2017-01-12 ENCOUNTER — Telehealth: Payer: Self-pay

## 2017-01-12 NOTE — Telephone Encounter (Signed)
[facility] orders for an extension on her visits regarding wound care for 4-6 visits, called Sharyl NimrodMeredith back and approved verbal orders.

## 2017-01-12 NOTE — Telephone Encounter (Signed)
[facility] requesting verbal orders for 3xwk X 1wk, then 2xwk X 2wks, then 1xwk X 1wk, called diane back and gave approval for verbal orders

## 2017-01-15 ENCOUNTER — Telehealth: Payer: Self-pay | Admitting: *Deleted

## 2017-01-15 NOTE — Telephone Encounter (Signed)
Chase Ayala OT called and pts mother is concerned because he has not been able to get Lyrica filled because of the cost $600.  She says that calls have been made to office about this but they have not heard anything.  I looked in medications and Dr Riley KillSwartz ordered gabapentin 01/04/17 sent to [facility].  Mother says that they did not know and no one told them. He also needs a refill on his hydrocodone because he was only able to get 5 days.  I asked that his appt be moved up to be seen (hosp follow up 02/01/17.)  It could only be moved to 01/27/17.  I will ask Dr Riley KillSwartz if he wants to ok an Rx to be picked up.

## 2017-01-27 ENCOUNTER — Encounter: Payer: Self-pay | Admitting: Registered Nurse

## 2017-01-27 ENCOUNTER — Encounter: Payer: Self-pay | Admitting: Physical Medicine & Rehabilitation

## 2017-01-27 ENCOUNTER — Encounter: Payer: Self-pay | Attending: Physical Medicine & Rehabilitation | Admitting: Physical Medicine & Rehabilitation

## 2017-01-27 ENCOUNTER — Other Ambulatory Visit: Payer: Self-pay

## 2017-01-27 VITALS — BP 133/83 | HR 62

## 2017-01-27 DIAGNOSIS — S143XXS Injury of brachial plexus, sequela: Secondary | ICD-10-CM

## 2017-01-27 DIAGNOSIS — Z87820 Personal history of traumatic brain injury: Secondary | ICD-10-CM | POA: Insufficient documentation

## 2017-01-27 DIAGNOSIS — Z5189 Encounter for other specified aftercare: Secondary | ICD-10-CM | POA: Insufficient documentation

## 2017-01-27 DIAGNOSIS — S062X3S Diffuse traumatic brain injury with loss of consciousness of 1 hour to 5 hours 59 minutes, sequela: Secondary | ICD-10-CM

## 2017-01-27 DIAGNOSIS — Z87891 Personal history of nicotine dependence: Secondary | ICD-10-CM | POA: Insufficient documentation

## 2017-01-27 MED ORDER — GABAPENTIN 300 MG PO CAPS: 300.0000 mg | ORAL_CAPSULE | Freq: Four times a day (QID) | ORAL | 3 refills | Status: DC

## 2017-01-27 MED ORDER — BACLOFEN 10 MG PO TABS: 10.0000 mg | ORAL_TABLET | Freq: Three times a day (TID) | ORAL | 3 refills | Status: DC
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
## 2017-01-27 NOTE — Patient Instructions (Signed)
1. INCREASED GABAPENTIN TO 300MG  4X DAILY  2. ADDED BACLOFEN FOR MUSCLES SPASMS:  ONE TABLET TWICE DAILY FOR  A WEEK THEN INCREASE TO THREE X DAILY  3. CAN USE IBUPROFEN 600-800MG  EVERY 8 HOURS AS NEEDED  4. CAN USE TYLENOL 500MG  EVERY 4 HOURS AS NEEDED.    PLEASE FEEL FREE TO CALL OUR OFFICE WITH ANY PROBLEMS OR QUESTIONS [phone])

## 2017-02-01 ENCOUNTER — Inpatient Hospital Stay: Payer: Self-pay | Admitting: Physical Medicine & Rehabilitation

## 2017-02-01 DIAGNOSIS — D62 Acute posthemorrhagic anemia: Secondary | ICD-10-CM

## 2017-02-01 DIAGNOSIS — S02609D Fracture of mandible, unspecified, subsequent encounter for fracture with routine healing: Secondary | ICD-10-CM

## 2017-02-01 DIAGNOSIS — S143XXD Injury of brachial plexus, subsequent encounter: Secondary | ICD-10-CM

## 2017-02-01 DIAGNOSIS — I10 Essential (primary) hypertension: Secondary | ICD-10-CM

## 2017-02-01 DIAGNOSIS — K651 Peritoneal abscess: Secondary | ICD-10-CM

## 2017-02-01 DIAGNOSIS — R1310 Dysphagia, unspecified: Secondary | ICD-10-CM

## 2017-02-01 DIAGNOSIS — F1010 Alcohol abuse, uncomplicated: Secondary | ICD-10-CM

## 2017-02-01 DIAGNOSIS — S022XXD Fracture of nasal bones, subsequent encounter for fracture with routine healing: Secondary | ICD-10-CM

## 2017-02-01 DIAGNOSIS — S066X0D Traumatic subarachnoid hemorrhage without loss of consciousness, subsequent encounter: Secondary | ICD-10-CM

## 2017-02-01 DIAGNOSIS — S0240CD Maxillary fracture, right side, subsequent encounter for fracture with routine healing: Secondary | ICD-10-CM

## 2017-02-01 DIAGNOSIS — S065X0D Traumatic subdural hemorrhage without loss of consciousness, subsequent encounter: Secondary | ICD-10-CM

## 2017-02-01 DIAGNOSIS — T79A3XD Traumatic compartment syndrome of abdomen, subsequent encounter: Secondary | ICD-10-CM

## 2017-03-02 ENCOUNTER — Encounter: Payer: Self-pay | Admitting: Physical Medicine & Rehabilitation

## 2017-04-19 ENCOUNTER — Encounter: Payer: Self-pay | Attending: Physical Medicine & Rehabilitation | Admitting: Physical Medicine & Rehabilitation

## 2017-04-19 ENCOUNTER — Encounter: Payer: Self-pay | Admitting: Physical Medicine & Rehabilitation

## 2017-04-19 VITALS — BP 132/76 | HR 70

## 2017-04-19 DIAGNOSIS — Z87820 Personal history of traumatic brain injury: Secondary | ICD-10-CM | POA: Insufficient documentation

## 2017-04-19 DIAGNOSIS — Z87891 Personal history of nicotine dependence: Secondary | ICD-10-CM | POA: Insufficient documentation

## 2017-04-19 DIAGNOSIS — S143XXS Injury of brachial plexus, sequela: Secondary | ICD-10-CM

## 2017-04-19 DIAGNOSIS — Z5189 Encounter for other specified aftercare: Secondary | ICD-10-CM | POA: Insufficient documentation

## 2017-04-19 DIAGNOSIS — S062X3S Diffuse traumatic brain injury with loss of consciousness of 1 hour to 5 hours 59 minutes, sequela: Secondary | ICD-10-CM

## 2017-04-19 MED ORDER — GABAPENTIN 400 MG PO CAPS: 800.0000 mg | ORAL_CAPSULE | Freq: Three times a day (TID) | ORAL | 3 refills | Status: DC
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
## 2017-04-19 NOTE — Patient Instructions (Addendum)
PLEASE FEEL FREE TO CALL OUR OFFICE WITH ANY PROBLEMS OR QUESTIONS [phone])    1. Naproxen 220mg  OTC 1-2 tabs with meal twice daily  2. Gabapentin: Start with 400 mg 4 times daily for 4 days then  Then increase to 400-400-400-800 for 4 days Then increase to 800-400-400-800 for 4 days Then increase to 800-400-800-800 for 4 days And then from there on take it 800 mg 4 times daily

## 2017-06-16 ENCOUNTER — Encounter: Payer: Self-pay | Attending: Physical Medicine & Rehabilitation | Admitting: Physical Medicine & Rehabilitation

## 2017-06-16 DIAGNOSIS — Z87820 Personal history of traumatic brain injury: Secondary | ICD-10-CM | POA: Insufficient documentation

## 2017-06-16 DIAGNOSIS — Z87891 Personal history of nicotine dependence: Secondary | ICD-10-CM | POA: Insufficient documentation

## 2017-06-16 DIAGNOSIS — Z5189 Encounter for other specified aftercare: Secondary | ICD-10-CM | POA: Insufficient documentation
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
## 2018-05-03 IMAGING — DX DG CHEST 1V PORT
1 series · 1 of 1 positions shown · non-contrast
Comparison: 11/24/2016.  11/21/2016.

CLINICAL DATA: Respiratory failure.

EXAM:
PORTABLE CHEST 1 VIEW

[chest ap]
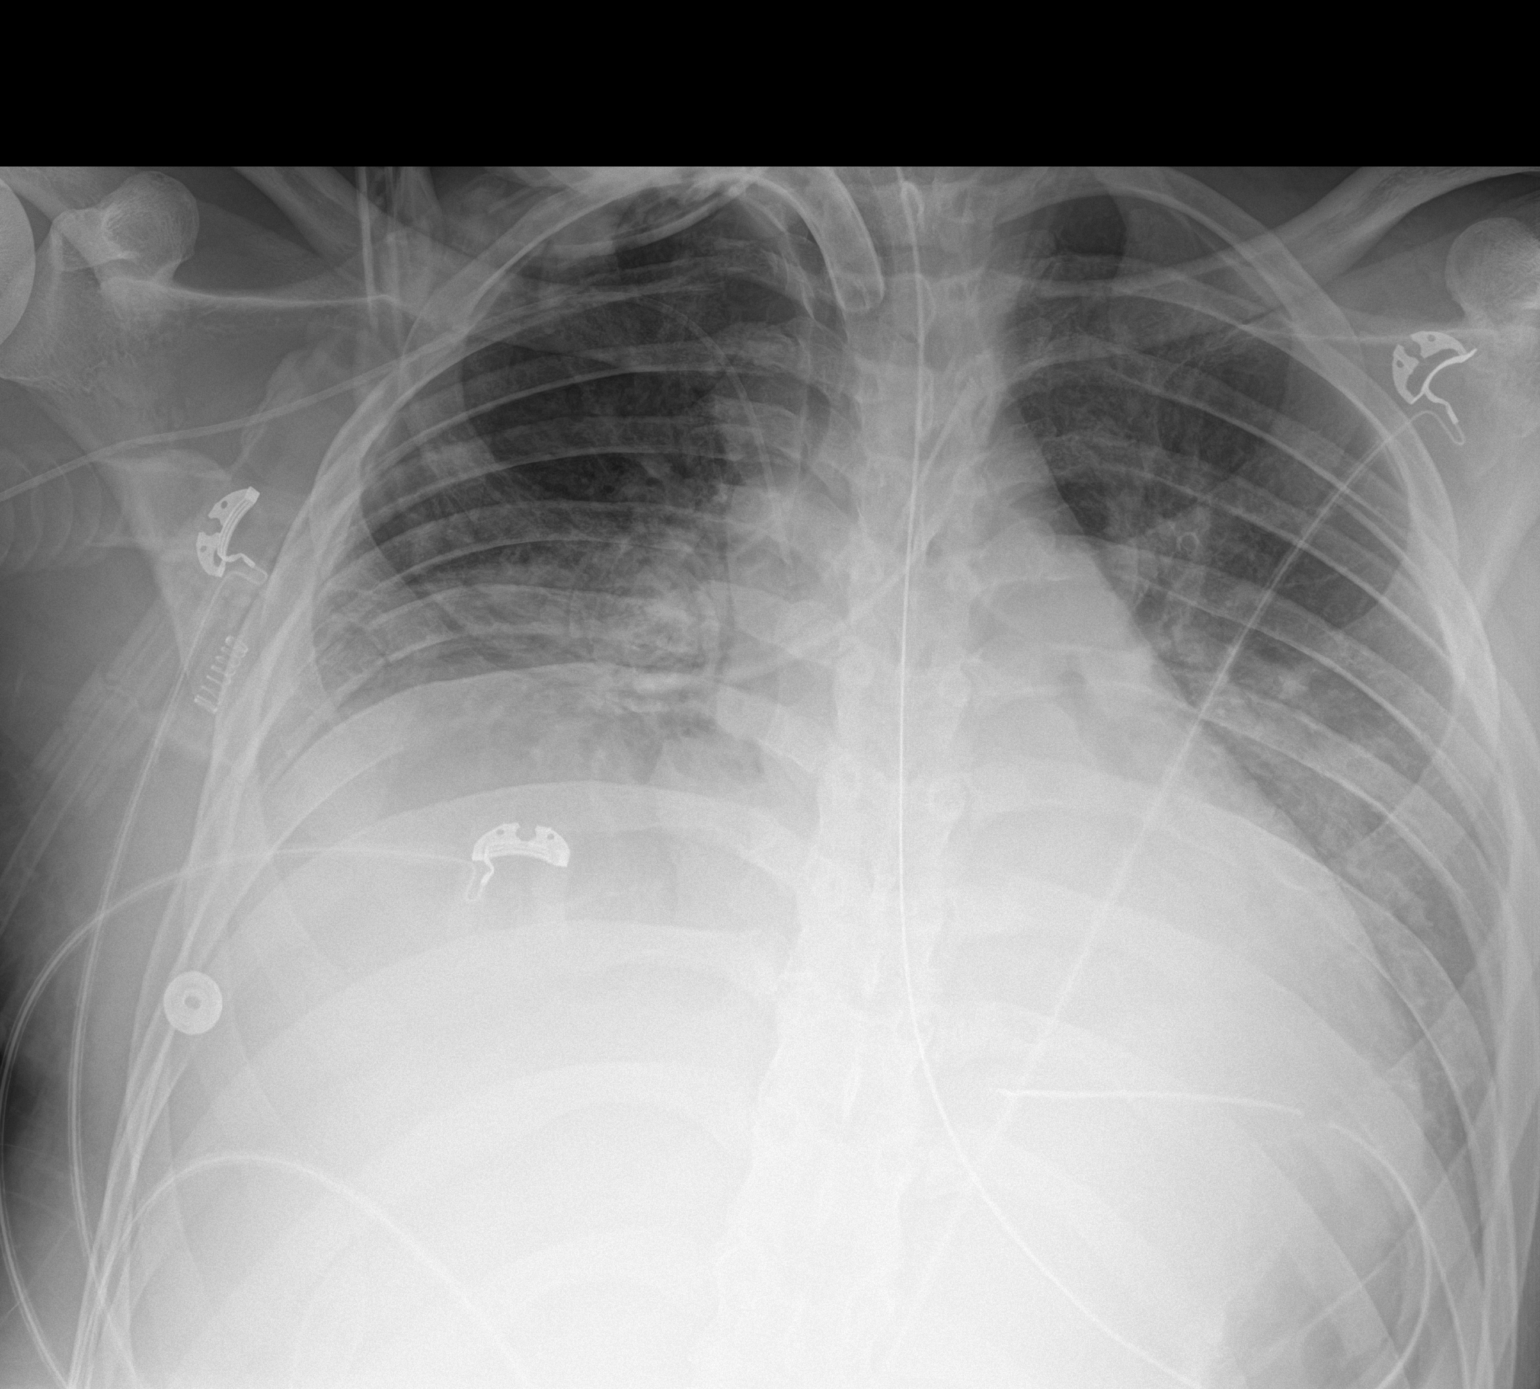

[1 of 1 positions shown; findings below may reference images not displayed]

FINDINGS: Tracheostomy tube, NG tube, right PICC line stable position. Heart
size normal. Bilateral pulmonary infiltrates/edema again noted. No
prominent pleural effusion or pneumothorax. Degenerative changes
thoracolumbar spine.
IMPRESSION: 1.  Lines and tubes in stable position.

2. Diffuse bilateral pulmonary infiltrates and/or edema again noted.
No interim change from prior exam. Persistent basilar atelectasis.

## 2018-05-14 IMAGING — CT CT ABD-PELV W/ CM
2 of 4 series · 14 of 46 positions shown, 16 images · IV contrast (iopamidol)
Comparison: Multiple exams, including 11/29/2016

CLINICAL DATA: Abdominal pain and fever. Possible abscess.
Abdominal vacuum assisted was tear. Laparotomy and bowel resection.

EXAM:
CT ABDOMEN AND PELVIS WITH CONTRAST
TECHNIQUE: Multidetector CT imaging of the abdomen and pelvis was performed
using the standard protocol following bolus administration of
intravenous contrast.
CONTRAST:  100mL NJRAXL-3UU IOPAMIDOL (NJRAXL-3UU) INJECTION 61%

[Series 3: abdomen 5.0 · axial · 0.75mm/px · z∈[-564,-154]mm · 11 of 100 slices shown, 13 images]
[im 9/100  soft-tissue]
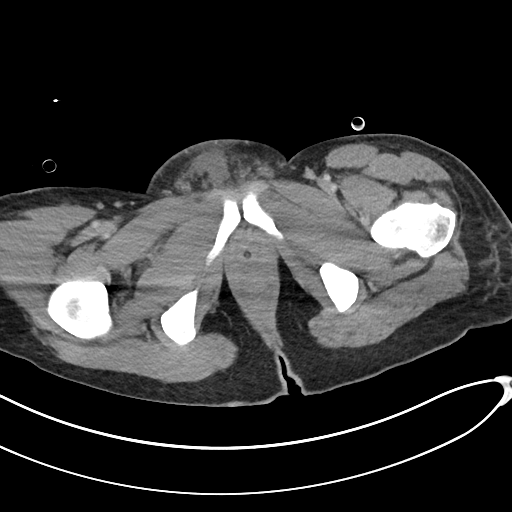
[im 9/100  bone]
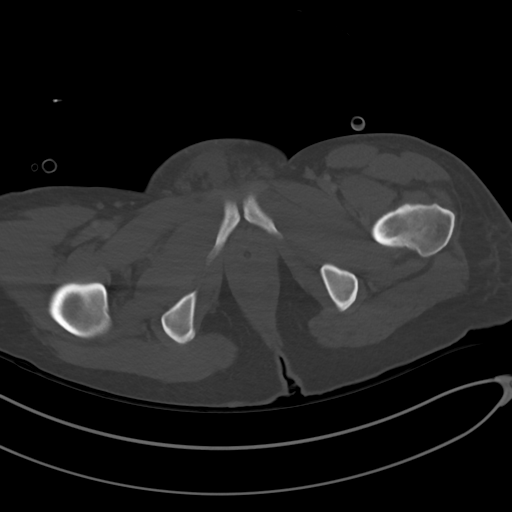
[im 17/100  soft-tissue]
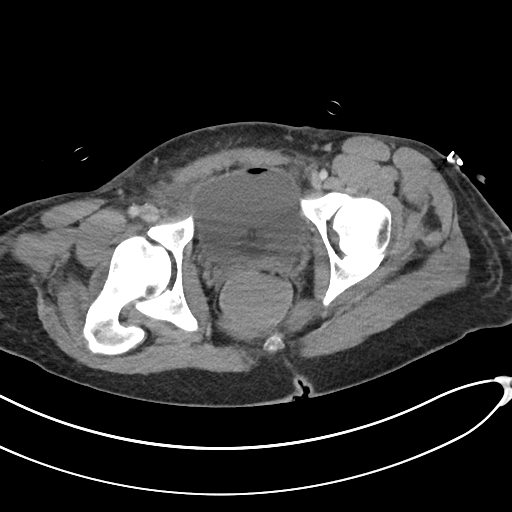
[im 25/100  soft-tissue]
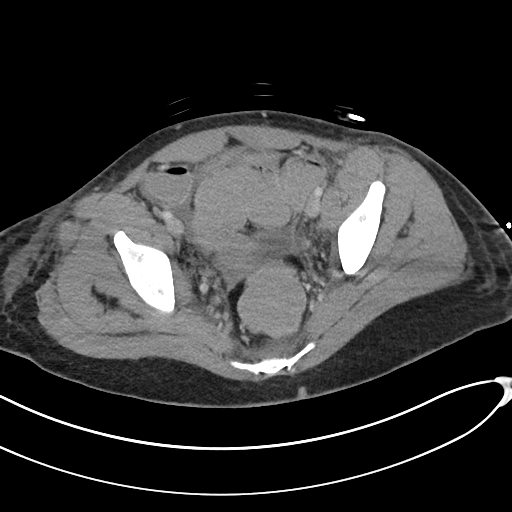
[im 34/100  soft-tissue]
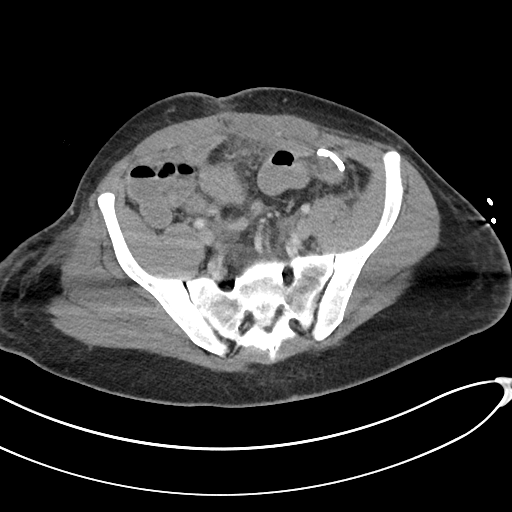
[im 42/100  soft-tissue]
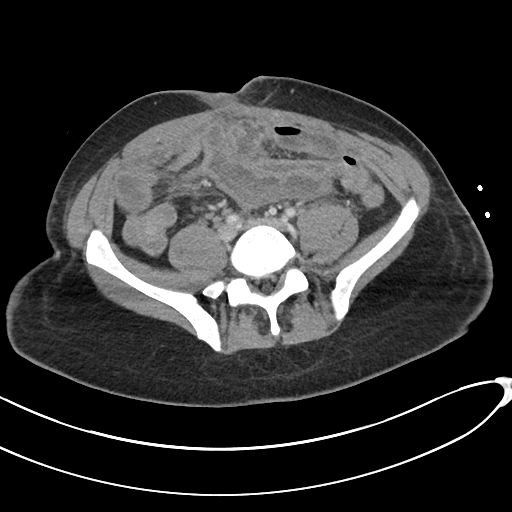
[im 50/100  soft-tissue]
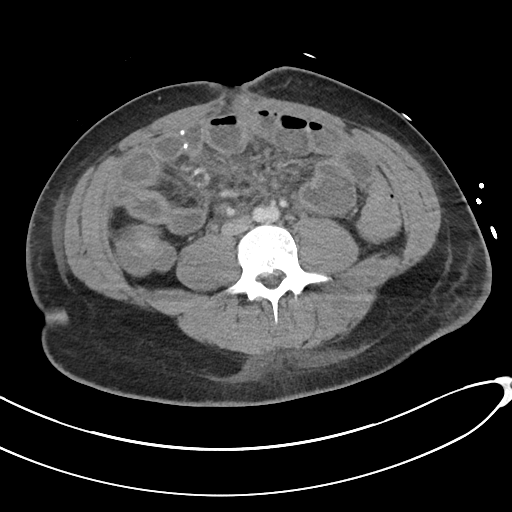
[im 58/100  soft-tissue]
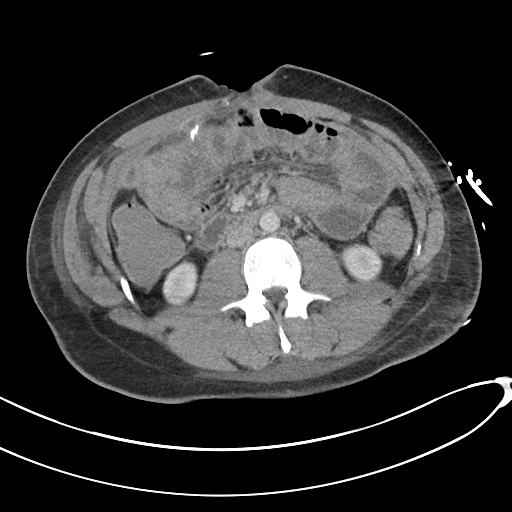
[im 67/100  soft-tissue]
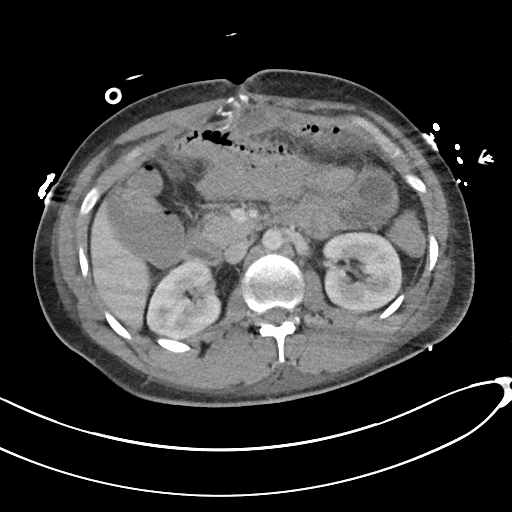
[im 75/100  soft-tissue]
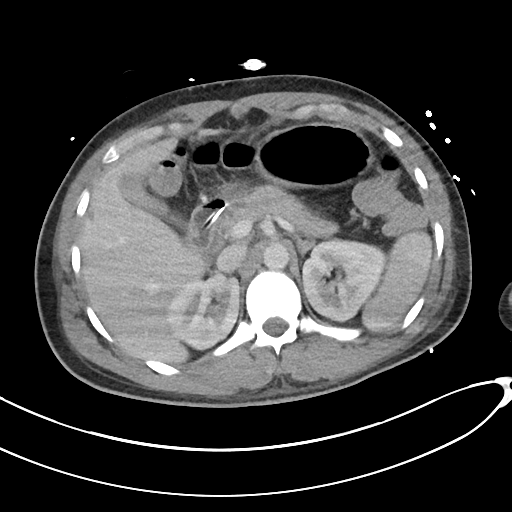
[im 75/100  bone]
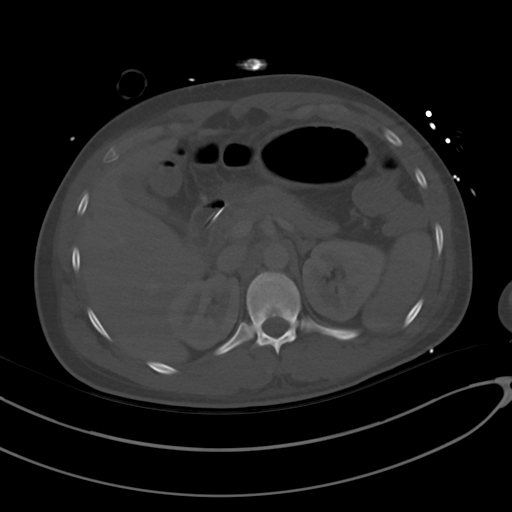
[im 83/100  soft-tissue]
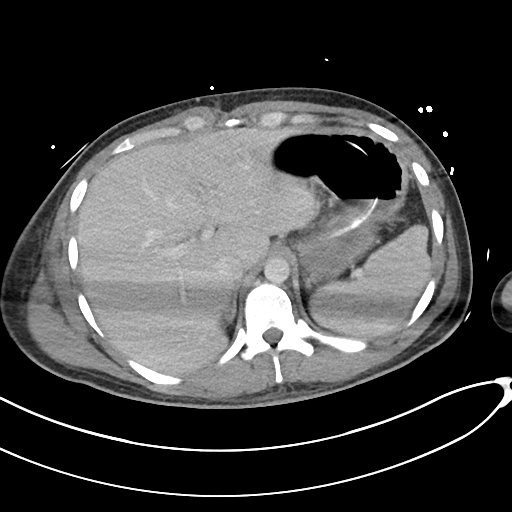
[im 91/100  soft-tissue]
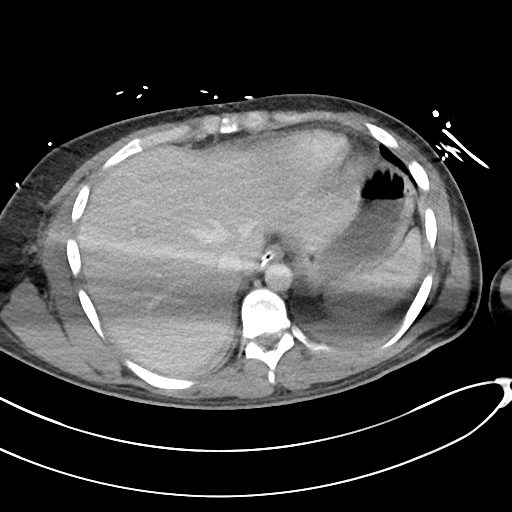

[Series 6: abdomen 3.0 mpr cor · coronal · 0.78mm/px · 3 of 91 slices shown]
[im 31/91  soft-tissue]
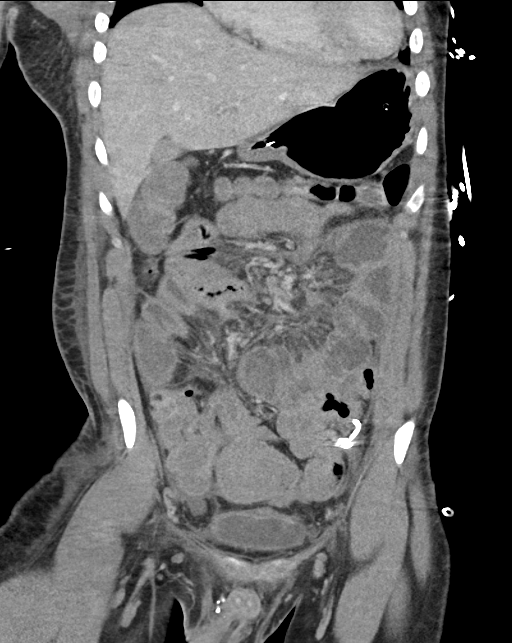
[im 41/91  soft-tissue]
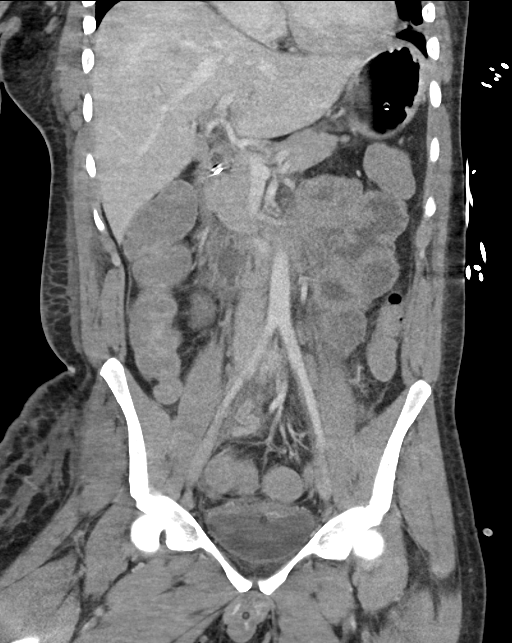
[im 51/91  soft-tissue]
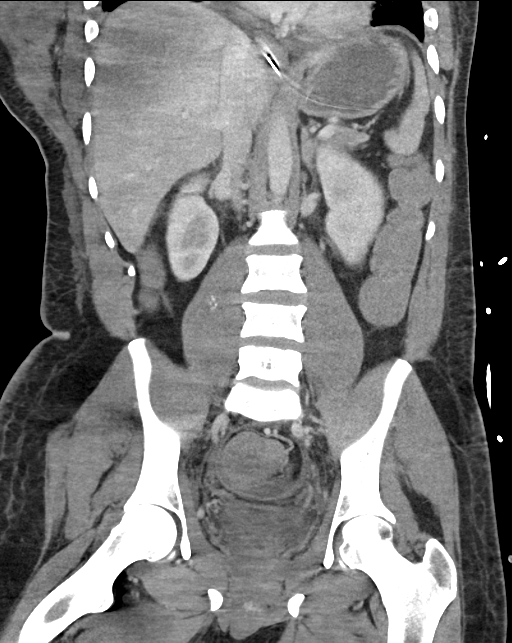

[14 of 46 positions shown; findings below may reference images not displayed]

FINDINGS: Streak artifact from the patient's arms extends across the upper
abdominal structures.

Lower chest: Mild to moderate enlargement of the cardiopericardial
silhouette. At least moderate bilateral pleural effusions with
atelectasis of the lower lobes. A nasogastric tube enters the
stomach and terminates in the duodenal bulb.

Hepatobiliary: New contracted gallbladder. No discrete liver lesion
identified.

Pancreas: Unremarkable

Spleen: Unremarkable

Adrenals/Urinary Tract: Foley catheter in the urinary bladder along
with a small amount of gas in the urinary bladder. No hydronephrosis
or hydroureter. No findings of pyelonephritis.

Stomach/Bowel: Today' s exam was performed without oral contrast
which can leads difficulties in separating loops of bowel from
adjacent abscesses. Postoperative findings in right-sided loops of
small bowel. New new new new no extraluminal gas is identified. New
in general the small bowel loops are mildly thick walled with
adjacent mesenteric edema. Fluid-filled colon, potentially from
diarrheal process.

Vascular/Lymphatic: Unremarkable

Reproductive: Unremarkable.

Other: Left lower quadrant Samona healed drainage catheter appears to
have successfully drained the associated abscess, as there is no
significant surrounding fluid collection currently.

In the pelvis in just above the vaginal cuff there is a 2.6 by
by 4.5 cm (volume = 23 cm^3) localized fluid collection with
enhancing margins in the, this previously measured 7.0 by 4.6 by
cm (volume = 150 cm^3).

In the upper abdomen just along the posterior urinary bladder there
is a 2.5 by 1.5 by 2.0 cm (volume = 3.9 cm^3) phlegmon or abscess
shown on image 34/3, previously this measured 3.6 by 7.0 by 5.8 cm
(volume = 77 cm^3).

Along the wound site which is healing by secondary intention, there
are some new linear collections of high density along the upper
omentum on images 27-43 series 3. Presumably this may represent some
type of suture material or packing/ filling material. Strictly
speaking, gossypyboma is not totally excluded but is considered less
likely given the morphology and appearance.

The other abscesses appear to have resolved. Diffuse mesenteric
edema. Presacral edema. Small amount of ascites for example along
the right paracolic gutter.

Musculoskeletal: Known right lateral rib fractures are partially
included on today' s exam.

On image 48/3, there is a 1.0 by 1.0 by 1.6 cm of rim enhancement in
the right psoas muscle just above the vertical level of the iliac
crest, with intermediate central density, this was not present on
the prior exam from 11/29/2016.
IMPRESSION: 1. Marked improvement in prior abscesses. There is no significant
residual abscess collection around the pigtail drainage catheter,
correlate with any output from the strain [REDACTED]iding whether to
remove. Most of the other abscesses have either resolved or are
dramatically smaller. The two remaining potential abscesses are a 4
cubic cm phlegmon or abscess in the upper abdomen midline just
posterior to the upper margin of the wound (this abscess was
previously 77 cubic cm), and a 23 cubic cm fluid collection just
above the posterior urinary bladder (this abscess was previously 150
cubic cm).
2. There are some new presumed postoperative findings including
irregular high density is along the upper wound site which are
presumably related to suture material or possibly dystrophic
calcification. Strictly speaking, gossypyboma can have a similar
appearance but based on the morphology is considered less likely.
Correlate with sponge counts and previous operative material used
for repair along the upper wound site.
3. New 1.0 by 1.0 by 0.6 cm focus of rim enhancement or high rim
density in the right psoas muscle, potentially a small psoas
hematoma or small abscess.
4. Diffuse small bowel wall thickening and mild mesenteric and
presacral edema. Small amount of ascites.
5. Bilateral pleural effusions with bilateral lower lobe
atelectasis.
6. Mild-to-moderate cardiomegaly, abnormal for age.
7. The enteric tube is in the duodenal bulb.
8. Right rib fractures as previously diagnosed.

## 2019-10-08 ENCOUNTER — Other Ambulatory Visit: Payer: Self-pay

## 2019-10-08 ENCOUNTER — Encounter (HOSPITAL_COMMUNITY): Payer: Self-pay | Admitting: Emergency Medicine

## 2019-10-08 ENCOUNTER — Observation Stay (HOSPITAL_COMMUNITY)
Admission: EM | Admit: 2019-10-08 | Discharge: 2019-10-09 | Disposition: A | Discharge status: Home / Self Care | Payer: No Typology Code available for payment source | Attending: General Surgery | Admitting: General Surgery

## 2019-10-08 ENCOUNTER — Observation Stay (HOSPITAL_COMMUNITY): Payer: No Typology Code available for payment source

## 2019-10-08 ENCOUNTER — Emergency Department (HOSPITAL_COMMUNITY): Payer: No Typology Code available for payment source

## 2019-10-08 DIAGNOSIS — R000 Tachycardia, unspecified: Secondary | ICD-10-CM | POA: Insufficient documentation

## 2019-10-08 DIAGNOSIS — M546 Pain in thoracic spine: Secondary | ICD-10-CM | POA: Diagnosis not present

## 2019-10-08 DIAGNOSIS — Y92410 Unspecified street and highway as the place of occurrence of the external cause: Secondary | ICD-10-CM | POA: Diagnosis not present

## 2019-10-08 DIAGNOSIS — Z87891 Personal history of nicotine dependence: Secondary | ICD-10-CM | POA: Insufficient documentation

## 2019-10-08 DIAGNOSIS — S0083XA Contusion of other part of head, initial encounter: Secondary | ICD-10-CM | POA: Diagnosis not present

## 2019-10-08 DIAGNOSIS — S36039A Unspecified laceration of spleen, initial encounter: Secondary | ICD-10-CM | POA: Diagnosis not present

## 2019-10-08 DIAGNOSIS — M79604 Pain in right leg: Secondary | ICD-10-CM | POA: Diagnosis not present

## 2019-10-08 DIAGNOSIS — S60419A Abrasion of unspecified finger, initial encounter: Secondary | ICD-10-CM

## 2019-10-08 DIAGNOSIS — S60221A Contusion of right hand, initial encounter: Secondary | ICD-10-CM | POA: Diagnosis not present

## 2019-10-08 DIAGNOSIS — M545 Low back pain, unspecified: Secondary | ICD-10-CM

## 2019-10-08 DIAGNOSIS — Z23 Encounter for immunization: Secondary | ICD-10-CM | POA: Insufficient documentation

## 2019-10-08 DIAGNOSIS — M542 Cervicalgia: Secondary | ICD-10-CM | POA: Insufficient documentation

## 2019-10-08 DIAGNOSIS — S60511A Abrasion of right hand, initial encounter: Secondary | ICD-10-CM | POA: Diagnosis not present

## 2019-10-08 DIAGNOSIS — Y999 Unspecified external cause status: Secondary | ICD-10-CM | POA: Insufficient documentation

## 2019-10-08 DIAGNOSIS — S3600XA Unspecified injury of spleen, initial encounter: Secondary | ICD-10-CM | POA: Diagnosis present

## 2019-10-08 DIAGNOSIS — Y939 Activity, unspecified: Secondary | ICD-10-CM | POA: Insufficient documentation

## 2019-10-08 DIAGNOSIS — M79605 Pain in left leg: Secondary | ICD-10-CM | POA: Insufficient documentation

## 2019-10-08 DIAGNOSIS — R4182 Altered mental status, unspecified: Secondary | ICD-10-CM | POA: Insufficient documentation

## 2019-10-08 DIAGNOSIS — T1490XA Injury, unspecified, initial encounter: Secondary | ICD-10-CM

## 2019-10-08 LAB — COMPREHENSIVE METABOLIC PANEL
ALT: 98 U/L — ABNORMAL HIGH (ref 0–44)
AST: 196 U/L — ABNORMAL HIGH (ref 15–41)
Albumin: 4.5 g/dL (ref 3.5–5.0)
Alkaline Phosphatase: 74 U/L (ref 38–126)
Anion gap: 14 (ref 5–15)
BUN: 12 mg/dL (ref 6–20)
CO2: 23 mmol/L (ref 22–32)
Calcium: 9.1 mg/dL (ref 8.9–10.3)
Chloride: 102 mmol/L (ref 98–111)
Creatinine, Ser: 0.83 mg/dL (ref 0.61–1.24)
GFR calc Af Amer: >60 mL/min (ref >60)
GFR calc non Af Amer: >60 mL/min (ref >60)
Glucose, Bld: 103 mg/dL — ABNORMAL HIGH (ref 70–99)
Potassium: 3.9 mmol/L (ref 3.5–5.1)
Sodium: 139 mmol/L (ref 135–145)
Total Bilirubin: 1.4 mg/dL — ABNORMAL HIGH (ref 0.3–1.2)
Total Protein: 8.1 g/dL (ref 6.5–8.1)

## 2019-10-08 LAB — CBC WITH DIFFERENTIAL/PLATELET
Abs Immature Granulocytes: 0.05 10*3/uL (ref 0.00–0.07)
Basophils Absolute: 0 10*3/uL (ref 0.0–0.1)
Basophils Relative: 0 %
Eosinophils Absolute: 0 10*3/uL (ref 0.0–0.5)
Eosinophils Relative: 0 %
HCT: 48.7 % (ref 39.0–52.0)
Hemoglobin: 16.2 g/dL (ref 13.0–17.0)
Immature Granulocytes: 1 %
Lymphocytes Relative: 13 %
Lymphs Abs: 1.3 10*3/uL (ref 0.7–4.0)
MCH: 32 pg (ref 26.0–34.0)
MCHC: 33.3 g/dL (ref 30.0–36.0)
MCV: 96.1 fL (ref 80.0–100.0)
Monocytes Absolute: 0.8 10*3/uL (ref 0.1–1.0)
Monocytes Relative: 8 %
Neutro Abs: 8.1 10*3/uL — ABNORMAL HIGH (ref 1.7–7.7)
Neutrophils Relative %: 78 %
Platelets: 218 10*3/uL (ref 150–400)
RBC: 5.07 MIL/uL (ref 4.22–5.81)
RDW: 14.2 % (ref 11.5–15.5)
WBC: 10.3 10*3/uL (ref 4.0–10.5)
nRBC: 0 % (ref 0.0–0.2)

## 2019-10-08 LAB — LIPASE, BLOOD: Lipase: 22 U/L (ref 11–51)

## 2019-10-08 MED ORDER — HYDROMORPHONE HCL 1 MG/ML IJ SOLN
1.0000 mg | Freq: Once | INTRAMUSCULAR | Status: AC
  Administered 2019-10-08: 1 mg via INTRAVENOUS
  Filled 2019-10-08: qty 1

## 2019-10-08 MED ORDER — ONDANSETRON 4 MG PO TBDP: 4.0000 mg | ORAL_TABLET | Freq: Four times a day (QID) | ORAL | Status: DC | PRN

## 2019-10-08 MED ORDER — HYDROCODONE-ACETAMINOPHEN 5-325 MG PO TABS
1.0000 | ORAL_TABLET | ORAL | Status: DC | PRN
  Administered 2019-10-09 (×2): 1 via ORAL
  Filled 2019-10-08 (×2): qty 1

## 2019-10-08 MED ORDER — IOHEXOL 300 MG/ML  SOLN
100.0000 mL | Freq: Once | INTRAMUSCULAR | Status: AC | PRN
  Administered 2019-10-08: 100 mL via INTRAVENOUS

## 2019-10-08 MED ORDER — ZOLPIDEM TARTRATE 5 MG PO TABS
5.0000 mg | ORAL_TABLET | Freq: Once | ORAL | Status: AC
  Administered 2019-10-08: 5 mg via ORAL
  Filled 2019-10-08: qty 1

## 2019-10-08 MED ORDER — PANTOPRAZOLE SODIUM 40 MG IV SOLR
40.0000 mg | Freq: Every day | INTRAVENOUS | Status: DC
  Administered 2019-10-08: 40 mg via INTRAVENOUS
  Filled 2019-10-08: qty 40

## 2019-10-08 MED ORDER — TETANUS-DIPHTH-ACELL PERTUSSIS 5-2.5-18.5 LF-MCG/0.5 IM SUSP
0.5000 mL | Freq: Once | INTRAMUSCULAR | Status: AC
  Administered 2019-10-08: 0.5 mL via INTRAMUSCULAR
  Filled 2019-10-08: qty 0.5

## 2019-10-08 MED ORDER — METOPROLOL TARTRATE 75 MG PO TABS: 75.0000 mg | ORAL_TABLET | Freq: Two times a day (BID) | ORAL | Status: DC

## 2019-10-08 MED ORDER — ONDANSETRON HCL 4 MG/2ML IJ SOLN
4.0000 mg | Freq: Four times a day (QID) | INTRAMUSCULAR | Status: DC | PRN
  Administered 2019-10-08: 4 mg via INTRAVENOUS
  Filled 2019-10-08: qty 2

## 2019-10-08 MED ORDER — SODIUM CHLORIDE (PF) 0.9 % IJ SOLN
INTRAMUSCULAR | Status: AC
  Filled 2019-10-08: qty 50

## 2019-10-08 MED ORDER — OXYCODONE-ACETAMINOPHEN 5-325 MG PO TABS
1.0000 | ORAL_TABLET | Freq: Once | ORAL | Status: AC
  Administered 2019-10-08: 1 via ORAL
  Filled 2019-10-08: qty 1

## 2019-10-08 MED ORDER — PANTOPRAZOLE SODIUM 40 MG PO TBEC
40.0000 mg | DELAYED_RELEASE_TABLET | Freq: Every day | ORAL | Status: DC
  Administered 2019-10-09: 40 mg via ORAL
  Filled 2019-10-08: qty 1

## 2019-10-08 MED ORDER — SODIUM CHLORIDE 0.9 % IV SOLN: INTRAVENOUS | Status: DC
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
## 2019-10-08 NOTE — ED Notes (Signed)
[facility] at bedside. Pt and all belongings transported to [facility]

## 2019-10-08 NOTE — ED Notes (Signed)
Called [facility] 
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
## 2019-10-08 NOTE — Progress Notes (Signed)
   10/08/19 2035  Vitals  Temp 98.5 F (36.9 C)  Temp Source Oral  BP (!) 147/77  MAP (mmHg) 96  BP Location Right Arm  BP Method Automatic  Patient Position (if appropriate) Lying  Pulse Rate 62  Pulse Rate Source Dinamap  Resp 18  Level of Consciousness  Level of Consciousness Alert  MEWS COLOR  MEWS Score Color Green  Oxygen Therapy  SpO2 99 %  O2 Device Room Air  Pain Assessment  Pain Scale 0-10  Pain Score 0  MEWS Score  MEWS Temp 0  MEWS Systolic 0  MEWS Pulse 0  MEWS RR 0  MEWS LOC 0  MEWS Score 0  Received patient in [facility] from [place], patient alert orientedx4 with vital signs stable shown above. Skin intact with just small abrasions on right hand. Oriented to room and bed controls, given and educated use of incentive spirometer (reaches up to 1250), SCDs in place and situated in bed with call bell at reach, will continue to monitor patient with remainder of shift.

## 2019-10-09 LAB — CBC
HCT: 45 % (ref 39.0–52.0)
Hemoglobin: 14.5 g/dL (ref 13.0–17.0)
MCH: 30.5 pg (ref 26.0–34.0)
MCHC: 32.2 g/dL (ref 30.0–36.0)
MCV: 94.7 fL (ref 80.0–100.0)
Platelets: 186 10*3/uL (ref 150–400)
RBC: 4.75 MIL/uL (ref 4.22–5.81)
RDW: 14 % (ref 11.5–15.5)
WBC: 7.6 10*3/uL (ref 4.0–10.5)
nRBC: 0 % (ref 0.0–0.2)

## 2019-10-09 MED ORDER — ENOXAPARIN SODIUM 30 MG/0.3ML ~~LOC~~ SOLN: 30.0000 mg | Freq: Two times a day (BID) | SUBCUTANEOUS | Status: DC

## 2019-10-09 MED ORDER — HYDROCODONE-ACETAMINOPHEN 5-325 MG PO TABS: 1.0000 | ORAL_TABLET | Freq: Four times a day (QID) | ORAL | 0 refills | Status: DC | PRN

## 2019-10-09 NOTE — Progress Notes (Signed)
Mattia O'Brian Dinkins to be D/C'd  per MD order. Discussed with the patient and all questions fully answered.  VSS, Skin clean, dry and intact without evidence of skin break down, no evidence of skin tears noted.  IV catheter discontinued intact. Site without signs and symptoms of complications. Dressing and pressure applied.  An After Visit Summary was printed and given to the patient. Patient received prescription.  D/c education completed with patient/family including follow up instructions, medication list, d/c activities limitations if indicated, with other d/c instructions as indicated by MD - patient able to verbalize understanding, all questions fully answered.   Patient instructed to return to ED, call 911, or call MD for any changes in condition.   Patient to be escorted via WC, and D/C home via private auto.

## 2019-10-09 NOTE — Discharge Instructions (Signed)
Splenic Injury  A splenic injury is an injury of the spleen. The spleen is an organ located in the upper left area of the abdomen, just under the ribs. The spleen filters and cleans the blood. It also stores blood cells and destroys cells that are worn out. The spleen also plays an important role in fighting disease. Splenic injuries can vary. In some cases, the spleen may only be bruised, with some bleeding inside the covering and around the spleen. Splenic injuries may also cause a deep tear or cut into the spleen (lacerated spleen). Some splenic injuries can cause the spleen to break open (rupture). What are the causes? This condition may be caused by a direct blow (trauma) to the spleen. Trauma can result from:  Car accidents.  Contact sports.  Falls.  Penetrating injuries. These can be caused by gunshot wounds or sharp objects such as a knife. What increases the risk? You may be at greater risk for a splenic injury if you have a disease that can cause the spleen to become enlarged. These include:  Alcoholic liver disease.  Viral infections, especially mononucleosis.  Certain inflammatory diseases, such as lupus.  Certain cancers, especially those that involve the lymphatic system.  Cystic fibrosis. What are the signs or symptoms? Symptoms of this condition depend on the severity of the injury. A minor injury often causes no symptoms or only minor pain in the abdomen. A major injury can result in severe bleeding, causing your blood pressure to decrease rapidly. This in turn will cause symptoms such as:  Dizziness or light-headedness.  Rapid heart rate.  Difficulty breathing.  Fainting.  Sweating with clammy skin. Other symptoms of a splenic injury may include:  Very bad abdominal pain.  Pain in the left shoulder.  Pain when the abdomen is pressed (tenderness).  Nausea.  Swelling or bruising of the abdomen. How is this diagnosed? This condition may be diagnosed  based on:  Your symptoms and medical history, especially if you were recently in an accident or you recently got hurt.  A physical exam.  Imaging tests, such as: ? CT scan. ? Ultrasound. You may have frequent blood tests for a few days after the injury to monitor your condition. How is this treated? Treatment depends on the type of splenic injury you have and how bad it is.  Less severe injuries may be treated with: ? Observation. ? Interventional radiology. This involves using flexible tubes (catheters) to stop the bleeding from inside the blood vessel.  More severe injuries may require hospitalization in the intensive care unit (ICU). While you are in the hospital: ? Your fluid and blood levels will be monitored closely. ? You will get fluids through an IV as needed. ? You may need follow-up scans to check whether your spleen is able to heal itself. If the injury is getting worse, you may need surgery. ? You may receive donated blood (transfusion). ? You may have a long needle inserted into your abdomen to remove any blood that has collected inside the spleen (hematoma).  If your blood pressure is too low, you may need emergency surgery. This may include: ? Repairing a laceration. ? Removing part of the spleen. ? Removing the entire spleen (splenectomy). Follow these instructions at home: Activity  Rest as told by your health care provider.  Avoid sitting for a long time without moving. Get up to take short walks every 1-2 hours. This is important to improve blood flow and breathing. Ask for help   if you feel weak or unsteady.  Do not participate in any activity that takes a lot of effort until your health care provider says that it is safe.  Do not take part in contact sports until your health care provider says it is safe to do so.  Do not lift anything that is heavier than 10 lb (4.5 kg), or the limit that you are told, until your health care provider says that it is  safe. General instructions  Take over-the-counter and prescription medicines only as told by your health care provider.  Stay up-to-date on vaccines as told by your health care provider.  Follow instructions from your health care provider about eating or drinking restrictions.  Do not drink alcohol.  Do not use any products that contain nicotine or tobacco, such as cigarettes and e-cigarettes. These may delay healing after an injury. If you need help quitting, ask your health care provider.  Keep all follow-up visits as told by your health care provider. This is important. Get help right away if you have:  A fever.  New or increasing pain in your abdomen or in your left shoulder.  Signs or symptoms of internal bleeding. Watch for: ? Sweating. ? Dizziness. ? Weakness. ? Cold and clammy skin. ? Fainting.  Chest pain or difficulty breathing. Summary  The spleen is an organ located in the upper left area of the abdomen, just under the ribs.  A splenic injury is an injury of the spleen. This may be caused by a direct blow (trauma) to the spleen.  You may be at greater risk for a splenic injury if you have a disease that can cause the spleen to become enlarged.  A minor injury often causes no symptoms or only minor pain in the abdomen. A major injury can result in severe bleeding, causing your blood pressure to decrease rapidly.  Treatment depends on the type of splenic injury you have and how bad it is. This information is not intended to replace advice given to you by your health care provider. Make sure you discuss any questions you have with your health care provider. Document Revised: 05/14/2017 Document Reviewed: 02/24/2017 [employer] Patient Education  2020 [employer]  
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
## 2020-07-18 ENCOUNTER — Emergency Department (HOSPITAL_COMMUNITY): Payer: Medicaid Other

## 2020-07-18 ENCOUNTER — Observation Stay (HOSPITAL_COMMUNITY)
Admission: EM | Admit: 2020-07-18 | Discharge: 2020-07-19 | Disposition: A | Discharge status: Home / Self Care | Payer: Medicaid Other | Attending: Internal Medicine | Admitting: Internal Medicine

## 2020-07-18 ENCOUNTER — Other Ambulatory Visit: Payer: Self-pay

## 2020-07-18 DIAGNOSIS — T189XXA Foreign body of alimentary tract, part unspecified, initial encounter: Secondary | ICD-10-CM | POA: Diagnosis present

## 2020-07-18 DIAGNOSIS — Z9049 Acquired absence of other specified parts of digestive tract: Secondary | ICD-10-CM

## 2020-07-18 DIAGNOSIS — X58XXXA Exposure to other specified factors, initial encounter: Secondary | ICD-10-CM | POA: Insufficient documentation

## 2020-07-18 DIAGNOSIS — Z20822 Contact with and (suspected) exposure to covid-19: Secondary | ICD-10-CM | POA: Diagnosis not present

## 2020-07-18 DIAGNOSIS — Z87891 Personal history of nicotine dependence: Secondary | ICD-10-CM | POA: Diagnosis not present

## 2020-07-18 DIAGNOSIS — T50904A Poisoning by unspecified drugs, medicaments and biological substances, undetermined, initial encounter: Secondary | ICD-10-CM

## 2020-07-18 DIAGNOSIS — Z9889 Other specified postprocedural states: Secondary | ICD-10-CM

## 2020-07-18 DIAGNOSIS — T402X4A Poisoning by other opioids, undetermined, initial encounter: Principal | ICD-10-CM | POA: Insufficient documentation

## 2020-07-18 DIAGNOSIS — Y900 Blood alcohol level of less than 20 mg/100 ml: Secondary | ICD-10-CM | POA: Insufficient documentation

## 2020-07-18 DIAGNOSIS — R109 Unspecified abdominal pain: Secondary | ICD-10-CM | POA: Diagnosis present

## 2020-07-18 LAB — COMPREHENSIVE METABOLIC PANEL
ALT: 39 U/L (ref 0–44)
AST: 41 U/L (ref 15–41)
Albumin: 4.3 g/dL (ref 3.5–5.0)
Alkaline Phosphatase: 76 U/L (ref 38–126)
Anion gap: 8 (ref 5–15)
BUN: 10 mg/dL (ref 6–20)
CO2: 27 mmol/L (ref 22–32)
Calcium: 9.2 mg/dL (ref 8.9–10.3)
Chloride: 107 mmol/L (ref 98–111)
Creatinine, Ser: 1.06 mg/dL (ref 0.61–1.24)
GFR, Estimated: >60 mL/min (ref >60)
Glucose, Bld: 107 mg/dL — ABNORMAL HIGH (ref 70–99)
Potassium: 3.7 mmol/L (ref 3.5–5.1)
Sodium: 142 mmol/L (ref 135–145)
Total Bilirubin: 0.6 mg/dL (ref 0.3–1.2)
Total Protein: 7.6 g/dL (ref 6.5–8.1)

## 2020-07-18 LAB — CBC
HCT: 50.1 % (ref 39.0–52.0)
Hemoglobin: 16.6 g/dL (ref 13.0–17.0)
MCH: 32.4 pg (ref 26.0–34.0)
MCHC: 33.1 g/dL (ref 30.0–36.0)
MCV: 97.9 fL (ref 80.0–100.0)
Platelets: 207 10*3/uL (ref 150–400)
RBC: 5.12 MIL/uL (ref 4.22–5.81)
RDW: 13.2 % (ref 11.5–15.5)
WBC: 6.2 10*3/uL (ref 4.0–10.5)
nRBC: 0 % (ref 0.0–0.2)

## 2020-07-18 LAB — ETHANOL: Alcohol, Ethyl (B): <10 mg/dL (ref <10)

## 2020-07-18 LAB — ACETAMINOPHEN LEVEL
Acetaminophen (Tylenol), Serum: <10 ug/mL — ABNORMAL LOW (ref 10–30)
Acetaminophen (Tylenol), Serum: <10 ug/mL — ABNORMAL LOW (ref 10–30)

## 2020-07-18 LAB — SALICYLATE LEVEL: Salicylate Lvl: <7 mg/dL — ABNORMAL LOW (ref 7.0–30.0)

## 2020-07-18 LAB — LIPASE, BLOOD: Lipase: 28 U/L (ref 11–51)

## 2020-07-18 MED ORDER — ONDANSETRON HCL 4 MG/2ML IJ SOLN
4.0000 mg | Freq: Once | INTRAMUSCULAR | Status: AC
  Administered 2020-07-18: 4 mg via INTRAVENOUS
  Filled 2020-07-18: qty 2

## 2020-07-18 MED ORDER — NALOXONE HCL 0.4 MG/ML IJ SOLN: 0.4000 mg | INTRAMUSCULAR | Status: DC | PRN

## 2020-07-18 MED ORDER — SODIUM CHLORIDE 0.9% FLUSH
3.0000 mL | Freq: Two times a day (BID) | INTRAVENOUS | Status: DC
  Administered 2020-07-19: 3 mL via INTRAVENOUS

## 2020-07-18 MED ORDER — LACTATED RINGERS IV SOLN: INTRAVENOUS | Status: DC

## 2020-07-18 MED ORDER — SODIUM CHLORIDE 0.9 % IV BOLUS
1000.0000 mL | Freq: Once | INTRAVENOUS | Status: AC
  Administered 2020-07-18: 1000 mL via INTRAVENOUS

## 2020-07-18 MED ORDER — FAMOTIDINE IN NACL 20-0.9 MG/50ML-% IV SOLN
20.0000 mg | Freq: Once | INTRAVENOUS | Status: AC
  Administered 2020-07-18: 20 mg via INTRAVENOUS
  Filled 2020-07-18: qty 50
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
## 2020-07-18 NOTE — ED Notes (Signed)
Pt remains aware of need for urine, had BM in bathroom but stated "I couldn't pee". Labeled cup remains at bedside

## 2020-07-18 NOTE — ED Provider Notes (Signed)
Emergency Medicine Provider Triage Evaluation Note  Chase Ayala , a 30 y.o. male  was evaluated in triage.  Pt complains of swallowing a punch of pills.  (Pt tells me he does not know what he took) Pt told Tech he took 28 oxycontin this morning    Review of Systems  Positive: Abdominal pain Negative: No cough or fever  Physical Exam  BP (!) 147/102 (BP Location: Right Arm)   Pulse 89   Temp 98.3 F (36.8 C) (Oral)   Resp 16   SpO2 100%  Gen:   Awake, no distress   Resp:  Normal effort  MSK:   Moves extremities without difficulty  Other:  Sleepy   Medical Decision Making  Medically screening exam initiated at 2:51 PM.  Appropriate orders placed.  Chase Ayala was informed that the remainder of the evaluation will be completed by another provider, this initial triage assessment does not replace that evaluation, and the importance of remaining in the ED until their evaluation is complete.    Osie Cheeks 07/18/20 1454    Benjiman Core, MD 07/18/20 [phone]

## 2020-07-18 NOTE — ED Triage Notes (Signed)
Pt reports swallowing a pill of unknown origin last night with his friends and now is nauseas with abdominal pain. Reports his friends told him to come here to get his stomach pumped.

## 2020-07-18 NOTE — ED Notes (Signed)
Denise from poison control called to confirm patient condition. Reports that she can't close the case at this time.

## 2020-07-18 NOTE — ED Notes (Signed)
Patient transported to X-ray 

## 2020-07-18 NOTE — ED Provider Notes (Signed)
[facility] Provider Note   CSN: [id number] Arrival date & time: 07/18/20  1417    History Chief Complaint  Patient presents with  . Abdominal Pain  . Nausea    Chase Ayala is a 30 y.o. 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  States approximately 1 hour PTA he took #15 blue pills which he believes were Roxicodone.  These were not prescribed to him.  States they were his friends.  He denies any intent at self-harm.  Patient states he just wanted to "numb my arm pain."  States he has some generalized abdominal cramping.  He feels nauseous however has not being able to vomit.  He denies headache, lightness, dizziness, sleepiness, dysuria, diarrhea, constipation.  Denies any SI, HI, AVH.  Does state he drank "a bunch" of alcohol earlier today as well. No HA, lightheadedness, chest pain, shortness of breath, dysuria, hematuria, weakness.  Triage note patient initially said he took 15 OxyContin yesterday evening?  Patient is very vague and it does not seem that he knows exactly what he took.  Mother in room states patient "self medicates" due to his chronic issues.  She denies [facility].  Denies additional aggravating or alleviating factors  History obtained from patient, family in room and past medical records.  No interpreter used.   Poison control Recommends initial and repeat Tylenol in 4 hours, repeat EKG in 4 hours.  Observe for 6 hours.  If at baseline he may DC home.  Possible #15 Roxicodone 1 hr PTA unsure dose "blue pill"??    HPI     Past Medical History:  Diagnosis Date  . 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 Medical history non-contributory   . PONV (postoperative nausea and vomiting)   . Right scapula fracture 11/09/2016    Patient Active Problem List    Diagnosis Date Noted  . Spleen laceration 10/08/2019  . 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 Pressure injury of skin 11/30/2016  . 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 Right scapula fracture 11/09/2016  . Open skull fracture (HCC) 11/05/2016    Past Surgical History:  Procedure Laterality Date  . APPLICATION OF WOUND VAC N/A 11/18/2016   Procedure: APPLICATION OF WOUND VAC;  Surgeon: Jimmye NormanWyatt, James, MD;  [facility];  Service: General;  Laterality: N/A;  . BOWEL RESECTION N/A 11/18/2016   Procedure: SMALL BOWEL RESECTION;  Surgeon: Jimmye NormanWyatt, James, MD;  [facility];  Service: General;  Laterality: N/A;  . BOWEL RESECTION N/A 11/20/2016   Procedure: SMALL BOWEL RESECTION;  Surgeon: Violeta Gelinashompson, Burke, MD;  [facility];  Service: General;  Laterality: N/A;  . LAPAROTOMY N/A 11/18/2016   Procedure: EXPLORATORY LAPAROTOMY;  Surgeon: Jimmye NormanWyatt, James, MD;  [facility];  Service: General;  Laterality: N/A;  . LAPAROTOMY N/A 11/20/2016   Procedure: EXPLORATORY LAPAROTOMY;  Surgeon: Violeta Gelinashompson, Burke, MD;  [facility];  Service: General;  Laterality: N/A;  . LAPAROTOMY N/A 11/23/2016   Procedure: EXPLORATORY LAPAROTOMY, SMALL BOWEL ANASTAMOSIS AND CLOSURE;  Surgeon: Violeta Gelinashompson, Burke, MD;  [facility];  Service: General;  Laterality: N/A;  . MANDIBULAR HARDWARE REMOVAL N/A 12/31/2016   Procedure: MANDIBULAR HARDWARE REMOVAL;  Surgeon: Suzanna ObeyByers, John, MD;  [facility];  Service: ENT;  Laterality: N/A;  .  NO PAST SURGERIES    . ORIF MANDIBULAR FRACTURE N/A 11/06/2016   Procedure: OPEN REDUCTION INTERNAL FIXATION (ORIF) RIGHT TRIPOD AND MANDIBULAR FRACTURE; CLOSED REDUCTION NASAL FRACTURE;  Surgeon: Suzanna Obey, MD;  Location: [facility] OR;  Service: ENT;  Laterality: N/A;  ORIF right orbital fracture, Bilateral maxillary fracture, mandible fracture, Mandibulo-maxillary fixation, tracheostomy  . TRACHEOSTOMY TUBE PLACEMENT N/A 11/06/2016    Procedure: TRACHEOSTOMY;  Surgeon: Suzanna Obey, MD;  [facility];  Service: ENT;  Laterality: N/A;  . VACUUM ASSISTED CLOSURE CHANGE N/A 11/20/2016   Procedure: ABDOMINAL VACUUM ASSISTED CLOSURE CHANGE;  Surgeon: Violeta Gelinas, MD;  [facility] OR;  Service: General;  Laterality: N/A;       No family history on file.  Social History   Tobacco Use  . Smoking status: Former Games developer  . Smokeless tobacco: Never Used  Substance Use Topics  . Alcohol use: Yes  . Drug use: Yes    Frequency: 5.0 times per week    Types: Marijuana, Cocaine, Benzodiazepines    Home Medications Prior to Admission medications   Medication Sig Start Date End Date Taking? Authorizing Provider  HYDROcodone-acetaminophen (NORCO/VICODIN) 5-325 MG tablet Take 1 tablet by mouth every 6 (six) hours as needed (breakthrough pain). Patient not taking: No sig reported 10/09/19   Jacinto Halim, PA-C    Allergies    Patient has no known allergies.  Review of Systems   Review of Systems  Constitutional: Negative.   HENT: Negative.   Respiratory: Negative.   Cardiovascular: Negative.   Gastrointestinal: Positive for abdominal pain, nausea and vomiting. Negative for constipation, diarrhea and rectal pain.  Genitourinary: Negative.   Musculoskeletal: Negative.   Neurological: Negative.   All other systems reviewed and are negative.   Physical Exam Updated Vital Signs BP (!) 144/99   Pulse 92   Temp 98.3 F (36.8 C) (Oral)   Resp 19   SpO2 100%   Physical Exam Vitals and nursing note reviewed.  Constitutional:      General: He is not in acute distress.    Appearance: He is well-developed. He is not ill-appearing, toxic-appearing or diaphoretic.  HENT:     Head: Normocephalic and atraumatic.     Mouth/Throat:     Mouth: Mucous membranes are moist.  Eyes:     Extraocular Movements: Extraocular movements intact.     Conjunctiva/sclera: Conjunctivae normal.     Pupils: Pupils are equal, round,  and reactive to light.  Cardiovascular:     Rate and Rhythm: Normal rate and regular rhythm.     Heart sounds: Normal heart sounds.  Pulmonary:     Effort: Pulmonary effort is normal. No respiratory distress.     Breath sounds: Normal breath sounds.  Abdominal:     General: Bowel sounds are normal. There is no distension.     Palpations: Abdomen is soft.     Tenderness: There is generalized abdominal tenderness. There is no right CVA tenderness, left CVA tenderness, guarding or rebound.     Hernia: No hernia is present.     Comments: Old midline abdominal incision.  No obvious hernias.  Some generalized tenderness however no focal pain.  Musculoskeletal:        General: Normal range of motion.     Cervical back: Normal range of motion and neck supple.  Skin:    General: Skin is warm and dry.     Capillary Refill: Capillary refill takes less than 2 seconds.  Neurological:  General: No focal deficit present.     Mental Status: He is alert.  Psychiatric:     Comments: Denies SI, HI, AVH.     ED Results / Procedures / Treatments   Labs (all labs ordered are listed, but only abnormal results are displayed) Labs Reviewed  COMPREHENSIVE METABOLIC PANEL - Abnormal; Notable for the following components:      Result Value   Glucose, Bld 107 (*)    All other components within normal limits  SALICYLATE LEVEL - Abnormal; Notable for the following components:   Salicylate Lvl <7.0 (*)    All other components within normal limits  ACETAMINOPHEN LEVEL - Abnormal; Notable for the following components:   Acetaminophen (Tylenol), Serum <10 (*)    All other components within normal limits  LIPASE, BLOOD  CBC  ETHANOL  URINALYSIS, ROUTINE W REFLEX MICROSCOPIC  RAPID URINE DRUG SCREEN, HOSP PERFORMED  ACETAMINOPHEN LEVEL    EKG EKG Interpretation  Date/Time:  Thursday Jul 18 2020 16:07:55 EDT Ventricular Rate:  77 PR Interval:  146 QRS Duration: 95 QT Interval:  344 QTC  Calculation: 390 R Axis:   15 Text Interpretation: Sinus rhythm ST elev, probable normal early repol pattern Confirmed by Benjiman Core [phone]) on 07/18/2020 4:54:14 PM   Radiology DG Abdomen Acute W/Chest  Result Date: 07/18/2020 CLINICAL DATA:  Abdominal pain. EXAM: DG ABDOMEN ACUTE WITH 1 VIEW CHEST COMPARISON:  None. FINDINGS: There is no evidence of dilated bowel loops or free intraperitoneal air. No radiopaque calculi or other significant radiographic abnormality is seen. Heart size and mediastinal contours are within normal limits. Both lungs are clear. IMPRESSION: No evidence of bowel obstruction.  No acute cardiopulmonary disease. Electronically Signed   By: Caprice Renshaw   On: 07/18/2020 16:35    Procedures Procedures   Medications Ordered in ED Medications  ondansetron [facility]) injection 4 mg (has no administration in time range)  sodium chloride 0.9 % bolus 1,000 mL (1,000 mLs Intravenous New Bag/Given 07/18/20 1654)    ED Course  I have reviewed the triage vital signs and the nursing notes.  Pertinent labs & imaging results that were available during my care of the patient were reviewed by me and considered in my medical decision making (see chart for details).   30 year old here for evaluation of ingestion.  He is vague in what exactly is occurred and what he ingested.  Possibly Roxicodone.  He is afebrile, nonseptic, non-ill-appearing.  His heart and lungs are clear.  His abdomen has mild tenderness over no focal pain.  He is passing flatulence.  On exam he is actively sticking his fingers down his throat to produce emesis.  Discussed with poison control.  Recommend observing for 6 hours, Pete Tylenol after 4 hours from initial, repeat EKG after 4 hours.  Labs and imaging personally reviewed and interpreted:  CBC without leukocytosis metabolic panel without electrolyte, renal or liver normality Lipase 28 DG chest/Abd Wo acute abnormality Ethanol <10 Salicylate less than  7 Acetaminophen less than 10  Patient reassessed.  No drowsiness.  Has intact respiratory drive.  Have not needed Narcan.  Discussed plan with patient and mother in room.  They are agreeable.  He does feel nauseous.  We will do dose of Zofran, no prolonged QT interval on EKG.  Patient is again adamant that this was not an intent at self-harm.  Orders placed for repeat Tylenol at 4 hours  Care transferred to Dr. Donnald Garre who will follow up on remaining labs,  reassessment and determine disposition.    MDM Rules/Calculators/A&P                           Final Clinical Impression(s) / ED Diagnoses Final diagnoses:  Drug overdose, undetermined intent, initial encounter    Rx / DC Orders ED Discharge Orders    None       Minna Dumire A, PA-C 07/18/20 1845    Benjiman Core, MD 07/19/20 [phone]

## 2020-07-18 NOTE — ED Provider Notes (Signed)
Repeat acetaminophen level at 9:30, if taking PO without difficulty can D/C home. No intentional Overdose.  When I went to reassess the patient after second Tylenol level returned, patient reported he still feels nauseated and bloated and has vomited up mucousy brown tinge material.  He now reports that he swallowed a sealed baggy with about 15 pills in it.  He is still equivocal on what the pills actually were and guesses that they were Roxicodone.  When asked why he did this, he reports because somebody else was going to take them from him so he went ahead and swallowed them so neither of them could have them. Physical Exam  BP (!) 144/99   Pulse 92   Temp 98.3 F (36.8 C) (Oral)   Resp 19   SpO2 100%   Physical Exam Constitutional:      Comments: Alert and nontoxic.  Mental status clear.  No somnolence or respiratory distress  HENT:     Mouth/Throat:     Pharynx: Oropharynx is clear.  Eyes:     Extraocular Movements: Extraocular movements intact.  Pulmonary:     Effort: Pulmonary effort is normal.  Abdominal:     General: There is no distension.     Palpations: Abdomen is soft.     Tenderness: There is no abdominal tenderness. There is no guarding.  Musculoskeletal:        General: No swelling. Normal range of motion.  Skin:    General: Skin is warm and dry.  Neurological:     General: No focal deficit present.     Mental Status: He is oriented to person, place, and time.     Coordination: Coordination normal.     ED Course/Procedures     Procedures  MDM   Consult: After obtaining information regarding possible foreign body ingestion, reviewed with Dr. Marina Goodell gastroenterology.  At this time he advises proceeding with an endoscopy without a confirmed foreign body in a location would not be appropriate management.  He suggest could try CT scan to see if there is foreign body identified.  Advises if foreign body is in small bowel that would be general surgery.  Advises if  there is foreign body in the colon cathartics could be used.  We will proceed with CT scan and observation.  At this time based on patient's report of foreign body ingestion with a bag of pills, there remains risk of adverse effect.  Patient is currently stable with normal vital signs.  We will continue to monitor and try to ascertain if there is a foreign body present.      Arby Barrette, MD 07/18/20 2234

## 2020-07-19 DIAGNOSIS — T189XXA Foreign body of alimentary tract, part unspecified, initial encounter: Secondary | ICD-10-CM | POA: Diagnosis not present

## 2020-07-19 LAB — URINALYSIS, ROUTINE W REFLEX MICROSCOPIC
Bacteria, UA: NONE SEEN
Bilirubin Urine: NEGATIVE
Glucose, UA: NEGATIVE mg/dL
Ketones, ur: 20 mg/dL — AB
Leukocytes,Ua: NEGATIVE
Nitrite: NEGATIVE
Protein, ur: 30 mg/dL — AB
Specific Gravity, Urine: 1.018 (ref 1.005–1.030)
pH: 6 (ref 5.0–8.0)

## 2020-07-19 LAB — CBC
HCT: 45.1 % (ref 39.0–52.0)
Hemoglobin: 14.6 g/dL (ref 13.0–17.0)
MCH: 32.2 pg (ref 26.0–34.0)
MCHC: 32.4 g/dL (ref 30.0–36.0)
MCV: 99.3 fL (ref 80.0–100.0)
Platelets: 163 10*3/uL (ref 150–400)
RBC: 4.54 MIL/uL (ref 4.22–5.81)
RDW: 13.4 % (ref 11.5–15.5)
WBC: 5.6 10*3/uL (ref 4.0–10.5)
nRBC: 0 % (ref 0.0–0.2)

## 2020-07-19 LAB — RESP PANEL BY RT-PCR (FLU A&B, COVID) ARPGX2
Influenza A by PCR: NEGATIVE
Influenza B by PCR: NEGATIVE
SARS Coronavirus 2 by RT PCR: NEGATIVE

## 2020-07-19 LAB — COMPREHENSIVE METABOLIC PANEL
ALT: 33 U/L (ref 0–44)
AST: 33 U/L (ref 15–41)
Albumin: 3.4 g/dL — ABNORMAL LOW (ref 3.5–5.0)
Alkaline Phosphatase: 60 U/L (ref 38–126)
Anion gap: 7 (ref 5–15)
BUN: 9 mg/dL (ref 6–20)
CO2: 26 mmol/L (ref 22–32)
Calcium: 8.7 mg/dL — ABNORMAL LOW (ref 8.9–10.3)
Chloride: 104 mmol/L (ref 98–111)
Creatinine, Ser: 0.85 mg/dL (ref 0.61–1.24)
GFR, Estimated: >60 mL/min (ref >60)
Glucose, Bld: 131 mg/dL — ABNORMAL HIGH (ref 70–99)
Potassium: 3.7 mmol/L (ref 3.5–5.1)
Sodium: 137 mmol/L (ref 135–145)
Total Bilirubin: 0.8 mg/dL (ref 0.3–1.2)
Total Protein: 6 g/dL — ABNORMAL LOW (ref 6.5–8.1)

## 2020-07-19 LAB — RAPID URINE DRUG SCREEN, HOSP PERFORMED
Amphetamines: NOT DETECTED
Barbiturates: NOT DETECTED
Benzodiazepines: NOT DETECTED
Cocaine: NOT DETECTED
Opiates: NOT DETECTED
Tetrahydrocannabinol: POSITIVE — AB

## 2020-07-19 LAB — HIV ANTIBODY (ROUTINE TESTING W REFLEX): HIV Screen 4th Generation wRfx: NONREACTIVE

## 2020-07-19 MED ORDER — MAGNESIUM CITRATE PO SOLN
1.0000 | Freq: Once | ORAL | Status: AC
  Administered 2020-07-19: 1 via ORAL
  Filled 2020-07-19: qty 296

## 2020-07-19 NOTE — ED Notes (Signed)
Patient is ready to leave and wants to see the doctor. Patient is alert, ambulatory, and ate his breakfast without issues. Doctor Hanley Ben states that he will not be by to see this patient for a few hours and can sign out AMA if he wants to leave.

## 2020-07-19 NOTE — ED Notes (Signed)
Patient signed out AMA, patient aware of risks and benefits.

## 2020-07-19 NOTE — Discharge Summary (Signed)
Physician Discharge Summary  Chase Ayala FYB:[id number] DOB: [birth year] DOA: 07/18/2020  PCP: Pcp, No  Admit date: 07/18/2020 Discharge date: 07/19/2020  Time spent: <30 minutes  Patient left AMA prior to being seen on day of discharge.  Recommendations for Outpatient Follow-up:  1. Swallowed foreign body - Follow-up with PCP within 1 to 2 days especially if foreign body fails to pass - Monitor for symptoms of opioid toxicity as possibility of swallowed baggy containing opioids could rupture and lead to this.  Seek medical attention if this occurs.  Discharge Diagnoses:  Principal Problem:   Foreign body ingestion, initial encounter Active Problems:   History of laparotomy   History of bowel resection  Discharge Condition: Stable  Diet recommendation: Regular, if tolerating diet  There were no vitals filed for this visit.  History of present illness:  As per H&P from yesterday patient presented after swallowing a baggy containing about 15 pills he believes to be Roxicodone.  He denies intentional harm himself and states that he swallowed pills accidentally when was around with his friends.  He had only some nausea and abdominal pain.  Hospital Course:  He was monitored overnight for any signs of opioid toxicity in case back he ruptured.  His vital signs remained stable overnight.  He was able to tolerate breakfast per chart review.  I saw the patient on admission but he was not seen on the day of discharge as the Rounding provider he was assigned to was unable to see him before he decided to leave AMA.  Per chart review nursing informed patient of risks and benefits of leaving AMA versus staying.  Consultations:  Case was discussed with GI over the phone by EDP who stated that endoscopy would have little benefit without a confirmed location and would not be appropriate management.  They recommended a CT scan which did not identify a foreign body.  He also advises that the  body was in the small bowel and general surgery will be needed and it feels like: Cathartics could be used.  Discharge Exam: Vitals:   07/19/20 0645 07/19/20 0925  BP: 127/82 124/80  Pulse: (!) 58 69  Resp: 16 18  Temp:  98.6 F (37 C)  SpO2: 97% 98%   Patient not examined on day of discharge as he left AMA before he could be seen by the rounding physician.  Discharge Instructions  - Monitor for passage of reported swallowed foreign body.  Follow-up with PCP in the next 1 to 2 days, especially foreign body does not pass.  - Monitor for drowsiness, decreased respiratory rate, decreased level of consciousness or other signs of opioid toxicity.  Seek medical attention if this occurs.  Allergies as of 07/19/2020   No Known Allergies     Medication List    ASK your doctor about these medications   HYDROcodone-acetaminophen 5-325 MG tablet Commonly known as: NORCO/VICODIN Take 1 tablet by mouth every 6 (six) hours as needed (breakthrough pain).      No Known Allergies    The results of significant diagnostics from this hospitalization (including imaging, microbiology, ancillary and laboratory) are listed below for reference.    Significant Diagnostic Studies: CT Abdomen Pelvis Wo Contrast  Result Date: 07/18/2020 CLINICAL DATA:  Foreign body ingestion, nausea, unspecified abdominal pain. EXAM: CT ABDOMEN AND PELVIS WITHOUT CONTRAST TECHNIQUE: Multidetector CT imaging of the abdomen and pelvis was performed following the standard protocol without IV contrast. COMPARISON:  10/08/2019 FINDINGS: Lower chest: No acute abnormality.  Hepatobiliary: Moderate to severe hepatic steatosis. No enhancing intrahepatic mass identified on this noncontrast examination. No intra or extrahepatic biliary ductal dilation. Gallbladder unremarkable. Pancreas: Unremarkable Spleen: Unremarkable Adrenals/Urinary Tract: Adrenal glands are unremarkable. Kidneys are normal, without renal calculi, focal lesion, or  hydronephrosis. Bladder is unremarkable. Stomach/Bowel: Partial small bowel resection has been performed. The stomach, small bowel, and large bowel are otherwise unremarkable. No radiopaque intra-abdominal foreign body identified. No evidence of obstruction or focal inflammation. The appendix is normal. No free intraperitoneal gas or fluid. Vascular/Lymphatic: No significant vascular findings are present. No enlarged abdominal or pelvic lymph nodes. Reproductive: Prostate is unremarkable. Other: There is diastasis of the rectus abdominus musculature with a small superimposed fat containing supraumbilical ventral hernia. The rectum is unremarkable. Musculoskeletal: No acute bone abnormality. No lytic or blastic bone lesion. IMPRESSION: No acute intra-abdominal pathology identified. No intra-abdominal radiopaque foreign body. No definite radiographic explanation for the patient's reported symptoms. Moderate to severe hepatic steatosis. Electronically Signed   By: Helyn Numbers MD   On: 07/18/2020 22:51   DG Abdomen Acute W/Chest  Result Date: 07/18/2020 CLINICAL DATA:  Abdominal pain. EXAM: DG ABDOMEN ACUTE WITH 1 VIEW CHEST COMPARISON:  None. FINDINGS: There is no evidence of dilated bowel loops or free intraperitoneal air. No radiopaque calculi or other significant radiographic abnormality is seen. Heart size and mediastinal contours are within normal limits. Both lungs are clear. IMPRESSION: No evidence of bowel obstruction.  No acute cardiopulmonary disease. Electronically Signed   By: Caprice Renshaw   On: 07/18/2020 16:35    Microbiology: Recent Results (from the past 240 hour(s))  Resp Panel by RT-PCR (Flu A&B, Covid) Nasopharyngeal Swab     Status: None   Collection Time: 07/18/20 10:01 PM   Specimen: Nasopharyngeal Swab; Nasopharyngeal(NP) swabs in vial transport medium  Result Value Ref Range Status   SARS Coronavirus 2 by RT PCR NEGATIVE NEGATIVE Final    Comment: (NOTE) SARS-CoV-2 target nucleic  acids are NOT DETECTED.  The SARS-CoV-2 RNA is generally detectable in upper respiratory specimens during the acute phase of infection. The lowest concentration of SARS-CoV-2 viral copies this assay can detect is 138 copies/mL. A negative result does not preclude SARS-Cov-2 infection and should not be used as the sole basis for treatment or other patient management decisions. A negative result may occur with  improper specimen collection/handling, submission of specimen other than nasopharyngeal swab, presence of viral mutation(s) within the areas targeted by this assay, and inadequate number of viral copies(<138 copies/mL). A negative result must be combined with clinical observations, patient history, and epidemiological information. The expected result is Negative.  Fact Sheet for Patients:  BloggerCourse.com  Fact Sheet for Healthcare Providers:  SeriousBroker.it  This test is no t yet approved or cleared by the [place] FDA and  has been authorized for detection and/or diagnosis of SARS-CoV-2 by FDA under an Emergency Use Authorization (EUA). This EUA will remain  in effect (meaning this test can be used) for the duration of the COVID-19 declaration under Section 564(b)(1) of the Act, 21 U.S.C.section 360bbb-3(b)(1), unless the authorization is terminated  or revoked sooner.       Influenza A by PCR NEGATIVE NEGATIVE Final   Influenza B by PCR NEGATIVE NEGATIVE Final    Comment: (NOTE) The Xpert Xpress SARS-CoV-2/FLU/RSV plus assay is intended as an aid in the diagnosis of influenza from Nasopharyngeal swab specimens and should not be used as a sole basis for treatment. Nasal washings and aspirates are unacceptable  for Xpert Xpress SARS-CoV-2/FLU/RSV testing.  Fact Sheet for Patients: BloggerCourse.com  Fact Sheet for Healthcare Providers: SeriousBroker.it  This  test is not yet approved or cleared by the [place] FDA and has been authorized for detection and/or diagnosis of SARS-CoV-2 by FDA under an Emergency Use Authorization (EUA). This EUA will remain in effect (meaning this test can be used) for the duration of the COVID-19 declaration under Section 564(b)(1) of the Act, 21 U.S.C. section 360bbb-3(b)(1), unless the authorization is terminated or revoked.  Performed at [facility], [address], [place], [place] [address]      Labs: Basic Metabolic Panel: Recent Labs  Lab 07/18/20 1449 07/19/20 0211  NA 142 137  K 3.7 3.7  CL 107 104  CO2 27 26  GLUCOSE 107* 131*  BUN 10 9  CREATININE 1.06 0.85  CALCIUM 9.2 8.7*   Liver Function Tests: Recent Labs  Lab 07/18/20 1449 07/19/20 0211  AST 41 33  ALT 39 33  ALKPHOS 76 60  BILITOT 0.6 0.8  PROT 7.6 6.0*  ALBUMIN 4.3 3.4*   Recent Labs  Lab 07/18/20 1449  LIPASE 28   No results for input(s): AMMONIA in the last 168 hours. CBC: Recent Labs  Lab 07/18/20 1449 07/19/20 0211  WBC 6.2 5.6  HGB 16.6 14.6  HCT 50.1 45.1  MCV 97.9 99.3  PLT 207 163   Cardiac Enzymes: No results for input(s): CKTOTAL, CKMB, CKMBINDEX, TROPONINI in the last 168 hours. BNP: BNP (last 3 results) No results for input(s): BNP in the last 8760 hours.  ProBNP (last 3 results) No results for input(s): PROBNP in the last 8760 hours.  CBG: No results for input(s): GLUCAP in the last 168 hours.   Signed:  Synetta Fail MD.  [facility] 07/19/2020, 6:13 PM

## 2023-02-17 ENCOUNTER — Encounter (HOSPITAL_COMMUNITY): Payer: Self-pay

## 2023-02-17 ENCOUNTER — Other Ambulatory Visit: Payer: Self-pay

## 2023-02-17 ENCOUNTER — Emergency Department (HOSPITAL_COMMUNITY)
Admission: EM | Admit: 2023-02-17 | Discharge: 2023-02-18 | Disposition: A | Discharge status: Home / Self Care | Payer: Medicaid Other | Attending: Emergency Medicine | Admitting: Emergency Medicine

## 2023-02-17 DIAGNOSIS — R1032 Left lower quadrant pain: Secondary | ICD-10-CM | POA: Diagnosis present

## 2023-02-17 LAB — CBC WITH DIFFERENTIAL/PLATELET
Abs Immature Granulocytes: 0.05 10*3/uL (ref 0.00–0.07)
Basophils Absolute: 0 10*3/uL (ref 0.0–0.1)
Basophils Relative: 0 %
Eosinophils Absolute: 0.2 10*3/uL (ref 0.0–0.5)
Eosinophils Relative: 2 %
HCT: 48.7 % (ref 39.0–52.0)
Hemoglobin: 15.8 g/dL (ref 13.0–17.0)
Immature Granulocytes: 1 %
Lymphocytes Relative: 34 %
Lymphs Abs: 2.4 10*3/uL (ref 0.7–4.0)
MCH: 31.8 pg (ref 26.0–34.0)
MCHC: 32.4 g/dL (ref 30.0–36.0)
MCV: 98 fL (ref 80.0–100.0)
Monocytes Absolute: 0.5 10*3/uL (ref 0.1–1.0)
Monocytes Relative: 7 %
Neutro Abs: 3.8 10*3/uL (ref 1.7–7.7)
Neutrophils Relative %: 56 %
Platelets: 212 10*3/uL (ref 150–400)
RBC: 4.97 MIL/uL (ref 4.22–5.81)
RDW: 14.1 % (ref 11.5–15.5)
WBC: 7 10*3/uL (ref 4.0–10.5)
nRBC: 0 % (ref 0.0–0.2)

## 2023-02-17 LAB — URINALYSIS, ROUTINE W REFLEX MICROSCOPIC
Bacteria, UA: NONE SEEN
Bilirubin Urine: NEGATIVE
Glucose, UA: NEGATIVE mg/dL
Hgb urine dipstick: NEGATIVE
Ketones, ur: NEGATIVE mg/dL
Leukocytes,Ua: NEGATIVE
Nitrite: NEGATIVE
Protein, ur: 30 mg/dL — AB
Specific Gravity, Urine: 1.017 (ref 1.005–1.030)
pH: 5 (ref 5.0–8.0)

## 2023-02-17 LAB — COMPREHENSIVE METABOLIC PANEL
ALT: 28 U/L (ref 0–44)
AST: 29 U/L (ref 15–41)
Albumin: 3.7 g/dL (ref 3.5–5.0)
Alkaline Phosphatase: 55 U/L (ref 38–126)
Anion gap: 7 (ref 5–15)
BUN: 11 mg/dL (ref 6–20)
CO2: 27 mmol/L (ref 22–32)
Calcium: 9 mg/dL (ref 8.9–10.3)
Chloride: 107 mmol/L (ref 98–111)
Creatinine, Ser: 0.95 mg/dL (ref 0.61–1.24)
GFR, Estimated: >60 mL/min (ref >60)
Glucose, Bld: 93 mg/dL (ref 70–99)
Potassium: 4.5 mmol/L (ref 3.5–5.1)
Sodium: 141 mmol/L (ref 135–145)
Total Bilirubin: 0.6 mg/dL (ref <1.2)
Total Protein: 6.9 g/dL (ref 6.5–8.1)

## 2023-02-17 LAB — LIPASE, BLOOD: Lipase: 32 U/L (ref 11–51)

## 2023-02-17 NOTE — ED Triage Notes (Signed)
Patient c/o multiple GI problems, constipation, bloating, hemorrhoids, and lower abdominal pain. Patient also reports hx of having 25ft of his intestines removed previously as well. Last BM this morning but still felt like he needed to have another BM.

## 2023-02-17 NOTE — ED Provider Triage Note (Signed)
Emergency Medicine Provider Triage Evaluation Note  Chase Ayala , a 32 y.o. male  was evaluated in triage.  Pt complains of abdominal pain.  Symptoms intermittent over the past year.  Mostly in the left lower quadrant.  States she is more gassy than normal occasionally.  Had a normal bowel movement today.  Reports occasional blood on the toilet paper after bowel movements.  Does have a history of hemorrhoids.  No fevers or chills, nausea or vomiting, urinary complaints.  Review of Systems  Positive: As above Negative: As above  Physical Exam  BP 137/85   Pulse 71   Temp 98 F (36.7 C)   Resp 18   SpO2 99%  Gen:   Awake, no distress   Resp:  Normal effort  MSK:   Moves extremities without difficulty  Other:  Large abdominal surgical scar  Medical Decision Making  Medically screening exam initiated at 2:09 PM.  Appropriate orders placed.  Ayven Shiquan Rapisarda was informed that the remainder of the evaluation will be completed by another provider, this initial triage assessment does not replace that evaluation, and the importance of remaining in the ED until their evaluation is complete.  Workup initiated   Michelle Piper, Cordelia Poche 02/17/23 1410

## 2023-02-18 ENCOUNTER — Emergency Department (HOSPITAL_COMMUNITY): Payer: Medicaid Other

## 2023-02-18 MED ORDER — IOHEXOL 350 MG/ML SOLN
75.0000 mL | Freq: Once | INTRAVENOUS | Status: AC | PRN
  Administered 2023-02-18: 75 mL via INTRAVENOUS

## 2023-02-18 MED ORDER — DICYCLOMINE HCL 20 MG PO TABS: 20.0000 mg | ORAL_TABLET | Freq: Two times a day (BID) | ORAL | 0 refills | Status: DC
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
## 2023-02-18 NOTE — Discharge Instructions (Addendum)
Was a pleasure taking care of you today.  You are seen for abdominal pain has been going on for the past year.  He had normal blood work and urinalysis there was a small amount of protein in the urine.  No signs of infection.  Your CT scan was normal.  We are sending you home with medication to help with any cramping and you to follow-up with primary care and/or gastroenterology.  Come back if you have worsening pain, vomiting, fevers or other worrisome changes.

## 2023-07-02 ENCOUNTER — Inpatient Hospital Stay (HOSPITAL_COMMUNITY)

## 2023-07-02 ENCOUNTER — Encounter (HOSPITAL_COMMUNITY): Payer: Self-pay

## 2023-07-02 ENCOUNTER — Emergency Department (HOSPITAL_COMMUNITY)

## 2023-07-02 ENCOUNTER — Other Ambulatory Visit: Payer: Self-pay

## 2023-07-02 ENCOUNTER — Encounter (HOSPITAL_COMMUNITY)
Admission: EM | Disposition: A | Discharge status: Home / Self Care | Payer: Self-pay | Source: Home / Self Care | Attending: Orthopedic Surgery

## 2023-07-02 ENCOUNTER — Inpatient Hospital Stay (HOSPITAL_COMMUNITY)
Admission: EM | Admit: 2023-07-02 | Discharge: 2023-07-06 | DRG: 494 | Disposition: A | Discharge status: Home / Self Care | Attending: Orthopedic Surgery | Admitting: Orthopedic Surgery

## 2023-07-02 DIAGNOSIS — S82101B Unspecified fracture of upper end of right tibia, initial encounter for open fracture type I or II: Secondary | ICD-10-CM | POA: Diagnosis not present

## 2023-07-02 DIAGNOSIS — S82401B Unspecified fracture of shaft of right fibula, initial encounter for open fracture type I or II: Secondary | ICD-10-CM | POA: Diagnosis not present

## 2023-07-02 DIAGNOSIS — S82201B Unspecified fracture of shaft of right tibia, initial encounter for open fracture type I or II: Principal | ICD-10-CM | POA: Diagnosis present

## 2023-07-02 DIAGNOSIS — Y92410 Unspecified street and highway as the place of occurrence of the external cause: Secondary | ICD-10-CM

## 2023-07-02 DIAGNOSIS — Z23 Encounter for immunization: Secondary | ICD-10-CM | POA: Diagnosis not present

## 2023-07-02 DIAGNOSIS — Z79899 Other long term (current) drug therapy: Secondary | ICD-10-CM | POA: Diagnosis not present

## 2023-07-02 DIAGNOSIS — R531 Weakness: Secondary | ICD-10-CM

## 2023-07-02 DIAGNOSIS — F1290 Cannabis use, unspecified, uncomplicated: Secondary | ICD-10-CM | POA: Diagnosis present

## 2023-07-02 DIAGNOSIS — F17210 Nicotine dependence, cigarettes, uncomplicated: Secondary | ICD-10-CM | POA: Diagnosis present

## 2023-07-02 HISTORY — PX: IRRIGATION AND DEBRIDEMENT POSTERIOR HIP: SHX7265

## 2023-07-02 HISTORY — PX: TIBIA IM NAIL INSERTION: SHX2516

## 2023-07-02 LAB — BASIC METABOLIC PANEL WITH GFR
Anion gap: 11 (ref 5–15)
BUN: 6 mg/dL (ref 6–20)
CO2: 24 mmol/L (ref 22–32)
Calcium: 9.3 mg/dL (ref 8.9–10.3)
Chloride: 107 mmol/L (ref 98–111)
Creatinine, Ser: 1.11 mg/dL (ref 0.61–1.24)
GFR, Estimated: >60 mL/min (ref >60)
Glucose, Bld: 130 mg/dL — ABNORMAL HIGH (ref 70–99)
Potassium: 3.9 mmol/L (ref 3.5–5.1)
Sodium: 142 mmol/L (ref 135–145)

## 2023-07-02 LAB — CBC WITH DIFFERENTIAL/PLATELET
Abs Immature Granulocytes: 0.04 10*3/uL (ref 0.00–0.07)
Basophils Absolute: 0 10*3/uL (ref 0.0–0.1)
Basophils Relative: 0 %
Eosinophils Absolute: 0.1 10*3/uL (ref 0.0–0.5)
Eosinophils Relative: 2 %
HCT: 50.3 % (ref 39.0–52.0)
Hemoglobin: 16.5 g/dL (ref 13.0–17.0)
Immature Granulocytes: 1 %
Lymphocytes Relative: 42 %
Lymphs Abs: 3 10*3/uL (ref 0.7–4.0)
MCH: 31.7 pg (ref 26.0–34.0)
MCHC: 32.8 g/dL (ref 30.0–36.0)
MCV: 96.7 fL (ref 80.0–100.0)
Monocytes Absolute: 0.6 10*3/uL (ref 0.1–1.0)
Monocytes Relative: 8 %
Neutro Abs: 3.4 10*3/uL (ref 1.7–7.7)
Neutrophils Relative %: 47 %
Platelets: 268 10*3/uL (ref 150–400)
RBC: 5.2 MIL/uL (ref 4.22–5.81)
RDW: 14 % (ref 11.5–15.5)
WBC: 7.2 10*3/uL (ref 4.0–10.5)
nRBC: 0 % (ref 0.0–0.2)

## 2023-07-02 LAB — TYPE AND SCREEN
ABO/RH(D): O POS
Antibody Screen: NEGATIVE

## 2023-07-02 LAB — I-STAT CHEM 8, ED
BUN: 7 mg/dL (ref 6–20)
Calcium, Ion: 1.01 mmol/L — ABNORMAL LOW (ref 1.15–1.40)
Chloride: 111 mmol/L (ref 98–111)
Creatinine, Ser: 0.9 mg/dL (ref 0.61–1.24)
Glucose, Bld: 109 mg/dL — ABNORMAL HIGH (ref 70–99)
HCT: 46 % (ref 39.0–52.0)
Hemoglobin: 15.6 g/dL (ref 13.0–17.0)
Potassium: 3.1 mmol/L — ABNORMAL LOW (ref 3.5–5.1)
Sodium: 146 mmol/L — ABNORMAL HIGH (ref 135–145)
TCO2: 20 mmol/L — ABNORMAL LOW (ref 22–32)

## 2023-07-02 LAB — HIV ANTIBODY (ROUTINE TESTING W REFLEX): HIV Screen 4th Generation wRfx: NONREACTIVE

## 2023-07-02 LAB — PROTIME-INR
INR: 1 (ref 0.8–1.2)
Prothrombin Time: 13.7 s (ref 11.4–15.2)

## 2023-07-02 SURGERY — INSERTION, INTRAMEDULLARY ROD, TIBIA
Anesthesia: General | Site: Leg Lower | Laterality: Right

## 2023-07-02 MED ORDER — ACETAMINOPHEN 500 MG PO TABS
1000.0000 mg | ORAL_TABLET | Freq: Three times a day (TID) | ORAL | Status: DC
  Administered 2023-07-03 – 2023-07-06 (×11): 1000 mg via ORAL
  Filled 2023-07-02 (×12): qty 2

## 2023-07-02 MED ORDER — ALBUMIN HUMAN 5 % IV SOLN: INTRAVENOUS | Status: DC | PRN

## 2023-07-02 MED ORDER — ORAL CARE MOUTH RINSE: 15.0000 mL | Freq: Once | OROMUCOSAL | Status: DC

## 2023-07-02 MED ORDER — MIDAZOLAM HCL 2 MG/2ML IJ SOLN
INTRAMUSCULAR | Status: DC | PRN
  Administered 2023-07-02: 2 mg via INTRAVENOUS

## 2023-07-02 MED ORDER — FENTANYL CITRATE (PF) 100 MCG/2ML IJ SOLN
INTRAMUSCULAR | Status: AC
  Filled 2023-07-02: qty 2

## 2023-07-02 MED ORDER — HYDROMORPHONE HCL 1 MG/ML IJ SOLN
INTRAMUSCULAR | Status: AC
  Filled 2023-07-02: qty 0.5

## 2023-07-02 MED ORDER — ACETAMINOPHEN 10 MG/ML IV SOLN
INTRAVENOUS | Status: DC | PRN
Start: 2023-07-02 — End: 2023-07-02
  Administered 2023-07-02: 1000 mg via INTRAVENOUS

## 2023-07-02 MED ORDER — TRANEXAMIC ACID-NACL 1000-0.7 MG/100ML-% IV SOLN
INTRAVENOUS | Status: DC | PRN
  Administered 2023-07-02: 1000 mg via INTRAVENOUS

## 2023-07-02 MED ORDER — HYDROMORPHONE HCL 1 MG/ML IJ SOLN
INTRAMUSCULAR | Status: DC | PRN
  Administered 2023-07-02 (×2): .5 mg via INTRAVENOUS

## 2023-07-02 MED ORDER — AMISULPRIDE (ANTIEMETIC) 5 MG/2ML IV SOLN: 10.0000 mg | Freq: Once | INTRAVENOUS | Status: DC | PRN

## 2023-07-02 MED ORDER — IOHEXOL 350 MG/ML SOLN
75.0000 mL | Freq: Once | INTRAVENOUS | Status: AC | PRN
  Administered 2023-07-02: 75 mL via INTRAVENOUS

## 2023-07-02 MED ORDER — KETAMINE HCL 50 MG/5ML IJ SOSY
PREFILLED_SYRINGE | INTRAMUSCULAR | Status: AC
  Filled 2023-07-02: qty 5

## 2023-07-02 MED ORDER — VANCOMYCIN HCL 1000 MG IV SOLR
INTRAVENOUS | Status: AC
  Filled 2023-07-02: qty 20

## 2023-07-02 MED ORDER — SODIUM CHLORIDE 0.9 % IV SOLN: 12.5000 mg | INTRAVENOUS | Status: DC | PRN

## 2023-07-02 MED ORDER — HYDROMORPHONE HCL 1 MG/ML IJ SOLN
1.0000 mg | Freq: Once | INTRAMUSCULAR | Status: AC
  Administered 2023-07-02: 1 mg via INTRAVENOUS
  Filled 2023-07-02: qty 1

## 2023-07-02 MED ORDER — FENTANYL CITRATE (PF) 100 MCG/2ML IJ SOLN
25.0000 ug | INTRAMUSCULAR | Status: DC | PRN
  Administered 2023-07-02: 25 ug via INTRAVENOUS

## 2023-07-02 MED ORDER — SODIUM CHLORIDE 0.9 % IV BOLUS
1000.0000 mL | Freq: Once | INTRAVENOUS | Status: AC
  Administered 2023-07-02: 1000 mL via INTRAVENOUS

## 2023-07-02 MED ORDER — PHENYLEPHRINE 80 MCG/ML (10ML) SYRINGE FOR IV PUSH (FOR BLOOD PRESSURE SUPPORT)
PREFILLED_SYRINGE | INTRAVENOUS | Status: DC | PRN
  Administered 2023-07-02: 120 ug via INTRAVENOUS
  Administered 2023-07-02: 80 ug via INTRAVENOUS

## 2023-07-02 MED ORDER — SUCCINYLCHOLINE CHLORIDE 200 MG/10ML IV SOSY
PREFILLED_SYRINGE | INTRAVENOUS | Status: DC | PRN
  Administered 2023-07-02: 120 mg via INTRAVENOUS

## 2023-07-02 MED ORDER — TETANUS-DIPHTH-ACELL PERTUSSIS 5-2.5-18.5 LF-MCG/0.5 IM SUSY
0.5000 mL | PREFILLED_SYRINGE | Freq: Once | INTRAMUSCULAR | Status: AC
  Administered 2023-07-02: 0.5 mL via INTRAMUSCULAR
  Filled 2023-07-02: qty 0.5

## 2023-07-02 MED ORDER — CEFAZOLIN SODIUM-DEXTROSE 2-3 GM-%(50ML) IV SOLR
INTRAVENOUS | Status: DC | PRN
  Administered 2023-07-02: 2 g via INTRAVENOUS

## 2023-07-02 MED ORDER — ACETAMINOPHEN 10 MG/ML IV SOLN
INTRAVENOUS | Status: AC
  Filled 2023-07-02: qty 100

## 2023-07-02 MED ORDER — ONDANSETRON HCL 4 MG/2ML IJ SOLN: 4.0000 mg | Freq: Four times a day (QID) | INTRAMUSCULAR | Status: DC | PRN

## 2023-07-02 MED ORDER — DEXAMETHASONE SODIUM PHOSPHATE 10 MG/ML IJ SOLN
INTRAMUSCULAR | Status: DC | PRN
  Administered 2023-07-02: 10 mg via INTRAVENOUS

## 2023-07-02 MED ORDER — KETAMINE HCL 10 MG/ML IJ SOLN
INTRAMUSCULAR | Status: DC | PRN
Start: 2023-07-02 — End: 2023-07-02
  Administered 2023-07-02: 20 mg via INTRAVENOUS
  Administered 2023-07-02: 30 mg via INTRAVENOUS

## 2023-07-02 MED ORDER — TRANEXAMIC ACID-NACL 1000-0.7 MG/100ML-% IV SOLN
INTRAVENOUS | Status: AC
  Filled 2023-07-02: qty 100

## 2023-07-02 MED ORDER — 0.9 % SODIUM CHLORIDE (POUR BTL) OPTIME
TOPICAL | Status: DC | PRN
  Administered 2023-07-02: 1000 mL

## 2023-07-02 MED ORDER — OXYCODONE HCL 5 MG PO TABS
5.0000 mg | ORAL_TABLET | ORAL | Status: DC | PRN
  Administered 2023-07-02 – 2023-07-05 (×9): 10 mg via ORAL
  Filled 2023-07-02 (×9): qty 2

## 2023-07-02 MED ORDER — OXYCODONE HCL 5 MG PO TABS: 5.0000 mg | ORAL_TABLET | Freq: Once | ORAL | Status: DC | PRN

## 2023-07-02 MED ORDER — PROPOFOL 10 MG/ML IV BOLUS
INTRAVENOUS | Status: AC
  Filled 2023-07-02: qty 20

## 2023-07-02 MED ORDER — SUGAMMADEX SODIUM 200 MG/2ML IV SOLN
INTRAVENOUS | Status: DC | PRN
  Administered 2023-07-02 (×4): 50 mg via INTRAVENOUS

## 2023-07-02 MED ORDER — ONDANSETRON HCL 4 MG PO TABS: 4.0000 mg | ORAL_TABLET | Freq: Four times a day (QID) | ORAL | Status: DC | PRN

## 2023-07-02 MED ORDER — FENTANYL CITRATE (PF) 250 MCG/5ML IJ SOLN
INTRAMUSCULAR | Status: AC
  Filled 2023-07-02: qty 5

## 2023-07-02 MED ORDER — ROCURONIUM BROMIDE 10 MG/ML (PF) SYRINGE
PREFILLED_SYRINGE | INTRAVENOUS | Status: DC | PRN
  Administered 2023-07-02: 20 mg via INTRAVENOUS
  Administered 2023-07-02: 60 mg via INTRAVENOUS

## 2023-07-02 MED ORDER — CHLORHEXIDINE GLUCONATE 0.12 % MT SOLN: 15.0000 mL | Freq: Once | OROMUCOSAL | Status: DC

## 2023-07-02 MED ORDER — CHLORHEXIDINE GLUCONATE 0.12 % MT SOLN
OROMUCOSAL | Status: AC
  Filled 2023-07-02: qty 15

## 2023-07-02 MED ORDER — SODIUM CHLORIDE 0.9 % IR SOLN
Status: DC | PRN
  Administered 2023-07-02: 3000 mL

## 2023-07-02 MED ORDER — TRANEXAMIC ACID-NACL 1000-0.7 MG/100ML-% IV SOLN: 1000.0000 mg | INTRAVENOUS | Status: DC

## 2023-07-02 MED ORDER — FENTANYL CITRATE PF 50 MCG/ML IJ SOSY
PREFILLED_SYRINGE | INTRAMUSCULAR | Status: AC
  Administered 2023-07-02: 100 ug via INTRAVENOUS
  Filled 2023-07-02: qty 2

## 2023-07-02 MED ORDER — MIDAZOLAM HCL 2 MG/2ML IJ SOLN
INTRAMUSCULAR | Status: AC
  Filled 2023-07-02: qty 2

## 2023-07-02 MED ORDER — TRANEXAMIC ACID-NACL 1000-0.7 MG/100ML-% IV SOLN
1000.0000 mg | Freq: Once | INTRAVENOUS | Status: AC
  Administered 2023-07-02: 1000 mg via INTRAVENOUS
  Filled 2023-07-02: qty 100

## 2023-07-02 MED ORDER — LACTATED RINGERS IV SOLN: INTRAVENOUS | Status: AC

## 2023-07-02 MED ORDER — VANCOMYCIN HCL 1000 MG IV SOLR
INTRAVENOUS | Status: DC | PRN
Start: 2023-07-02 — End: 2023-07-02
  Administered 2023-07-02: 1000 mg via TOPICAL

## 2023-07-02 MED ORDER — HYDROMORPHONE HCL 1 MG/ML IJ SOLN
0.5000 mg | INTRAMUSCULAR | Status: DC | PRN
  Administered 2023-07-03 – 2023-07-05 (×10): 0.5 mg via INTRAVENOUS
  Filled 2023-07-02 (×10): qty 0.5

## 2023-07-02 MED ORDER — ASPIRIN 81 MG PO CHEW
81.0000 mg | CHEWABLE_TABLET | Freq: Two times a day (BID) | ORAL | Status: DC
  Administered 2023-07-03 – 2023-07-05 (×6): 81 mg via ORAL
  Filled 2023-07-02 (×6): qty 1

## 2023-07-02 MED ORDER — PROPOFOL 10 MG/ML IV BOLUS
INTRAVENOUS | Status: AC
Start: 2023-07-02 — End: ?
  Filled 2023-07-02: qty 20

## 2023-07-02 MED ORDER — METHOCARBAMOL 500 MG PO TABS
500.0000 mg | ORAL_TABLET | Freq: Four times a day (QID) | ORAL | Status: DC
  Administered 2023-07-02 – 2023-07-05 (×13): 500 mg via ORAL
  Filled 2023-07-02 (×13): qty 1

## 2023-07-02 MED ORDER — LIDOCAINE 2% (20 MG/ML) 5 ML SYRINGE
INTRAMUSCULAR | Status: DC | PRN
  Administered 2023-07-02: 60 mg via INTRAVENOUS

## 2023-07-02 MED ORDER — ALBUTEROL SULFATE HFA 108 (90 BASE) MCG/ACT IN AERS
INHALATION_SPRAY | RESPIRATORY_TRACT | Status: DC | PRN
  Administered 2023-07-02: 4 via RESPIRATORY_TRACT

## 2023-07-02 MED ORDER — DEXMEDETOMIDINE HCL IN NACL 80 MCG/20ML IV SOLN
INTRAVENOUS | Status: DC | PRN
  Administered 2023-07-02: 20 ug via INTRAVENOUS

## 2023-07-02 MED ORDER — FENTANYL CITRATE (PF) 250 MCG/5ML IJ SOLN
INTRAMUSCULAR | Status: DC | PRN
Start: 2023-07-02 — End: 2023-07-02
  Administered 2023-07-02: 100 ug via INTRAVENOUS
  Administered 2023-07-02 (×3): 50 ug via INTRAVENOUS

## 2023-07-02 MED ORDER — POLYETHYLENE GLYCOL 3350 17 G PO PACK
17.0000 g | PACK | Freq: Every day | ORAL | Status: DC
  Administered 2023-07-02 – 2023-07-05 (×4): 17 g via ORAL
  Filled 2023-07-02 (×4): qty 1

## 2023-07-02 MED ORDER — OXYCODONE HCL 5 MG/5ML PO SOLN: 5.0000 mg | Freq: Once | ORAL | Status: DC | PRN

## 2023-07-02 MED ORDER — PROPOFOL 10 MG/ML IV BOLUS
INTRAVENOUS | Status: DC | PRN
  Administered 2023-07-02: 200 mg via INTRAVENOUS

## 2023-07-02 MED ORDER — CEFAZOLIN SODIUM-DEXTROSE 2-4 GM/100ML-% IV SOLN: 2.0000 g | INTRAVENOUS | Status: DC

## 2023-07-02 MED ORDER — FENTANYL CITRATE PF 50 MCG/ML IJ SOSY
50.0000 ug | PREFILLED_SYRINGE | Freq: Once | INTRAMUSCULAR | Status: AC
  Administered 2023-07-02: 50 ug via INTRAVENOUS
  Filled 2023-07-02: qty 1

## 2023-07-02 MED ORDER — ONDANSETRON HCL 4 MG/2ML IJ SOLN
INTRAMUSCULAR | Status: DC | PRN
  Administered 2023-07-02: 4 mg via INTRAVENOUS

## 2023-07-02 MED ORDER — FENTANYL CITRATE PF 50 MCG/ML IJ SOSY: 100.0000 ug | PREFILLED_SYRINGE | Freq: Once | INTRAMUSCULAR | Status: AC

## 2023-07-02 MED ORDER — SENNOSIDES-DOCUSATE SODIUM 8.6-50 MG PO TABS
1.0000 | ORAL_TABLET | Freq: Two times a day (BID) | ORAL | Status: DC
  Administered 2023-07-02 – 2023-07-05 (×7): 1 via ORAL
  Filled 2023-07-02 (×7): qty 1

## 2023-07-02 MED ORDER — CEFAZOLIN SODIUM-DEXTROSE 2-4 GM/100ML-% IV SOLN
2.0000 g | Freq: Once | INTRAVENOUS | Status: AC
  Administered 2023-07-02: 2 g via INTRAVENOUS
  Filled 2023-07-02: qty 100

## 2023-07-02 MED ORDER — CEFAZOLIN SODIUM-DEXTROSE 2-4 GM/100ML-% IV SOLN
2.0000 g | Freq: Four times a day (QID) | INTRAVENOUS | Status: AC
  Administered 2023-07-03 (×3): 2 g via INTRAVENOUS
  Filled 2023-07-02 (×3): qty 100

## 2023-07-02 SURGICAL SUPPLY — 59 items
BAG COUNTER SPONGE SURGICOUNT (BAG) ×1 IMPLANT
BIT DRILL LONG 4.0 (BIT) IMPLANT
BIT DRILL SHORT 4.0 (BIT) IMPLANT
BLADE SURG 10 STRL SS (BLADE) ×2 IMPLANT
BNDG COHESIVE 4X5 TAN STRL LF (GAUZE/BANDAGES/DRESSINGS) ×1 IMPLANT
BNDG ELASTIC 4X5.8 VLCR STR LF (GAUZE/BANDAGES/DRESSINGS) ×1 IMPLANT
BNDG ELASTIC 6INX 5YD STR LF (GAUZE/BANDAGES/DRESSINGS) ×1 IMPLANT
BNDG GAUZE DERMACEA FLUFF 4 (GAUZE/BANDAGES/DRESSINGS) ×1 IMPLANT
BRUSH SCRUB EZ PLAIN DRY (MISCELLANEOUS) ×2 IMPLANT
CHLORAPREP W/TINT 26 (MISCELLANEOUS) ×1 IMPLANT
COVER SURGICAL LIGHT HANDLE (MISCELLANEOUS) ×2 IMPLANT
DRAPE C-ARM 42X72 X-RAY (DRAPES) ×1 IMPLANT
DRAPE C-ARMOR (DRAPES) ×1 IMPLANT
DRAPE HALF SHEET 40X57 (DRAPES) ×2 IMPLANT
DRAPE IMP U-DRAPE 54X76 (DRAPES) ×2 IMPLANT
DRAPE INCISE IOBAN 66X45 STRL (DRAPES) IMPLANT
DRAPE SURG ORHT 6 SPLT 77X108 (DRAPES) ×2 IMPLANT
DRAPE U-SHAPE 47X51 STRL (DRAPES) ×1 IMPLANT
DRILL BIT LONG 4.0 (BIT) ×2 IMPLANT
DRSG ADAPTIC 3X8 NADH LF (GAUZE/BANDAGES/DRESSINGS) ×1 IMPLANT
DRSG TEGADERM 4X4.75 (GAUZE/BANDAGES/DRESSINGS) IMPLANT
DRSG XEROFORM 1X8 (GAUZE/BANDAGES/DRESSINGS) IMPLANT
ELECT REM PT RETURN 9FT ADLT (ELECTROSURGICAL) ×1 IMPLANT
ELECTRODE REM PT RTRN 9FT ADLT (ELECTROSURGICAL) ×1 IMPLANT
GAUZE SPONGE 4X4 12PLY STRL (GAUZE/BANDAGES/DRESSINGS) ×1 IMPLANT
GAUZE SPONGE 4X4 12PLY STRL LF (GAUZE/BANDAGES/DRESSINGS) IMPLANT
GLOVE BIO SURGEON STRL SZ 6.5 (GLOVE) ×3 IMPLANT
GLOVE BIO SURGEON STRL SZ7.5 (GLOVE) ×4 IMPLANT
GLOVE BIOGEL PI IND STRL 6.5 (GLOVE) ×1 IMPLANT
GLOVE BIOGEL PI IND STRL 7.5 (GLOVE) ×1 IMPLANT
GOWN STRL REUS W/ TWL LRG LVL3 (GOWN DISPOSABLE) ×2 IMPLANT
GOWN STRL SURGICAL XL XLNG (GOWN DISPOSABLE) IMPLANT
GUIDE PIN 3.2X343 (PIN) ×1 IMPLANT
GUIDE ROD 3.0 (MISCELLANEOUS) ×1 IMPLANT
KIT BASIN OR (CUSTOM PROCEDURE TRAY) ×1 IMPLANT
KIT TURNOVER KIT B (KITS) ×1 IMPLANT
NAIL TIBIAL 10X38 (Nail) IMPLANT
PACK TOTAL JOINT (CUSTOM PROCEDURE TRAY) ×1 IMPLANT
PAD ARMBOARD POSITIONER FOAM (MISCELLANEOUS) ×2 IMPLANT
PIN GUIDE 3.2X343MM (PIN) IMPLANT
ROD GUIDE 3.0 (MISCELLANEOUS) IMPLANT
SCREW TRIGEN LOW PROF 5.0X30 (Screw) IMPLANT
SCREW TRIGEN LOW PROF 5.0X35 (Screw) IMPLANT
SCREW TRIGEN LOW PROF 5.0X42.5 (Screw) IMPLANT
SCREW TRIGEN LOW PROF 5.0X57.5 (Screw) IMPLANT
SCREW TRIGEN LOW PROF 5.0X70 (Screw) IMPLANT
SET CYSTO W/LG BORE CLAMP LF (SET/KITS/TRAYS/PACK) IMPLANT
STAPLER SKIN PROX WIDE 3.9 (STAPLE) IMPLANT
STAPLER VISISTAT 35W (STAPLE) ×1 IMPLANT
SUT ETHILON 2 0 FS 18 (SUTURE) IMPLANT
SUT MNCRL AB 3-0 PS2 18 (SUTURE) ×1 IMPLANT
SUT PDS AB 2-0 CT2 27 (SUTURE) IMPLANT
SUT VIC AB 0 CT1 18XCR BRD8 (SUTURE) IMPLANT
SUT VIC AB 0 CT1 27XBRD ANBCTR (SUTURE) IMPLANT
SUT VIC AB 2-0 CT1 TAPERPNT 27 (SUTURE) IMPLANT
SUT VIC AB 2-0 CT2 18 VCP726D (SUTURE) IMPLANT
TOWEL GREEN STERILE (TOWEL DISPOSABLE) ×2 IMPLANT
TOWEL GREEN STERILE FF (TOWEL DISPOSABLE) ×1 IMPLANT
YANKAUER SUCT BULB TIP NO VENT (SUCTIONS) IMPLANT
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
## 2023-07-02 NOTE — ED Notes (Signed)
 Patient transported to CT

## 2023-07-02 NOTE — ED Notes (Signed)
 Ortho and DO at bedside for splint application.

## 2023-07-02 NOTE — Anesthesia Postprocedure Evaluation (Signed)
 Anesthesia Post Note  Patient: Chase Ayala  Procedure(s) Performed: INSERTION, INTRAMEDULLARY ROD, RIGHT TIBIA (Right: Leg Lower) IRRIGATION AND DEBRIDEMENT, OPEN FRACTURE;  TIBIA (Right) (Right: Leg Lower)     Patient location during evaluation: PACU Anesthesia Type: General Level of consciousness: awake and alert Pain management: pain level controlled Vital Signs Assessment: post-procedure vital signs reviewed and stable Respiratory status: spontaneous breathing, nonlabored ventilation, respiratory function stable and patient connected to nasal cannula oxygen Cardiovascular status: blood pressure returned to baseline and stable Postop Assessment: no apparent nausea or vomiting Anesthetic complications: no   No notable events documented.  Last Vitals:  Vitals:   07/02/23 2100 07/02/23 2143  BP: (!) 118/104 (!) 149/101  Pulse: 91 83  Resp: 17 18  Temp: 36.6 C 37.1 C  SpO2: 95% 95%    Last Pain:  Vitals:   07/02/23 2143  TempSrc: Oral  PainSc:                  Thom JONELLE Peoples

## 2023-07-02 NOTE — ED Notes (Signed)
Ortho tech contacted for splint application

## 2023-07-02 NOTE — ED Notes (Signed)
 Number for pt to call [facility] when discharged, [phone].

## 2023-07-02 NOTE — Transfer of Care (Signed)
 Immediate Anesthesia Transfer of Care Note  Patient: Chase Ayala  Procedure(s) Performed: INSERTION, INTRAMEDULLARY ROD, TIBIA (Right) IRRIGATION AND DEBRIDEMENT, OPEN FRACTURE (Leg Lower)  Patient Location: PACU  Anesthesia Type:General  Level of Consciousness: awake, alert , and oriented  Airway & Oxygen Therapy: Patient Spontanous Breathing  Post-op Assessment: Report given to RN and Post -op Vital signs reviewed and stable  Post vital signs: Reviewed and stable  Last Vitals:  Vitals Value Taken Time  BP 142/106 07/02/23 2015  Temp    Pulse 95 07/02/23 2016  Resp 20 07/02/23 2016  SpO2 96 % 07/02/23 2016  Vitals shown include unfiled device data.  Last Pain:  Vitals:   07/02/23 1658  TempSrc: Oral  PainSc: 10-Worst pain ever         Complications: No notable events documented.

## 2023-07-02 NOTE — Anesthesia Procedure Notes (Signed)
 Procedure Name: Intubation Date/Time: 07/02/2023 5:39 PM  Performed by: Chanda Combes, CRNAPre-anesthesia Checklist: Patient identified, Emergency Drugs available, Suction available and Patient being monitored Patient Re-evaluated:Patient Re-evaluated prior to induction Oxygen Delivery Method: Circle System Utilized Preoxygenation: Pre-oxygenation with 100% oxygen Induction Type: IV induction Laryngoscope Size: Miller and 2 Grade View: Grade I Tube type: Oral Tube size: 7.5 mm Number of attempts: 1 Airway Equipment and Method: Stylet and Oral airway Placement Confirmation: ETT inserted through vocal cords under direct vision, positive ETCO2 and breath sounds checked- equal and bilateral Secured at: 25 cm Tube secured with: Tape Dental Injury: Teeth and Oropharynx as per pre-operative assessment

## 2023-07-02 NOTE — Progress Notes (Signed)
 Orthopedic Tech Progress Note Patient Details:  Chase Ayala June 02, 1990 [id number]  Ortho Devices Type of Ortho Device: Short leg splint, Stirrup splint Ortho Device/Splint Location: Wrapped open wound on LE with bandage and gauze Ortho Device/Splint Interventions: Ordered, Application, Adjustment   Post Interventions Patient Tolerated: Well  Herbie Loll 07/02/2023, 3:51 PM
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
## 2023-07-02 NOTE — Brief Op Note (Signed)
 07/02/2023  8:10 PM  PATIENT:  Chase Ayala  33 y.o. male  PRE-OPERATIVE DIAGNOSIS:  RIGHT OPEN TIBIA FRACTURE  POST-OPERATIVE DIAGNOSIS:  RIGHT OPEN TIBIA FRACTURE  PROCEDURE:  Procedure(s): INSERTION, INTRAMEDULLARY ROD, TIBIA (Right) IRRIGATION AND DEBRIDEMENT, OPEN FRACTURE  SURGEON:  Surgeons and Role:    * Diedra Fowler, MD - Primary  PHYSICIAN ASSISTANT: none  ASSISTANTS: none   ANESTHESIA:   general  EBL:  200 mL   BLOOD ADMINISTERED:none  DRAINS: none   LOCAL MEDICATIONS USED:  NONE  SPECIMEN:  No Specimen  DISPOSITION OF SPECIMEN:  N/A  COUNTS:  YES  TOURNIQUET:  NONE  DICTATION: .Note written in EPIC  PLAN OF CARE: Admit to inpatient   PATIENT DISPOSITION:  PACU - hemodynamically stable.   Delay start of Pharmacological VTE agent (>24hrs) due to surgical blood loss or risk of bleeding: no

## 2023-07-02 NOTE — ED Notes (Signed)
 Pt removed C-collar, pt educated on importance of keeping c-collar in place but pt continued to refuse.
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
## 2023-07-03 ENCOUNTER — Other Ambulatory Visit (HOSPITAL_COMMUNITY): Payer: Self-pay

## 2023-07-03 LAB — BASIC METABOLIC PANEL WITH GFR
Anion gap: 10 (ref 5–15)
BUN: 7 mg/dL (ref 6–20)
CO2: 24 mmol/L (ref 22–32)
Calcium: 8.9 mg/dL (ref 8.9–10.3)
Chloride: 105 mmol/L (ref 98–111)
Creatinine, Ser: 0.92 mg/dL (ref 0.61–1.24)
GFR, Estimated: >60 mL/min (ref >60)
Glucose, Bld: 115 mg/dL — ABNORMAL HIGH (ref 70–99)
Potassium: 3.7 mmol/L (ref 3.5–5.1)
Sodium: 139 mmol/L (ref 135–145)

## 2023-07-03 LAB — CBC
HCT: 42.5 % (ref 39.0–52.0)
Hemoglobin: 14 g/dL (ref 13.0–17.0)
MCH: 31.3 pg (ref 26.0–34.0)
MCHC: 32.9 g/dL (ref 30.0–36.0)
MCV: 95.1 fL (ref 80.0–100.0)
Platelets: 207 10*3/uL (ref 150–400)
RBC: 4.47 MIL/uL (ref 4.22–5.81)
RDW: 13.9 % (ref 11.5–15.5)
WBC: 10.4 10*3/uL (ref 4.0–10.5)
nRBC: 0 % (ref 0.0–0.2)

## 2023-07-03 MED ORDER — OXYCODONE HCL 5 MG PO TABS
5.0000 mg | ORAL_TABLET | ORAL | 0 refills | Status: AC | PRN
  Filled 2023-07-03: qty 40, 4d supply, fill #0

## 2023-07-03 MED ORDER — ASPIRIN 81 MG PO CHEW
81.0000 mg | CHEWABLE_TABLET | Freq: Two times a day (BID) | ORAL | 0 refills | Status: AC
Start: 2023-07-03 — End: 2023-08-14
  Filled 2023-07-03: qty 84, 42d supply, fill #0

## 2023-07-03 MED ORDER — ACETAMINOPHEN 500 MG PO TABS
1000.0000 mg | ORAL_TABLET | Freq: Three times a day (TID) | ORAL | 0 refills | Status: AC
  Filled 2023-07-03: qty 84, 14d supply, fill #0

## 2023-07-03 MED ORDER — SENNOSIDES-DOCUSATE SODIUM 8.6-50 MG PO TABS
1.0000 | ORAL_TABLET | Freq: Two times a day (BID) | ORAL | 0 refills | Status: AC
  Filled 2023-07-03: qty 28, 14d supply, fill #0

## 2023-07-03 MED ORDER — METHOCARBAMOL 500 MG PO TABS
500.0000 mg | ORAL_TABLET | Freq: Four times a day (QID) | ORAL | 0 refills | Status: AC
  Filled 2023-07-03: qty 40, 10d supply, fill #0

## 2023-07-03 MED ORDER — POLYETHYLENE GLYCOL 3350 17 GM/SCOOP PO POWD
17.0000 g | Freq: Every day | ORAL | 0 refills | Status: AC
  Filled 2023-07-03: qty 238, 14d supply, fill #0
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
## 2023-07-03 NOTE — Progress Notes (Signed)
 Patient has been asking for pain medication. When nurse goes in to give pain med patient states "what is this"?  And he stalls and will take his time at swallowing the pills.  He complains that the pain medicine is not working.  He wants "the one that goes in the iv".  At 0030, this nurse provided the Dilaudid  iv and he still complained "what's this"? I explained again what medication it is and what is it for.  He states "well it ain't working".  And I responded "I am just now giving it to you.  Give it a chance to work".
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
## 2023-07-03 NOTE — Discharge Instructions (Addendum)
 Orthopedic Surgery Discharge Instructions  Patient name: Chase Ayala Fracture: right open tibia fracture  Procedure Performed: right tibia fracture open reduction internal fixation with intramedullary rod, right open tibia fracture irrigation and debridement Date of Surgery: 07/02/2023 Surgeon: Colette Davies, MD  Activity: You are allowed to put as much weight on your leg as you would like. You can walk as much as you would like. You can perform household activities such as cleaning dishes, doing laundry, vacuuming, etc.  Incision Care: Your incision sites have dressings over them. The dressings should remain in place and dry at all times for a total of one week after surgery. After one week, you can remove the dressings. Underneath the dressings, you will find skin staples and sutures. You should leave these staples and sutures in place. They will be taken out in the office when the wound has healed. Do not pick, rub, or scrub at them. Do not put cream or lotion over the surgical area. After one week and once the dressing is off, it is okay to let soap and water run over your incision. Again, do not pick, scrub, or rub at the staples or sutures when bathing. Do not submerge (e.g., take a bath, swim, go in a hot tub, etc.) until six weeks after surgery. There may be some bloody drainage from the incision into the dressing after surgery. This is normal. You do not need to replace the dressing. Continue to leave it in place for the one week as instructed above. Should the dressing become saturated with blood or drainage, please call the office for further instructions.   Medications: You have been prescribed oxycodone . This is a narcotic pain medication and should only be taken as prescribed. You should not drink alcohol or operate heavy machinery (including driving) while taking this medication. The oxycodone  can cause constipation as a side effect. For that reason, you have been prescribed  senna and miralax . These are both laxatives. You do not need to take this medication if you develop diarrhea. Should you remain constipated even while taking the senna and miralax , please use the miralax  twice daily. Tylenol  has been prescribed to be taken every 8 hours, which will give you additional pain relief. Robaxin  is a muscle relaxer which has been prescribed to you to help with muscle spasms.   You have been prescribed aspirin  as a blood thinner. This medication is to be taken to prevent blood clots. Take 81 milligrams twice daily. You should refrain from using other blood thinners (warfarin, apixaban, plavix, xarelto, etc.) while using the aspirin . You will need to take this medication for a total of 6 weeks after your surgery.   You should not use over-the-counter NSAIDs (ibuprofen , Aleve, Celebrex, naproxen, meloxicam, etc.) for pain relief because aspirin  is a similar medication. There can be side effects including but not limited to kidney injury and ulcers if you take these type of medications with the aspirin .  In order to set expectations for opioid prescriptions, you will only be prescribed opioids for a total of six weeks after surgery and, at two-weeks after surgery, your opioid prescription will start to tapered (decreased dosage and number of pills). If you have ongoing need for opioid medication six weeks after surgery, you will be referred to pain management. If you are already established with a provider that is giving you opioid medications, you should schedule an appointment with them for six weeks after surgery if you feel you are going to need another  prescription. State law only allows for opioid prescriptions one week at a time. If you are running out of opioid medication near the end of the week, please call the office during business hours before running out so I can send you another prescription.   Driving: You should not drive while taking narcotic pain medications. You  should start getting back to driving slowly and you may want to try driving in a parking lot before doing anything more.   Diet: You are safe to resume your regular diet after surgery.   Reasons to Call the Office After Surgery: You should feel free to call the office with any concerns or questions you have in the post-operative period, but you should definitely notify the office if you develop: -shortness of breath, chest pain, or trouble breathing -excessive bleeding, drainage, redness, or swelling around the surgical site -fevers, chills, or pain that is getting worse with each passing day -persistent nausea or vomiting -new weakness in the right leg, new or worsening numbness or tingling in the right leg -other concerns about your surgery  Follow Up Appointments: You have an appointment with Dr. Sulema Endo on 07/21/2023 at 9am. Please arrive on time. The office location and phone number are listed below.   Office Information:  -Colette Davies, MD -Phone number: [phone] -Address: [address]       [place], [place] [address]
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
## 2023-07-04 NOTE — Progress Notes (Signed)
 Patient went to the bathroom and had a shower without letting the RN know.The dressing he had on from yesterdays surgery was all wet and RN had to change it.Also the IV came out which was refixed.The dangers of  bathing and wetting the incision and dressing site was explained to the patient who verbelised underatanding

## 2023-07-05 ENCOUNTER — Encounter (HOSPITAL_COMMUNITY): Payer: Self-pay | Admitting: Orthopedic Surgery

## 2023-07-05 LAB — GLUCOSE, CAPILLARY: Glucose-Capillary: 94 mg/dL (ref 70–99)

## 2023-07-05 NOTE — Plan of Care (Signed)
  Problem: Education: Goal: Knowledge of General Education information will improve Description: Including pain rating scale, medication(s)/side effects and non-pharmacologic comfort measures Outcome: Progressing   Problem: Health Behavior/Discharge Planning: Goal: Ability to manage health-related needs will improve Outcome: Adequate for Discharge   Problem: Clinical Measurements: Goal: Ability to maintain clinical measurements within normal limits will improve Outcome: Adequate for Discharge   Problem: Clinical Measurements: Goal: Ability to maintain clinical measurements within normal limits will improve Outcome: Adequate for Discharge Goal: Will remain free from infection Outcome: Adequate for Discharge Goal: Diagnostic test results will improve Outcome: Adequate for Discharge Goal: Respiratory complications will improve Outcome: Adequate for Discharge Goal: Cardiovascular complication will be avoided Outcome: Adequate for Discharge

## 2023-07-05 NOTE — Plan of Care (Signed)
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
## 2023-07-05 NOTE — TOC CAGE-AID Note (Signed)
 Transition of Care [facility]) - CAGE-AID Screening  Patient Details  Name: Chase Ayala MRN: [id number] Date of Birth: 09/25/1990  Clinical Narrative:  Patient endorses occasional alcohol use, on the weekends and only drinks liquor. Patient also endorses marijuana and cigarette smoking 1ppd. Patient offered resources but denies need for substance abuse resources at this time.  CAGE-AID Screening:   Have You Ever Felt You Ought to Cut Down on Your Drinking or Drug Use?: No Have People Annoyed You By Critizing Your Drinking Or Drug Use?: No Have You Felt Bad Or Guilty About Your Drinking Or Drug Use?: No Have You Ever Had a Drink or Used Drugs First Thing In The Morning to Steady Your Nerves or to Get Rid of a Hangover?: No CAGE-AID Score: 0  Substance Abuse Education Offered: Yes

## 2023-07-06 ENCOUNTER — Other Ambulatory Visit (HOSPITAL_COMMUNITY): Payer: Self-pay

## 2023-07-06 NOTE — Progress Notes (Signed)
 PT Cancellation Note  Patient Details Name: Virgie Chery MRN: [id number] DOB: 1990-07-11   Cancelled Treatment:    Reason Eval/Treat Not Completed: Patient declined, no reason specified. Pt declines the need for PT services at this time. Pt is able to ambulate for household distances with support of RW, appropriate for discharge home when medically ready.   Rexie Catena 07/06/2023, 9:24 AM
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
## 2023-07-06 NOTE — Discharge Planning (Signed)
 Patient alert. IV access removed. Discharge teaching given to patient. Patient verbalized understanding of teaching including medications. Medications picked up from [facility] pharmacy and delivered to the room. Discharge summary placed in discharge packet and left with patient. Patient will be transported home with his girlfriend.

## 2023-07-06 NOTE — Plan of Care (Signed)
   Problem: Clinical Measurements: Goal: Ability to maintain clinical measurements within normal limits will improve Outcome: Progressing   Problem: Nutrition: Goal: Adequate nutrition will be maintained Outcome: Progressing   Problem: Coping: Goal: Level of anxiety will decrease Outcome: Progressing   Problem: Safety: Goal: Ability to remain free from injury will improve Outcome: Progressing

## 2023-07-07 ENCOUNTER — Encounter: Payer: Self-pay | Admitting: Physical Therapy

## 2023-07-07 ENCOUNTER — Other Ambulatory Visit: Payer: Self-pay

## 2023-07-07 ENCOUNTER — Ambulatory Visit: Attending: Orthopedic Surgery | Admitting: Physical Therapy

## 2023-07-07 DIAGNOSIS — R2689 Other abnormalities of gait and mobility: Secondary | ICD-10-CM | POA: Insufficient documentation

## 2023-07-07 DIAGNOSIS — R531 Weakness: Secondary | ICD-10-CM | POA: Insufficient documentation

## 2023-07-07 DIAGNOSIS — M79604 Pain in right leg: Secondary | ICD-10-CM | POA: Diagnosis present

## 2023-07-07 NOTE — Addendum Note (Signed)
 Addended by: Mayme Spearman A on: 07/07/2023 04:14 PM   Modules accepted: Orders
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
## 2023-07-09 ENCOUNTER — Ambulatory Visit: Admitting: Physical Therapy

## 2023-07-09 ENCOUNTER — Encounter: Payer: Self-pay | Admitting: Physical Therapy

## 2023-07-09 DIAGNOSIS — M79604 Pain in right leg: Secondary | ICD-10-CM | POA: Diagnosis not present

## 2023-07-09 DIAGNOSIS — R2689 Other abnormalities of gait and mobility: Secondary | ICD-10-CM
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
## 2023-07-13 ENCOUNTER — Ambulatory Visit: Admitting: Physical Therapy

## 2023-07-13 ENCOUNTER — Encounter: Payer: Self-pay | Admitting: Physical Therapy

## 2023-07-13 DIAGNOSIS — R2689 Other abnormalities of gait and mobility: Secondary | ICD-10-CM

## 2023-07-13 DIAGNOSIS — M79604 Pain in right leg: Secondary | ICD-10-CM | POA: Diagnosis not present
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
## 2023-07-15 ENCOUNTER — Encounter: Payer: Self-pay | Admitting: Physical Therapy

## 2023-07-15 ENCOUNTER — Ambulatory Visit: Attending: Orthopedic Surgery | Admitting: Physical Therapy

## 2023-07-15 DIAGNOSIS — M79604 Pain in right leg: Secondary | ICD-10-CM | POA: Diagnosis present

## 2023-07-15 DIAGNOSIS — R2689 Other abnormalities of gait and mobility: Secondary | ICD-10-CM | POA: Insufficient documentation

## 2023-07-20 ENCOUNTER — Encounter: Payer: Self-pay | Admitting: Physical Therapy

## 2023-07-20 ENCOUNTER — Ambulatory Visit: Admitting: Physical Therapy

## 2023-07-20 DIAGNOSIS — M79604 Pain in right leg: Secondary | ICD-10-CM

## 2023-07-20 DIAGNOSIS — R2689 Other abnormalities of gait and mobility: Secondary | ICD-10-CM
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
## 2023-07-21 ENCOUNTER — Ambulatory Visit (INDEPENDENT_AMBULATORY_CARE_PROVIDER_SITE_OTHER): Admitting: Orthopedic Surgery

## 2023-07-21 ENCOUNTER — Other Ambulatory Visit (INDEPENDENT_AMBULATORY_CARE_PROVIDER_SITE_OTHER): Payer: Self-pay

## 2023-07-21 DIAGNOSIS — S82201B Unspecified fracture of shaft of right tibia, initial encounter for open fracture type I or II: Secondary | ICD-10-CM | POA: Diagnosis not present

## 2023-07-21 DIAGNOSIS — S82401B Unspecified fracture of shaft of right fibula, initial encounter for open fracture type I or II: Secondary | ICD-10-CM
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
## 2023-07-21 NOTE — Progress Notes (Signed)
 Orthopedic Surgery Post-operative Office Visit  Procedure: right open tibia fracture I&D and IMN Date of Surgery: 07/02/2023 (~2 weeks post-op)  Assessment: Patient is a 33 y.o. who is recovering as expected after surgery  Plan: -Operative plans complete -Weight bearing as tolerated right lower extremity -Staples and sutures removed today in office -Okay to let soap/water run over incision but do not submerge -Pain management: Oxycodone  at night, Tylenol  during the day -Continue with PT, can transition off of the walker  -Return to office in 4 weeks, x-rays needed at next visit: AP/lateral right tibia  ___________________________________________________________________________   Subjective: Patient has been at home since discharge from the hospital.  He has been ambulating with a walker.  He is working with outpatient PT.  He feels pain has been getting better with time.  He is still using oxycodone  at night.  He is not using any oxycodone  during the day.  He is taking Tylenol  for pain relief.  He has not noticed any redness or drainage around his incisions.  Objective:  General: no acute distress, appropriate affect Neurologic: alert, answering questions appropriately, following commands Respiratory: unlabored breathing on room air Skin: incisions are well approximated with no erythema, induration, active/expressible drainage  MSK (RLE): EHL/TA/GSC intact, extensor mechanism intact, sensation intact to light touch in sural/saphenous/deep peroneal/superficial peroneal/tibial nerve distributions, foot warm and well-perfused, palpable DP pulse  Imaging: X-rays of the right tibia taken 07/21/2023 were independently reviewed and interpreted, showing intramedullary rod within the tibia.  There are interlocking screws proximally and distally.  No lucency seen around the screws.  No screws backed out.  Tibia fracture alignment appears maintained since immediate postoperative films on  07/02/2023.  There is also a segmental fibula fracture. No interval callus formation seen.   Patient name: Chase Ayala Patient MRN: [id number] Date of visit: 07/21/23

## 2023-07-22 ENCOUNTER — Encounter: Payer: Self-pay | Admitting: Physical Therapy

## 2023-07-22 ENCOUNTER — Ambulatory Visit: Admitting: Physical Therapy

## 2023-07-22 DIAGNOSIS — R2689 Other abnormalities of gait and mobility: Secondary | ICD-10-CM

## 2023-07-22 DIAGNOSIS — M79604 Pain in right leg: Secondary | ICD-10-CM
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
## 2023-07-27 ENCOUNTER — Ambulatory Visit: Admitting: Physical Therapy

## 2023-07-27 ENCOUNTER — Encounter: Payer: Self-pay | Admitting: Physical Therapy

## 2023-07-27 DIAGNOSIS — R2689 Other abnormalities of gait and mobility: Secondary | ICD-10-CM

## 2023-07-27 DIAGNOSIS — M79604 Pain in right leg: Secondary | ICD-10-CM | POA: Diagnosis not present

## 2023-07-29 ENCOUNTER — Encounter: Payer: Self-pay | Admitting: Physical Therapy

## 2023-07-29 ENCOUNTER — Ambulatory Visit: Payer: Self-pay | Admitting: Physical Therapy

## 2023-07-29 DIAGNOSIS — M79604 Pain in right leg: Secondary | ICD-10-CM | POA: Diagnosis not present

## 2023-07-29 DIAGNOSIS — R2689 Other abnormalities of gait and mobility: Secondary | ICD-10-CM
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
## 2023-08-03 ENCOUNTER — Ambulatory Visit: Admitting: Physical Therapy

## 2023-08-03 ENCOUNTER — Encounter: Payer: Self-pay | Admitting: Physical Therapy

## 2023-08-03 DIAGNOSIS — R2689 Other abnormalities of gait and mobility: Secondary | ICD-10-CM

## 2023-08-03 DIAGNOSIS — M79604 Pain in right leg: Secondary | ICD-10-CM | POA: Diagnosis not present

## 2023-08-05 ENCOUNTER — Ambulatory Visit: Payer: Self-pay | Admitting: Physical Therapy

## 2023-08-05 ENCOUNTER — Encounter: Payer: Self-pay | Admitting: Physical Therapy

## 2023-08-05 DIAGNOSIS — M79604 Pain in right leg: Secondary | ICD-10-CM | POA: Diagnosis not present

## 2023-08-05 DIAGNOSIS — R2689 Other abnormalities of gait and mobility: Secondary | ICD-10-CM
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
## 2023-08-11 ENCOUNTER — Encounter: Payer: Self-pay | Admitting: Physical Therapy

## 2023-08-11 ENCOUNTER — Ambulatory Visit: Admitting: Physical Therapy

## 2023-08-11 DIAGNOSIS — M79604 Pain in right leg: Secondary | ICD-10-CM

## 2023-08-11 DIAGNOSIS — R2689 Other abnormalities of gait and mobility: Secondary | ICD-10-CM
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
## 2023-08-13 ENCOUNTER — Encounter: Payer: Self-pay | Admitting: Physical Therapy

## 2023-08-13 ENCOUNTER — Ambulatory Visit: Admitting: Physical Therapy

## 2023-08-13 DIAGNOSIS — M79604 Pain in right leg: Secondary | ICD-10-CM

## 2023-08-13 DIAGNOSIS — R2689 Other abnormalities of gait and mobility: Secondary | ICD-10-CM
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
## 2023-08-16 ENCOUNTER — Telehealth: Payer: Self-pay | Admitting: Physical Therapy

## 2023-08-16 ENCOUNTER — Ambulatory Visit: Attending: Orthopedic Surgery | Admitting: Physical Therapy

## 2023-08-16 DIAGNOSIS — R2689 Other abnormalities of gait and mobility: Secondary | ICD-10-CM | POA: Insufficient documentation

## 2023-08-16 DIAGNOSIS — M79604 Pain in right leg: Secondary | ICD-10-CM | POA: Insufficient documentation
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
## 2023-08-16 NOTE — Telephone Encounter (Signed)
 Called pt re: this morning's missed appt - able to speak with him, confirmed date/time of next appt

## 2023-08-18 ENCOUNTER — Encounter: Payer: Self-pay | Admitting: Physical Therapy

## 2023-08-18 ENCOUNTER — Ambulatory Visit: Admitting: Physical Therapy

## 2023-08-18 DIAGNOSIS — R2689 Other abnormalities of gait and mobility: Secondary | ICD-10-CM

## 2023-08-18 DIAGNOSIS — M79604 Pain in right leg: Secondary | ICD-10-CM | POA: Diagnosis present
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
## 2023-08-25 ENCOUNTER — Ambulatory Visit (INDEPENDENT_AMBULATORY_CARE_PROVIDER_SITE_OTHER): Admitting: Orthopedic Surgery

## 2023-08-25 ENCOUNTER — Other Ambulatory Visit: Payer: Self-pay

## 2023-08-25 ENCOUNTER — Ambulatory Visit: Admitting: Physical Therapy

## 2023-08-25 DIAGNOSIS — S82401B Unspecified fracture of shaft of right fibula, initial encounter for open fracture type I or II: Secondary | ICD-10-CM

## 2023-08-25 DIAGNOSIS — S82201B Unspecified fracture of shaft of right tibia, initial encounter for open fracture type I or II: Secondary | ICD-10-CM

## 2023-08-25 NOTE — Progress Notes (Signed)
 Orthopedic Surgery Post-operative Office Visit   Procedure: right open tibia fracture I&D and IMN Date of Surgery: 07/02/2023 (~6 weeks post-op)   Assessment: Patient is a 33 y.o. who is recovering as expected after surgery   Plan: -Operative plans complete -Weight bearing as tolerated right lower extremity -Okay to submerge wound at this point -Pain management: Tylenol  as needed -Continue to work with PT, work on home exercise program regularly -DVT ppx: can stop the ASA -Return to office in 6 weeks, x-rays needed at next visit: AP/lateral right tibia   ___________________________________________________________________________     Subjective: Patient has noticed improvement since he was last seen in the office.  He is not having as much pain.  He is no longer taking any medications for pain.  He is ambulating without any assistive devices.  He still feels like he is limping on the right side and he is working on that with physical therapy.  He is still going to physical therapy and has a couple more sessions scheduled.  He has not noticed any redness or drainage around any of his incisions.  He is pleased with how he is doing so far.   Objective:   General: no acute distress, appropriate affect Neurologic: alert, answering questions appropriately, following commands Respiratory: unlabored breathing on room air Skin: incisions are well healed with no erythema, induration, active/expressible drainage   MSK (RLE): Knee ROM from 0-120, ankle ROM from 5 degrees of dorsiflexion to 30 degrees plantarflexion, EHL/TA/GSC intact, sensation intact to light touch in sural/saphenous/deep peroneal/superficial peroneal/tibial nerve distributions, foot warm and well-perfused, palpable DP pulse   Imaging: X-rays of the right tibia taken 07/21/2023 were independently reviewed and interpreted, showing intramedullary rod within the tibia.  There are interlocking screws proximally and distally.  No  lucency seen around the screws.  No screws backed out.  Tibia fracture alignment appears maintained since immediate postoperative films on 07/02/2023.  There is also a segmental fibula fracture. No interval callus formation seen.     Patient name: Chase Ayala Patient MRN: [id number] Date of visit: 08/25/23
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
## 2023-08-27 ENCOUNTER — Ambulatory Visit: Admitting: Physical Therapy

## 2023-09-02 ENCOUNTER — Encounter: Payer: Self-pay | Admitting: Physical Therapy

## 2023-09-02 ENCOUNTER — Ambulatory Visit: Payer: Self-pay | Admitting: Physical Therapy

## 2023-09-02 DIAGNOSIS — M79604 Pain in right leg: Secondary | ICD-10-CM

## 2023-09-02 DIAGNOSIS — R2689 Other abnormalities of gait and mobility: Secondary | ICD-10-CM
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
## 2023-09-03 ENCOUNTER — Ambulatory Visit: Payer: Self-pay | Admitting: Physical Therapy

## 2023-09-06 ENCOUNTER — Encounter: Payer: Self-pay | Admitting: Orthopedic Surgery

## 2023-09-06 DIAGNOSIS — S82221E Displaced transverse fracture of shaft of right tibia, subsequent encounter for open fracture type I or II with routine healing: Secondary | ICD-10-CM

## 2023-09-08 ENCOUNTER — Ambulatory Visit

## 2023-09-08 ENCOUNTER — Encounter: Payer: Self-pay | Admitting: Physical Therapy

## 2023-09-08 ENCOUNTER — Ambulatory Visit: Admitting: Physical Therapy

## 2023-09-08 DIAGNOSIS — M79604 Pain in right leg: Secondary | ICD-10-CM

## 2023-09-08 DIAGNOSIS — R2689 Other abnormalities of gait and mobility: Secondary | ICD-10-CM
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
## 2023-09-10 ENCOUNTER — Ambulatory Visit

## 2023-09-10 DIAGNOSIS — M79604 Pain in right leg: Secondary | ICD-10-CM | POA: Diagnosis not present

## 2023-09-10 DIAGNOSIS — R2689 Other abnormalities of gait and mobility: Secondary | ICD-10-CM
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
## 2023-09-15 ENCOUNTER — Ambulatory Visit: Admitting: Physical Therapy

## 2023-09-15 ENCOUNTER — Encounter: Payer: Self-pay | Admitting: Physical Therapy

## 2023-09-15 ENCOUNTER — Ambulatory Visit: Attending: Orthopedic Surgery | Admitting: Physical Therapy

## 2023-09-15 DIAGNOSIS — M79604 Pain in right leg: Secondary | ICD-10-CM | POA: Diagnosis present

## 2023-09-15 DIAGNOSIS — R2689 Other abnormalities of gait and mobility: Secondary | ICD-10-CM | POA: Diagnosis present
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
## 2023-09-21 ENCOUNTER — Encounter

## 2023-09-21 ENCOUNTER — Ambulatory Visit: Admitting: Physical Therapy

## 2023-09-21 ENCOUNTER — Encounter: Payer: Self-pay | Admitting: Physical Therapy

## 2023-09-21 DIAGNOSIS — M79604 Pain in right leg: Secondary | ICD-10-CM | POA: Diagnosis not present

## 2023-09-21 DIAGNOSIS — R2689 Other abnormalities of gait and mobility: Secondary | ICD-10-CM

## 2023-09-24 ENCOUNTER — Ambulatory Visit: Admitting: Physical Therapy

## 2023-09-24 ENCOUNTER — Encounter: Payer: Self-pay | Admitting: Physical Therapy

## 2023-09-24 DIAGNOSIS — M79604 Pain in right leg: Secondary | ICD-10-CM | POA: Diagnosis not present

## 2023-09-24 DIAGNOSIS — R2689 Other abnormalities of gait and mobility: Secondary | ICD-10-CM
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
## 2023-09-29 ENCOUNTER — Ambulatory Visit: Admitting: Physical Therapy

## 2023-09-29 ENCOUNTER — Encounter: Payer: Self-pay | Admitting: Physical Therapy

## 2023-09-29 DIAGNOSIS — R2689 Other abnormalities of gait and mobility: Secondary | ICD-10-CM

## 2023-09-29 DIAGNOSIS — M79604 Pain in right leg: Secondary | ICD-10-CM | POA: Diagnosis not present
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
## 2023-10-01 ENCOUNTER — Ambulatory Visit: Admitting: Physical Therapy

## 2023-10-01 ENCOUNTER — Encounter: Payer: Self-pay | Admitting: Physical Therapy

## 2023-10-01 DIAGNOSIS — M79604 Pain in right leg: Secondary | ICD-10-CM | POA: Diagnosis not present

## 2023-10-01 DIAGNOSIS — R2689 Other abnormalities of gait and mobility: Secondary | ICD-10-CM
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
## 2023-10-04 ENCOUNTER — Ambulatory Visit

## 2023-10-04 ENCOUNTER — Ambulatory Visit: Admitting: Physical Therapy

## 2023-10-04 DIAGNOSIS — R2689 Other abnormalities of gait and mobility: Secondary | ICD-10-CM

## 2023-10-04 DIAGNOSIS — M79604 Pain in right leg: Secondary | ICD-10-CM
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
## 2023-10-06 ENCOUNTER — Ambulatory Visit: Admitting: Orthopedic Surgery

## 2023-10-08 ENCOUNTER — Encounter: Admitting: Physical Therapy

## 2023-10-11 ENCOUNTER — Ambulatory Visit

## 2023-10-11 DIAGNOSIS — M79604 Pain in right leg: Secondary | ICD-10-CM

## 2023-10-11 DIAGNOSIS — R2689 Other abnormalities of gait and mobility: Secondary | ICD-10-CM
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
## 2023-10-13 ENCOUNTER — Ambulatory Visit

## 2023-10-13 ENCOUNTER — Ambulatory Visit: Admitting: Physical Therapy

## 2023-10-13 DIAGNOSIS — M79604 Pain in right leg: Secondary | ICD-10-CM

## 2023-10-13 DIAGNOSIS — R2689 Other abnormalities of gait and mobility: Secondary | ICD-10-CM
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
## 2023-10-18 ENCOUNTER — Ambulatory Visit: Attending: Orthopedic Surgery | Admitting: Physical Therapy

## 2023-10-18 ENCOUNTER — Encounter: Payer: Self-pay | Admitting: Physical Therapy

## 2023-10-18 DIAGNOSIS — R2689 Other abnormalities of gait and mobility: Secondary | ICD-10-CM | POA: Diagnosis present

## 2023-10-18 DIAGNOSIS — M79604 Pain in right leg: Secondary | ICD-10-CM | POA: Insufficient documentation
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
## 2023-10-20 ENCOUNTER — Ambulatory Visit: Admitting: Physical Therapy

## 2023-10-20 ENCOUNTER — Encounter: Payer: Self-pay | Admitting: Physical Therapy

## 2023-10-20 DIAGNOSIS — R2689 Other abnormalities of gait and mobility: Secondary | ICD-10-CM

## 2023-10-20 DIAGNOSIS — M79604 Pain in right leg: Secondary | ICD-10-CM
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
## 2023-10-25 ENCOUNTER — Ambulatory Visit

## 2023-10-28 ENCOUNTER — Ambulatory Visit

## 2023-11-01 ENCOUNTER — Ambulatory Visit: Admitting: Physical Therapy

## 2023-11-04 ENCOUNTER — Encounter

## 2023-11-04 ENCOUNTER — Ambulatory Visit (INDEPENDENT_AMBULATORY_CARE_PROVIDER_SITE_OTHER): Admitting: Orthopedic Surgery

## 2023-11-04 ENCOUNTER — Other Ambulatory Visit (INDEPENDENT_AMBULATORY_CARE_PROVIDER_SITE_OTHER): Payer: Self-pay

## 2023-11-04 DIAGNOSIS — S82221E Displaced transverse fracture of shaft of right tibia, subsequent encounter for open fracture type I or II with routine healing: Secondary | ICD-10-CM | POA: Diagnosis not present

## 2023-11-04 NOTE — Progress Notes (Signed)
 Orthopedic Surgery Post-operative Office Visit   Procedure: right open tibia fracture I&D and IMN Date of Surgery: 07/02/2023 (~4 months post-op)   Assessment: Patient is a 33 y.o. who is doing well after surgery. Is showing callus formation on radiographs   Plan: -Operative plans complete, activity as tolerated -Weight bearing as tolerated right lower extremity -Pain management: tylenol  as needed -DVT ppx: none -Return to office in 2 months, x-rays needed at next visit: AP/lateral right tibia   ___________________________________________________________________________     Subjective: Patient has been doing well since he was last seen. He is not having any pain in the leg. He sometimes gets knee pain. He said he typically notices it when he is drinking on the weekends. Has completed physical therapy. Wants to get back to the gym.    Objective:   General: no acute distress, appropriate affect Neurologic: alert, answering questions appropriately, following commands Respiratory: unlabored breathing on room air Skin: incisions are well healed   MSK (RLE): nonantalgic gait, knee ROM from 0-120, ankle ROM from 5 degrees of dorsiflexion to 30 degrees plantarflexion, EHL/TA/GSC intact, sensation intact to light touch in sural/saphenous/deep peroneal/superficial peroneal/tibial nerve distributions, foot warm and well-perfused, palpable DP pulse   Imaging: X-rays of the right tibia taken 11/04/2023 were independently reviewed and interpreted, showing intramedullary rod within the tibia. Fracture alignment has been maintained. There is periosteal reaction seen around the tibia fracture. Callus formation is noted around the fibula fracture. No new fractures seen.     Patient name: Chase Ayala Patient MRN: [id number] Date of visit: 11/04/23

## 2024-01-05 ENCOUNTER — Ambulatory Visit: Admitting: Orthopedic Surgery

## 2024-01-17 ENCOUNTER — Encounter: Payer: Self-pay | Admitting: Radiology
